# Patient Record
Sex: Male | Born: 1950
Health system: Southern US, Community
[De-identification: ages and names within clinical notes are randomized; demographics above are authoritative.]

## PROBLEM LIST (undated history)

## (undated) DIAGNOSIS — K219 Gastro-esophageal reflux disease without esophagitis: Secondary | ICD-10-CM

## (undated) DIAGNOSIS — M5136 Other intervertebral disc degeneration, lumbar region: Secondary | ICD-10-CM

## (undated) DIAGNOSIS — M1990 Unspecified osteoarthritis, unspecified site: Secondary | ICD-10-CM

## (undated) DIAGNOSIS — E785 Hyperlipidemia, unspecified: Secondary | ICD-10-CM

## (undated) DIAGNOSIS — H409 Unspecified glaucoma: Secondary | ICD-10-CM

## (undated) DIAGNOSIS — Z86010 Personal history of colonic polyps: Secondary | ICD-10-CM

## (undated) DIAGNOSIS — C801 Malignant (primary) neoplasm, unspecified: Secondary | ICD-10-CM

## (undated) DIAGNOSIS — G629 Polyneuropathy, unspecified: Secondary | ICD-10-CM

## (undated) DIAGNOSIS — T7840XA Allergy, unspecified, initial encounter: Secondary | ICD-10-CM

## (undated) DIAGNOSIS — H269 Unspecified cataract: Secondary | ICD-10-CM

## (undated) HISTORY — PX: SMALL INTESTINE SURGERY: SHX150

## (undated) HISTORY — DX: Personal history of colonic polyps: Z86.010

## (undated) HISTORY — PX: TONSILLECTOMY: SUR1361

## (undated) HISTORY — DX: Allergy, unspecified, initial encounter: T78.40XA

## (undated) HISTORY — DX: Unspecified glaucoma: H40.9

## (undated) HISTORY — DX: Other intervertebral disc degeneration, lumbar region: M51.36

## (undated) HISTORY — DX: Hyperlipidemia, unspecified: E78.5

## (undated) HISTORY — DX: Malignant (primary) neoplasm, unspecified: C80.1

## (undated) HISTORY — PX: PROSTATE SURGERY: SHX751

## (undated) HISTORY — PX: COLONOSCOPY: SHX174

## (undated) HISTORY — PX: EYE SURGERY: SHX253

## (undated) HISTORY — DX: Polyneuropathy, unspecified: G62.9

## (undated) HISTORY — DX: Unspecified cataract: H26.9

## (undated) HISTORY — DX: Unspecified osteoarthritis, unspecified site: M19.90

---

## 1980-05-17 HISTORY — PX: VASECTOMY: SHX75

## 2009-05-17 HISTORY — PX: JOINT REPLACEMENT: SHX530

## 2010-05-18 LAB — HM COLONOSCOPY

## 2011-02-19 DIAGNOSIS — Z860100 Personal history of colon polyps, unspecified: Secondary | ICD-10-CM

## 2011-02-19 HISTORY — DX: Personal history of colon polyps, unspecified: Z86.0100

## 2011-02-19 HISTORY — DX: Personal history of colonic polyps: Z86.010

## 2011-03-11 LAB — HM COLONOSCOPY

## 2012-05-17 DIAGNOSIS — T7840XA Allergy, unspecified, initial encounter: Secondary | ICD-10-CM

## 2012-05-17 HISTORY — DX: Allergy, unspecified, initial encounter: T78.40XA

## 2013-05-17 DIAGNOSIS — C801 Malignant (primary) neoplasm, unspecified: Secondary | ICD-10-CM

## 2013-05-17 HISTORY — DX: Malignant (primary) neoplasm, unspecified: C80.1

## 2014-05-17 DIAGNOSIS — H269 Unspecified cataract: Secondary | ICD-10-CM

## 2014-05-17 HISTORY — DX: Unspecified cataract: H26.9

## 2014-08-27 DIAGNOSIS — H4011X3 Primary open-angle glaucoma, severe stage: Secondary | ICD-10-CM | POA: Diagnosis not present

## 2014-10-08 DIAGNOSIS — C61 Malignant neoplasm of prostate: Secondary | ICD-10-CM | POA: Diagnosis not present

## 2014-10-29 ENCOUNTER — Encounter: Payer: Self-pay | Admitting: Physician Assistant

## 2014-10-31 DIAGNOSIS — C61 Malignant neoplasm of prostate: Secondary | ICD-10-CM | POA: Diagnosis not present

## 2014-10-31 DIAGNOSIS — Z8546 Personal history of malignant neoplasm of prostate: Secondary | ICD-10-CM | POA: Diagnosis not present

## 2014-10-31 DIAGNOSIS — N5203 Combined arterial insufficiency and corporo-venous occlusive erectile dysfunction: Secondary | ICD-10-CM | POA: Diagnosis not present

## 2014-11-07 ENCOUNTER — Encounter: Payer: Self-pay | Admitting: Physician Assistant

## 2014-11-07 ENCOUNTER — Ambulatory Visit (INDEPENDENT_AMBULATORY_CARE_PROVIDER_SITE_OTHER): Payer: Medicare Other | Admitting: Physician Assistant

## 2014-11-07 VITALS — BP 110/80 | HR 62 | Temp 98.1°F | Resp 18 | Ht 68.0 in | Wt 184.0 lb

## 2014-11-07 DIAGNOSIS — Z23 Encounter for immunization: Secondary | ICD-10-CM

## 2014-11-07 DIAGNOSIS — H409 Unspecified glaucoma: Secondary | ICD-10-CM | POA: Diagnosis not present

## 2014-11-07 DIAGNOSIS — E785 Hyperlipidemia, unspecified: Secondary | ICD-10-CM

## 2014-11-07 DIAGNOSIS — Z8546 Personal history of malignant neoplasm of prostate: Secondary | ICD-10-CM | POA: Diagnosis not present

## 2014-11-07 HISTORY — DX: Hyperlipidemia, unspecified: E78.5

## 2014-11-07 MED ORDER — SIMVASTATIN 20 MG PO TABS: 20.0000 mg | ORAL_TABLET | Freq: Every day | ORAL | Status: DC

## 2014-11-07 NOTE — Progress Notes (Addendum)
Patient ID: Kyle Wilcox MRN: [id number], DOB: August 27, 1950, 64 y.o. Date of Encounter: @DATE @  Chief Complaint:  Chief Complaint  Patient presents with  . new pt get established    no CPE    HPI: 64 y.o. year old white male  presents with his wife office visit today. They are both here to establish as new patients.  THIS IS ADDED 12/05/2014: I RECEIVED RECORDS FROM [facility]. REVIEWED ALL OF THEM. GAVE TO KIM TO MAKE SURE ALL DIAGNOSIS ARE ABSTRACTED TO PROBLEM LIST AND THAT ALL MEDICATIONS ARE ON MEDICATION LIST.  OTHERWISE, I SAW NO NOTABLE INFORMATION IN THE RECORDS.  RECEIVED LAST LABS DONE 10/03/2013----CMET--NORMAL,  FLP--LDL 121,  THESE ARE THE ONLY LABS FROM THAT DATE ONLY OTHER LABS WE RECEIVED ARE FROM 09/26/2012---CMET--NORMAL, FLP--LDL 100, PSA. NO OTHER LABS.   He just recently moved here from [place] in April 2016.  He was going to [facility]. Says he went there every 3 or 4 months for labs and office visit to monitor his simvastatin. He also had CPE there once a year ---  Last CPE was  September 2015.  He also has a history of prostate cancer and underwent a complete prostatectomy 08/27/2013. Says that he had no chemotherapy or radiation but just the surgical resection. He has continued follow-up with urology every 6 months. Says that he just saw them last week for his second 6 month follow-up visit. They had told him to follow-up 6 months. Today I discussed referring him to a Urologist to establish care here locally and he is agreeable with this.  He also has glaucoma and history of cataracts. He has already established with an eye doctor here. His prior eye doctor actually has an office here so he has been able to follow-up with that same group.  He has no complaints or concerns. Is just here to get established.   Past Medical History  Diagnosis Date  . Allergy 2014  . Cataract 2016  . Glaucoma   . Hyperlipidemia 11/07/2014  . Cancer  2015    prostate cancer     Home Meds: Outpatient Prescriptions Prior to Visit  Medication Sig Dispense Refill  . aspirin (ASPIR-81) 81 MG EC tablet Take 81 mg by mouth daily. Swallow whole.    . Bimatoprost (LUMIGAN OP) Apply 5 mLs to eye 2 (two) times daily.    . Brimonidine Tartrate (ALPHAGAN P OP) Apply 5 mLs to eye 2 (two) times daily.    . brinzolamide (AZOPT) 1 % ophthalmic suspension Place 1 drop into both eyes 2 (two) times daily.    Marland Kitchen gabapentin (NEURONTIN) 600 MG tablet Take 600 mg by mouth 2 (two) times daily.    Marland Kitchen loratadine (CLARITIN) 10 MG tablet Take 10 mg by mouth daily.    . Methylcellulose, Laxative, (FIBER THERAPY PO) Take 0.52 mg by mouth 2 (two) times daily.    . ONE DAILY MULTIPLE VITAMIN PO Take by mouth.    . simvastatin (ZOCOR) 20 MG tablet Take 20 mg by mouth daily.     No facility-administered medications prior to visit.    Allergies: No Known Allergies  History   Social History  . Marital Status: Married    Spouse Name: N/A  . Number of Children: N/A  . Years of Education: N/A   Occupational History  . Not on file.   Social History Main Topics  . Smoking status: Never Smoker   . Smokeless tobacco: Never Used  .  Alcohol Use: 1.2 oz/week    2 Cans of beer per week  . Drug Use: No  . Sexual Activity: Not Currently   Other Topics Concern  . Not on file   Social History Narrative    Family History  Problem Relation Age of Onset  . Arthritis Mother   . Heart disease Mother   . Hyperlipidemia Mother   . Hypertension Mother   . Stroke Mother   . Vision loss Mother   . Arthritis Father   . Depression Father   . Heart disease Father   . Hyperlipidemia Father   . Stroke Father   . Arthritis Sister   . Cancer Sister   . Diabetes Sister   . Hyperlipidemia Sister   . Arthritis Brother   . Diabetes Brother   . Heart disease Brother   . Hyperlipidemia Brother   . Hypertension Brother      Review of Systems:  See HPI for pertinent  ROS. All other ROS negative.    Physical Exam: Blood pressure 110/80, pulse 62, temperature 98.1 F (36.7 C), temperature source Oral, resp. rate 18, height 5\' 8"  (1.727 m), weight 184 lb (83.462 kg)., Body mass index is 27.98 kg/(m^2). General: WNWD WM. Appears in no acute distress. Neck: Supple. No thyromegaly. No lymphadenopathy. No carotid bruits. Lungs: Clear bilaterally to auscultation without wheezes, rales, or rhonchi. Breathing is unlabored. Heart: RRR with S1 S2. No murmurs, rubs, or gallops. Abdomen: Soft, non-tender, non-distended with normoactive bowel sounds. No hepatomegaly. No rebound/guarding. No obvious abdominal masses. Musculoskeletal:  Strength and tone normal for age. Extremities/Skin: Warm and dry. Neuro: Alert and oriented X 3. Moves all extremities spontaneously. Gait is normal. CNII-XII grossly in tact. Psych:  Responds to questions appropriately with a normal affect.     ASSESSMENT AND PLAN:  64 y.o. year old male with    THIS IS ADDED 12/05/2014: I RECEIVED RECORDS FROM [facility]. REVIEWED ALL OF THEM. GAVE TO KIM TO MAKE SURE ALL DIAGNOSIS ARE ABSTRACTED TO PROBLEM LIST AND THAT ALL MEDICATIONS ARE ON MEDICATION LIST.  OTHERWISE, I SAW NO NOTABLE INFORMATION IN THE RECORDS.  RECEIVED LAST LABS DONE 10/03/2013----CMET--NORMAL,  FLP--LDL 121,  THESE ARE THE ONLY LABS FROM THAT DATE ONLY OTHER LABS WE RECEIVED ARE FROM 09/26/2012---CMET--NORMAL, FLP--LDL 100, PSA. NO OTHER LABS.   1. Hyperlipidemia - simvastatin (ZOCOR) 20 MG tablet; Take 1 tablet (20 mg total) by mouth daily.  Dispense: 90 tablet; Refill: 1  2. History of prostate cancer - Ambulatory referral to Urology  3. Glaucoma Managed by Ophthalmology  4. Preventive Care Return around September for complete physical exam. He will come fasting to that appointment. PSA will be followed by Urology  Colorectal Cancer Screening States that last colonoscopy was 2012. Was told to repeat  5 years. Due 2017.  Immunizations: Influenza vaccine------------ States that he receives this each fall Pneumonia vaccine--------- At [place] 11/07/14 states that he has no had no pneumonia vaccine so far. He is now age 38 he is due to start pneumonia vaccines. Discussed this with him today and he is agreeable. Prevnar 13-----Give  Today (11/07/2014--- and give Pneumovax 23 6-12 months later. Tetanus:------------- Pt and wife state that he is overdue for this. However at visit 11/07/14 we are currently out of this. Will give this at next visit. Zostavax----------------------received 04/23/2013  4. Need for vaccination - Pneumococcal conjugate vaccine 13-valent   Signed, [address] [place], [place], [facility] 11/07/2014 11:36 AM

## 2014-11-11 ENCOUNTER — Encounter: Payer: Self-pay | Admitting: Family Medicine

## 2014-11-28 ENCOUNTER — Encounter: Payer: Self-pay | Admitting: Physician Assistant

## 2014-11-29 DIAGNOSIS — N529 Male erectile dysfunction, unspecified: Secondary | ICD-10-CM | POA: Diagnosis not present

## 2014-11-29 DIAGNOSIS — C61 Malignant neoplasm of prostate: Secondary | ICD-10-CM | POA: Diagnosis not present

## 2014-12-24 ENCOUNTER — Encounter: Payer: Self-pay | Admitting: Family Medicine

## 2014-12-24 DIAGNOSIS — M67919 Unspecified disorder of synovium and tendon, unspecified shoulder: Secondary | ICD-10-CM | POA: Insufficient documentation

## 2014-12-24 DIAGNOSIS — M51369 Other intervertebral disc degeneration, lumbar region without mention of lumbar back pain or lower extremity pain: Secondary | ICD-10-CM

## 2014-12-24 DIAGNOSIS — IMO0001 Reserved for inherently not codable concepts without codable children: Secondary | ICD-10-CM | POA: Insufficient documentation

## 2014-12-24 DIAGNOSIS — M5136 Other intervertebral disc degeneration, lumbar region: Secondary | ICD-10-CM

## 2014-12-24 DIAGNOSIS — N529 Male erectile dysfunction, unspecified: Secondary | ICD-10-CM

## 2014-12-24 DIAGNOSIS — G629 Polyneuropathy, unspecified: Secondary | ICD-10-CM

## 2014-12-24 HISTORY — DX: Polyneuropathy, unspecified: G62.9

## 2014-12-24 HISTORY — DX: Other intervertebral disc degeneration, lumbar region: M51.36

## 2014-12-24 HISTORY — DX: Other intervertebral disc degeneration, lumbar region without mention of lumbar back pain or lower extremity pain: M51.369

## 2015-01-15 ENCOUNTER — Encounter: Payer: Self-pay | Admitting: Physician Assistant

## 2015-01-15 ENCOUNTER — Ambulatory Visit (INDEPENDENT_AMBULATORY_CARE_PROVIDER_SITE_OTHER): Payer: Medicare Other | Admitting: Physician Assistant

## 2015-01-15 VITALS — BP 114/80 | HR 68 | Temp 98.1°F | Resp 18 | Wt 184.0 lb

## 2015-01-15 DIAGNOSIS — L255 Unspecified contact dermatitis due to plants, except food: Secondary | ICD-10-CM

## 2015-01-15 MED ORDER — METHYLPREDNISOLONE ACETATE 80 MG/ML IJ SUSP
80.0000 mg | Freq: Once | INTRAMUSCULAR | Status: AC
Start: 2015-01-15 — End: 2015-01-15
  Administered 2015-01-15: 80 mg via INTRAMUSCULAR

## 2015-01-15 NOTE — Addendum Note (Signed)
Addended by: Daylene Posey T on: 01/15/2015 09:33 AM   Modules accepted: Orders

## 2015-01-15 NOTE — Progress Notes (Signed)
    Patient ID: Kyle Wilcox MRN: [id number], DOB: Aug 21, 1950, 64 y.o. Date of Encounter: 01/15/2015, 9:10 AM    Chief Complaint:  Chief Complaint  Patient presents with  . poison ivey    x 2 days on arms, legs and forehead     HPI: 64 y.o. year old white male presents with above.  Says that he has been very allergic to poison ivy/poison oak in the past. Says that he can "just look at it' and break out in itchy rash. Says that he is not sure when or where he came into contact with it this time, but says that he is quite certain that must be what it is.  Has blotches of itchy rash on both of his calves both of his thighs and both of his arms and a spot on his forehead.     Home Meds:   Outpatient Prescriptions Prior to Visit  Medication Sig Dispense Refill  . ALPHAGAN P 0.15 % ophthalmic solution     . aspirin (ASPIR-81) 81 MG EC tablet Take 81 mg by mouth daily. Swallow whole.    . Bimatoprost (LUMIGAN OP) Apply 5 mLs to eye 2 (two) times daily.    . Brimonidine Tartrate (ALPHAGAN P OP) Apply 5 mLs to eye 2 (two) times daily.    . brinzolamide (AZOPT) 1 % ophthalmic suspension Place 1 drop into both eyes 2 (two) times daily.    Marland Kitchen gabapentin (NEURONTIN) 600 MG tablet Take 600 mg by mouth 2 (two) times daily.    Marland Kitchen loratadine (CLARITIN) 10 MG tablet Take 10 mg by mouth daily.    Marland Kitchen LUMIGAN 0.01 % SOLN     . Methylcellulose, Laxative, (FIBER THERAPY PO) Take 0.52 mg by mouth 2 (two) times daily.    . ONE DAILY MULTIPLE VITAMIN PO Take by mouth.    . sildenafil (REVATIO) 20 MG tablet Take 20 mg by mouth 3 (three) times daily.    . simvastatin (ZOCOR) 20 MG tablet Take 1 tablet (20 mg total) by mouth daily. 90 tablet 1   No facility-administered medications prior to visit.    Allergies: No Known Allergies    Review of Systems: See HPI for pertinent ROS. All other ROS negative.    Physical Exam: Blood pressure 114/80, pulse 68, temperature 98.1 F (36.7 C), temperature source  Oral, resp. rate 18, weight 184 lb (83.462 kg)., Body mass index is 27.98 kg/(m^2). General:  WNWD WM. Appears in no acute distress. Neck: Supple. No thyromegaly. No lymphadenopathy. Lungs: Clear bilaterally to auscultation without wheezes, rales, or rhonchi. Breathing is unlabored. Heart: Regular rhythm. No murmurs, rubs, or gallops. Msk:  Strength and tone normal for age. Extremities/Skin: Small approx 2cm diameter "splotches" of pink papules/vessicles---scattered on bilateral calves, bilateral thighs, bilateral arms, and small area on forehead also and on left anterior waist line.  Neuro: Alert and oriented X 3. Moves all extremities spontaneously. Gait is normal. CNII-XII grossly in tact. Psych:  Responds to questions appropriately with a normal affect.     ASSESSMENT AND PLAN:  64 y.o. year old male with  1. Allergic dermatitis due to poison vine He prefers an injection versus pill form of medication. Will give Depo-Medrol 80 mg IM now. Follow up if rash does not resolve.   Signed, [address] [place], [place], [facility] 01/15/2015 9:10 AM

## 2015-01-27 DIAGNOSIS — H4011X3 Primary open-angle glaucoma, severe stage: Secondary | ICD-10-CM | POA: Diagnosis not present

## 2015-03-10 ENCOUNTER — Ambulatory Visit (INDEPENDENT_AMBULATORY_CARE_PROVIDER_SITE_OTHER): Payer: Medicare Other | Admitting: Physician Assistant

## 2015-03-10 ENCOUNTER — Encounter: Payer: Self-pay | Admitting: Family Medicine

## 2015-03-10 ENCOUNTER — Encounter: Payer: Self-pay | Admitting: Physician Assistant

## 2015-03-10 VITALS — BP 102/60 | HR 60 | Temp 98.2°F | Resp 18 | Ht 66.5 in | Wt 183.0 lb

## 2015-03-10 DIAGNOSIS — G6289 Other specified polyneuropathies: Secondary | ICD-10-CM

## 2015-03-10 DIAGNOSIS — Z8546 Personal history of malignant neoplasm of prostate: Secondary | ICD-10-CM | POA: Diagnosis not present

## 2015-03-10 DIAGNOSIS — Z0000 Encounter for general adult medical examination without abnormal findings: Secondary | ICD-10-CM | POA: Diagnosis not present

## 2015-03-10 DIAGNOSIS — E785 Hyperlipidemia, unspecified: Secondary | ICD-10-CM | POA: Diagnosis not present

## 2015-03-10 DIAGNOSIS — N529 Male erectile dysfunction, unspecified: Secondary | ICD-10-CM | POA: Diagnosis not present

## 2015-03-10 DIAGNOSIS — H409 Unspecified glaucoma: Secondary | ICD-10-CM | POA: Diagnosis not present

## 2015-03-10 DIAGNOSIS — Z23 Encounter for immunization: Secondary | ICD-10-CM

## 2015-03-10 DIAGNOSIS — IMO0001 Reserved for inherently not codable concepts without codable children: Secondary | ICD-10-CM

## 2015-03-10 DIAGNOSIS — M67919 Unspecified disorder of synovium and tendon, unspecified shoulder: Secondary | ICD-10-CM

## 2015-03-10 DIAGNOSIS — M5136 Other intervertebral disc degeneration, lumbar region: Secondary | ICD-10-CM

## 2015-03-10 NOTE — Progress Notes (Signed)
Patient ID: Kyle Wilcox MRN: [id number], DOB: Apr 22, 1951 64 y.o. Date of Encounter: 03/10/2015, 10:24 AM    Chief Complaint: Physical (CPE)  HPI: 64 y.o. y/o  white male here for CPE.   THE FOLLOWING IS COPIED FROM HIS INITIAL OV NOTE WITH ME, WHICH WAS 11/07/2014: HPI: 64 y.o. year old white male presents with his wife office visit today. They are both here to establish as new patients.  THIS IS ADDED 12/05/2014: I RECEIVED RECORDS FROM [facility]. REVIEWED ALL OF THEM. GAVE TO KIM TO MAKE SURE ALL DIAGNOSIS ARE ABSTRACTED TO PROBLEM LIST AND THAT ALL MEDICATIONS ARE ON MEDICATION LIST.  OTHERWISE, I SAW NO NOTABLE INFORMATION IN THE RECORDS.  RECEIVED LAST LABS DONE 10/03/2013----CMET--NORMAL, FLP--LDL 121, THESE ARE THE ONLY LABS FROM THAT DATE ONLY OTHER LABS WE RECEIVED ARE FROM 09/26/2012---CMET--NORMAL, FLP--LDL 100, PSA. NO OTHER LABS.   He just recently moved here from [place] in April 2016.  He was going to [facility]. Says he went there every 3 or 4 months for labs and office visit to monitor his simvastatin. He also had CPE there once a year --- Last CPE was September 2015.  He also has a history of prostate cancer and underwent a complete prostatectomy 08/27/2013. Says that he had no chemotherapy or radiation but just the surgical resection. He has continued follow-up with urology every 6 months. Says that he just saw them last week for his second 6 month follow-up visit. They had told him to follow-up 6 months. Today I discussed referring him to a Urologist to establish care here locally and he is agreeable with this.  He also has glaucoma and history of cataracts. He has already established with an eye doctor here. His prior eye doctor actually has an office here so he has been able to follow-up with that same group.  He has no complaints or concerns. Is just here to get established.    03/10/2015: He states that he has gotten him  a part-time job at [employer]. Says-- that way-- he can play golf for free and  also it "gives him some time away from his wife's honeydo list"  Also says that his son went to college at [employer] and then stayed in the [place] area after college. This is why patient and his wife moved here. His son is now married with a 65-year-old child.   Review of Systems: Consitutional: No fever, chills, fatigue, night sweats, lymphadenopathy, or weight changes. Eyes: No visual changes, eye redness, or discharge. ENT/Mouth: Ears: No otalgia, tinnitus, hearing loss, discharge. Nose: No congestion, rhinorrhea, sinus pain, or epistaxis. Throat: No sore throat, post nasal drip, or teeth pain. Cardiovascular: No CP, palpitations, diaphoresis, DOE, edema, orthopnea, PND. Respiratory: No cough, hemoptysis, SOB, or wheezing. Gastrointestinal: No anorexia, dysphagia, reflux, pain, nausea, vomiting, hematemesis, diarrhea, constipation, BRBPR, or melena. Genitourinary: No dysuria, frequency, urgency, hematuria, incontinence, nocturia, decreased urinary stream, discharge, impotence, or testicular pain/masses. Musculoskeletal: No decreased ROM, myalgias, stiffness, joint swelling, or weakness. Skin: No rash, erythema, lesion changes, pain, warmth, jaundice, or pruritis. Neurological: No headache, dizziness, syncope, seizures, tremors, memory loss, coordination problems, or paresthesias. Psychological: No anxiety, depression, hallucinations, SI/HI. Endocrine: No fatigue, polydipsia, polyphagia, polyuria, or known diabetes. All other systems were reviewed and are otherwise negative.  Past Medical History  Diagnosis Date  . Allergy 2014  . Cataract 2016  . Glaucoma   . Hyperlipidemia 11/07/2014  . Cancer ([place]) 2015  prostate cancer  . DDD (degenerative disc disease), lumbar 12/24/2014  . Peripheral neuropathy ([place]) 12/24/2014     Past Surgical History  Procedure Laterality Date  . Eye surgery    .  Joint replacement  2011    total shoulder replacement (Right)  . Vasectomy  1982    Home Meds:  Outpatient Prescriptions Prior to Visit  Medication Sig Dispense Refill  . aspirin (ASPIR-81) 81 MG EC tablet Take 81 mg by mouth daily. Swallow whole.    . Bimatoprost (LUMIGAN OP) Apply 5 mLs to eye 2 (two) times daily.    . Brimonidine Tartrate (ALPHAGAN P OP) Apply 5 mLs to eye 2 (two) times daily.    . brinzolamide (AZOPT) 1 % ophthalmic suspension Place 1 drop into both eyes 2 (two) times daily.    Marland Kitchen gabapentin (NEURONTIN) 600 MG tablet Take 600 mg by mouth 2 (two) times daily.    Marland Kitchen loratadine (CLARITIN) 10 MG tablet Take 10 mg by mouth daily.    . Methylcellulose, Laxative, (FIBER THERAPY PO) Take 0.52 mg by mouth 2 (two) times daily.    . ONE DAILY MULTIPLE VITAMIN PO Take by mouth.    . sildenafil (REVATIO) 20 MG tablet Take 20 mg by mouth 3 (three) times daily.    . simvastatin (ZOCOR) 20 MG tablet Take 1 tablet (20 mg total) by mouth daily. 90 tablet 1  . ALPHAGAN P 0.15 % ophthalmic solution     . LUMIGAN 0.01 % SOLN      No facility-administered medications prior to visit.    Allergies: No Known Allergies  Social History   Social History  . Marital Status: Married    Spouse Name: N/A  . Number of Children: N/A  . Years of Education: N/A   Occupational History  . Not on file.   Social History Main Topics  . Smoking status: Former Smoker    Quit date: 03/09/1981  . Smokeless tobacco: Never Used  . Alcohol Use: 1.2 oz/week    2 Cans of beer per week  . Drug Use: No  . Sexual Activity: Not Currently   Other Topics Concern  . Not on file   Social History Narrative    Family History  Problem Relation Age of Onset  . Arthritis Mother   . Heart disease Mother   . Hyperlipidemia Mother   . Hypertension Mother   . Stroke Mother   . Vision loss Mother   . Arthritis Father   . Depression Father   . Heart disease Father   . Hyperlipidemia Father   . Stroke  Father   . Arthritis Sister   . Cancer Sister   . Diabetes Sister   . Hyperlipidemia Sister   . Arthritis Brother   . Diabetes Brother   . Heart disease Brother   . Hyperlipidemia Brother   . Hypertension Brother     Physical Exam: Blood pressure 102/60, pulse 60, temperature 98.2 F (36.8 C), temperature source Oral, resp. rate 18, height 5' 6.5" (1.689 m), weight 183 lb (83.008 kg).  General: Well developed, well nourished, in no acute distress. HEENT: Normocephalic, atraumatic. Conjunctiva pink, sclera non-icteric. Pupils 2 mm constricting to 1 mm, round, regular, and equally reactive to light and accomodation. EOMI. Internal auditory canal clear. TMs with good cone of light and without pathology. Nasal mucosa pink. Nares are without discharge. No sinus tenderness. Oral mucosa pink. Pharynx without exudate.   Neck: Supple. Trachea midline. No thyromegaly. Full ROM. No  lymphadenopathy. Lungs: Clear to auscultation bilaterally without wheezes, rales, or rhonchi. Breathing is of normal effort and unlabored. Cardiovascular: RRR with S1 S2. No murmurs, rubs, or gallops. Distal pulses 2+ symmetrically. No carotid or abdominal bruits. Abdomen: Soft, non-tender, non-distended with normoactive bowel sounds. No hepatosplenomegaly or masses. No rebound/guarding. No CVA tenderness. No hernias. Rectal: Per Urology Musculoskeletal: Full range of motion and 5/5 strength throughout. Without swelling, atrophy, tenderness, or warmth. Extremities without clubbing, cyanosis, or edema.  Skin: Warm and moist without erythema, ecchymosis, wounds, or rash. The top of his head is bald. He has areas of hyperkeratosis c/w Actinic Keratosis. Also areas of brown "freckles" there. Right chest: Suspicious Nevus--Pt says that this just appeared over the past year. The lesion is approximately 4 mm size and has variable shades of brown coloration. Approximately 4 mm diameter and approximately 3 mm raised. No other  suspicious lesions seen on the skin. Neuro: A+Ox3. CN II-XII grossly intact. Moves all extremities spontaneously. Full sensation throughout. Normal gait. DTR 2+ throughout upper and lower extremities.  Psych:  Responds to questions appropriately with a normal affect.   Assessment/Plan:  64 y.o. y/o white male here for CPE  -1. Visit for preventive health examination  A. Screening Labs: He is not fasting today but states that he can return tomorrow morning fasting for labs. - CBC with Differential/Platelet; Future - COMPLETE METABOLIC PANEL WITH GFR; Future - Lipid panel; Future - TSH; Future - Vit D  25 hydroxy (rtn osteoporosis monitoring); Future   B. Screening For Prostate Cancer: He has history of prostate cancer and is being managed by urology for this. Therefore I will order no PSA here. He reports he had prostate surgery 08/2013  C. Screening For Colorectal Cancer: Last colonoscopy was 03/11/2011. Polyps. Repeat 5 years. Due 2017.   D. Immunizations: Flu---------- he is agreeable to receive influenza vaccine--- given here 03/10/15 Tetanus----he is now 64 and on Medicare and they do not cover this so we'll defer this. Pneumococcal--- at [place] 11/07/14 he reported he had had no pneumonia vaccine at that point. ------------------------------- At that visit we gave Prevnar 13 on 11/07/14. --------------------------------Has not been a full 6 months since that vaccine.  --------------------------------6-12 months after that vaccine we will give the Pneumovax 23.-------------THIS WILL BE DUE AT NEXT OV-------------------- Zostavax------------------- received 04/23/2013   2. Glaucoma Since he has moved to this area, he has established with an ophthalmologist and is seeing a glaucoma specialist here locally----he says that it is a group called [facility] and it is the same group that he saw in the past and they just recently opened a branch here in [place].  3. Hyperlipidemia He  is on simvastatin 20 mg. He is not fasting today but states that he can return tomorrow morning fasting for lab work. - COMPLETE METABOLIC PANEL WITH GFR; Future - Lipid panel; Future  4. History of prostate cancer At his visit with me 11/07/14 I did referral to urology. He states that he has had follow-up with local urologist in [place].  5. Male impotence Per urology see #4 above  6. DDD (degenerative disc disease), lumbar  7. Disorder of rotator cuff, unspecified laterality  8. Other polyneuropathy ([place])  9. Need for prophylactic vaccination and inoculation against influenza - Flu Vaccine QUAD 36+ mos IM  10. Actinic Keratosis----on top of head-----he is to schedule follow-up office visit and will perform cryotherapy. 11. Suspicious Nevus---Chest--------- return for follow-up visit for biopsy.  Subjective:   Patient presents for Medicare  Annual/Subsequent preventive examination.   Review Past Medical/Family/Social: these are all reviewed and documented today.   Risk Factors  Current exercise habits:  He is working at the golf course and plays golf. Also plays with his 69-year-old grandson. Dietary issues discussed: He is aware of low sodium low cholesterol low sugar diet  Cardiac risk factors:  Male, Age, Hyperlipidemia  Depression Screen  (Note: if answer to either of the following is "Yes", a more complete depression screening is indicated)  Over the past two weeks, have you felt down, depressed or hopeless? No Over the past two weeks, have you felt little interest or pleasure in doing things? No Have you lost interest or pleasure in daily life? No Do you often feel hopeless? No Do you cry easily over simple problems? No   Activities of Daily Living  In your present state of health, do you have any difficulty performing the following activities?:  Driving? No  Managing money? No  Feeding yourself? No  Getting from bed to chair? No  Climbing a flight of stairs? No   Preparing food and eating?: No  Bathing or showering? No  Getting dressed: No  Getting to the toilet? No  Using the toilet:No  Moving around from place to place: No  In the past year have you fallen or had a near fall?:No  Are you sexually active? No  Do you have more than one partner? No   Hearing Difficulties: No  Do you often ask people to speak up or repeat themselves? No  Do you experience ringing or noises in your ears? No Do you have difficulty understanding soft or whispered voices? No  Do you feel that you have a problem with memory? No Do you often misplace items? No  Do you feel safe at home? Yes  Cognitive Testing  Alert? Yes Normal Appearance?Yes  Oriented to person? Yes Place? Yes  Time? Yes  Recall of three objects? Yes  Can perform simple calculations? Yes  Displays appropriate judgment?Yes  Can read the correct time from a watch face?Yes   List the Names of Other Physician/Practitioners you currently use:  See HPI  Indicate any recent Medical Services you may have received from other than [facility] providers in the past year (date may be approximate).  See HPI Screening Tests / Date-------------- All of this information is documented above Colonoscopy                     Zostavax  Mammogram---N/A  Influenza Vaccine  Tetanus/tdap    Assessment:    Annual wellness medicare exam   Plan:    During the course of the visit the patient was educated and counseled about appropriate screening and preventive services including:  Screening mammography  Colorectal cancer screening  Shingles vaccine. Prescription given to that she can get the vaccine at the pharmacy or Medicare part D.  Screen + for depression. PHQ- 9 score of 12 (moderate depression). We discussed the options of counseling versus possibly a medication. I encouraged her strongly think about the counseling. She is going through some medical problems currently and her husband is as well Mrs. been very  stressful for her. She says she will think about it. She does have Xanax to use as needed. Though she may benefit from an SSRI for her more depressive type symptoms but she wants to hold off at this time.  I aksed her to please have her cardioloist send records since we have none  on file.  Diet review for nutrition referral? Yes ____ Not Indicated __x__  Patient Instructions (the written plan) was given to the patient.  Medicare Attestation  I have personally reviewed:  The patient's medical and social history  Their use of alcohol, tobacco or illicit drugs  Their current medications and supplements  The patient's functional ability including ADLs,fall risks, home safety risks, cognitive, and hearing and visual impairment  Diet and physical activities  Evidence for depression or mood disorders  The patient's weight, height, BMI, and visual acuity have been recorded in the chart. I have made referrals, counseling, and provided education to the patient based on review of the above and I have provided the patient with a written personalized care plan for preventive services.     He is going to return fasting for lab work. Going to schedule follow-up office visit for cryotherapy to his head and biopsy of suspicious nevus on the chest  Signed:   [address] [place], [place]  03/10/2015 10:24 AM

## 2015-03-11 ENCOUNTER — Other Ambulatory Visit: Payer: Medicare Other

## 2015-03-11 DIAGNOSIS — Z0000 Encounter for general adult medical examination without abnormal findings: Secondary | ICD-10-CM

## 2015-03-11 DIAGNOSIS — E785 Hyperlipidemia, unspecified: Secondary | ICD-10-CM

## 2015-03-11 LAB — CBC WITH DIFFERENTIAL/PLATELET
BASOS ABS: 0 10*3/uL (ref 0.0–0.1)
Basophils Relative: 1 % (ref 0–1)
EOS ABS: 0.1 10*3/uL (ref 0.0–0.7)
EOS PCT: 3 % (ref 0–5)
HEMATOCRIT: 45.2 % (ref 39.0–52.0)
Hemoglobin: 15.5 g/dL (ref 13.0–17.0)
Lymphocytes Relative: 26 % (ref 12–46)
Lymphs Abs: 1.2 10*3/uL (ref 0.7–4.0)
MCH: 30.3 pg (ref 26.0–34.0)
MCHC: 34.3 g/dL (ref 30.0–36.0)
MCV: 88.3 fL (ref 78.0–100.0)
MONO ABS: 0.8 10*3/uL (ref 0.1–1.0)
MPV: 10.3 fL (ref 8.6–12.4)
Monocytes Relative: 18 % — ABNORMAL HIGH (ref 3–12)
NEUTROS PCT: 52 % (ref 43–77)
Neutro Abs: 2.4 10*3/uL (ref 1.7–7.7)
PLATELETS: 194 10*3/uL (ref 150–400)
RBC: 5.12 MIL/uL (ref 4.22–5.81)
RDW: 14.4 % (ref 11.5–15.5)
WBC: 4.7 10*3/uL (ref 4.0–10.5)

## 2015-03-11 LAB — COMPLETE METABOLIC PANEL WITH GFR
ALT: 27 U/L (ref 9–46)
AST: 22 U/L (ref 10–35)
Albumin: 4 g/dL (ref 3.6–5.1)
Alkaline Phosphatase: 76 U/L (ref 40–115)
BUN: 15 mg/dL (ref 7–25)
CALCIUM: 9.6 mg/dL (ref 8.6–10.3)
CHLORIDE: 100 mmol/L (ref 98–110)
CO2: 30 mmol/L (ref 20–31)
CREATININE: 0.97 mg/dL (ref 0.70–1.25)
GFR, Est Non African American: 82 mL/min (ref >=60)
Glucose, Bld: 85 mg/dL (ref 70–99)
POTASSIUM: 4.4 mmol/L (ref 3.5–5.3)
Sodium: 139 mmol/L (ref 135–146)
Total Bilirubin: 0.6 mg/dL (ref 0.2–1.2)
Total Protein: 6.7 g/dL (ref 6.1–8.1)

## 2015-03-11 LAB — LIPID PANEL
CHOL/HDL RATIO: 5 ratio (ref <=5.0)
CHOLESTEROL: 159 mg/dL (ref 125–200)
HDL: 32 mg/dL — ABNORMAL LOW (ref >=40)
LDL CALC: 93 mg/dL (ref <130)
TRIGLYCERIDES: 169 mg/dL — AB (ref <150)
VLDL: 34 mg/dL — AB (ref <30)

## 2015-03-11 LAB — TSH: TSH: 1.829 u[IU]/mL (ref 0.350–4.500)

## 2015-03-12 LAB — VITAMIN D 25 HYDROXY (VIT D DEFICIENCY, FRACTURES): Vit D, 25-Hydroxy: 44 ng/mL (ref 30–100)

## 2015-03-19 ENCOUNTER — Encounter: Payer: Self-pay | Admitting: Physician Assistant

## 2015-03-27 ENCOUNTER — Encounter: Payer: Self-pay | Admitting: Physician Assistant

## 2015-03-27 ENCOUNTER — Ambulatory Visit (INDEPENDENT_AMBULATORY_CARE_PROVIDER_SITE_OTHER): Payer: Medicare Other | Admitting: Physician Assistant

## 2015-03-27 VITALS — BP 126/60 | HR 68 | Temp 98.4°F | Resp 14 | Ht 66.5 in | Wt 183.0 lb

## 2015-03-27 DIAGNOSIS — L821 Other seborrheic keratosis: Secondary | ICD-10-CM | POA: Diagnosis not present

## 2015-03-27 DIAGNOSIS — D229 Melanocytic nevi, unspecified: Secondary | ICD-10-CM

## 2015-03-27 DIAGNOSIS — L570 Actinic keratosis: Secondary | ICD-10-CM | POA: Diagnosis not present

## 2015-03-27 NOTE — Progress Notes (Signed)
    Patient ID: Kyle Wilcox MRN: [id number], DOB: 1951/01/14, 64 y.o. Date of Encounter: 03/27/2015, 9:03 AM    Chief Complaint:  Chief Complaint  Patient presents with  . Actinic Keratosis    on top of head- requesting cryotherapy  . Suspicious Nevus-Chest    biopsy     HPI: 64 y.o. year old white male presents for above.      Home Meds:   Outpatient Prescriptions Prior to Visit  Medication Sig Dispense Refill  . aspirin (ASPIR-81) 81 MG EC tablet Take 81 mg by mouth daily. Swallow whole.    . Bimatoprost (LUMIGAN OP) Apply 5 mLs to eye 2 (two) times daily.    . Brimonidine Tartrate (ALPHAGAN P OP) Apply 5 mLs to eye 2 (two) times daily.    . brinzolamide (AZOPT) 1 % ophthalmic suspension Place 1 drop into both eyes 2 (two) times daily.    Marland Kitchen gabapentin (NEURONTIN) 600 MG tablet Take 600 mg by mouth 2 (two) times daily.    Marland Kitchen loratadine (CLARITIN) 10 MG tablet Take 10 mg by mouth daily.    . Methylcellulose, Laxative, (FIBER THERAPY PO) Take 0.52 mg by mouth 2 (two) times daily.    . ONE DAILY MULTIPLE VITAMIN PO Take by mouth.    . simvastatin (ZOCOR) 20 MG tablet Take 1 tablet (20 mg total) by mouth daily. 90 tablet 1  . sildenafil (REVATIO) 20 MG tablet Take 20 mg by mouth 3 (three) times daily.     No facility-administered medications prior to visit.    Allergies: No Known Allergies    Review of Systems: See HPI for pertinent ROS. All other ROS negative.    Physical Exam: Blood pressure 126/60, pulse 68, temperature 98.4 F (36.9 C), temperature source Oral, resp. rate 14, height 5' 6.5" (1.689 m), weight 183 lb (83.008 kg)., Body mass index is 29.1 kg/(m^2). General: WNWD WM.  Appears in no acute distress. Neck: Supple. No thyromegaly. No lymphadenopathy. Lungs: Clear bilaterally to auscultation without wheezes, rales, or rhonchi. Breathing is unlabored. Heart: Regular rhythm. No murmurs, rubs, or gallops. Msk:  Strength and tone normal for age. Skin:  Top of  head is bald. There are multiple areas of brown pigmentation scattered. There are also multiple areas of hyperkerasosis/scale scattered on top of head.  Anterior Chest, just right of midline: There is a nevus that measures 0.8cm -- borders are assymetric. Color: There are some areas of lighter brown and other areas that are dark brown.  Neuro: Alert and oriented X 3. Moves all extremities spontaneously. Gait is normal. CNII-XII grossly in tact. Psych:  Responds to questions appropriately with a normal affect.     ASSESSMENT AND PLAN:  64 y.o. year old male with  1. Actinic keratosis of scalp Cryotherapy applied to 6 areas of Actinic Keratosis  2. Suspicious nevus Site cleansed with Betadine and then alcohol swab. Site anaesthesized with epi/lidocaine. Shave biopsy performed. Lesion sent to pathology. Only small amount of bleeding which was easily controlled with use of nitric oxide stick. Discussed keeping site clean and dry today and then may shower and gently care for that site. Follow up with patient once we get pathology results. Patient to follow-up if has any problems with this healing. - Pathology   Signed, [facility] [place], [place], [facility] 03/27/2015 9:03 AM

## 2015-03-28 LAB — PATHOLOGY

## 2015-04-13 DIAGNOSIS — J019 Acute sinusitis, unspecified: Secondary | ICD-10-CM | POA: Diagnosis not present

## 2015-05-08 ENCOUNTER — Encounter: Payer: Self-pay | Admitting: Physician Assistant

## 2015-05-09 MED ORDER — GABAPENTIN 600 MG PO TABS: 600.0000 mg | ORAL_TABLET | Freq: Two times a day (BID) | ORAL | Status: DC

## 2015-05-09 NOTE — Telephone Encounter (Signed)
Medication refilled per protocol. 

## 2015-05-27 DIAGNOSIS — H401113 Primary open-angle glaucoma, right eye, severe stage: Secondary | ICD-10-CM | POA: Diagnosis not present

## 2015-05-27 DIAGNOSIS — H401122 Primary open-angle glaucoma, left eye, moderate stage: Secondary | ICD-10-CM | POA: Diagnosis not present

## 2015-05-27 DIAGNOSIS — H25813 Combined forms of age-related cataract, bilateral: Secondary | ICD-10-CM | POA: Diagnosis not present

## 2015-06-04 ENCOUNTER — Other Ambulatory Visit: Payer: Self-pay | Admitting: Family Medicine

## 2015-06-04 DIAGNOSIS — E785 Hyperlipidemia, unspecified: Secondary | ICD-10-CM

## 2015-06-04 MED ORDER — SIMVASTATIN 20 MG PO TABS: 20.0000 mg | ORAL_TABLET | Freq: Every day | ORAL | Status: DC

## 2015-07-31 ENCOUNTER — Ambulatory Visit (INDEPENDENT_AMBULATORY_CARE_PROVIDER_SITE_OTHER): Payer: Medicare Other | Admitting: Physician Assistant

## 2015-07-31 ENCOUNTER — Encounter: Payer: Self-pay | Admitting: Physician Assistant

## 2015-07-31 VITALS — BP 124/66 | HR 80 | Temp 99.0°F | Resp 18 | Wt 189.0 lb

## 2015-07-31 DIAGNOSIS — B349 Viral infection, unspecified: Secondary | ICD-10-CM

## 2015-07-31 DIAGNOSIS — B9789 Other viral agents as the cause of diseases classified elsewhere: Secondary | ICD-10-CM

## 2015-07-31 DIAGNOSIS — J988 Other specified respiratory disorders: Principal | ICD-10-CM

## 2015-07-31 NOTE — Progress Notes (Signed)
Patient ID: Kyle Wilcox MRN: [id number], DOB: 19-Oct-1950, 65 y.o. Date of Encounter: 07/31/2015, 11:39 AM    Chief Complaint:  Chief Complaint  Patient presents with  . sick x 2 days    laryngitis, sinus pressure     HPI: 65 y.o. year old white male presents with above. Says that he is not getting any drainage out of his nose. As he has a little cough secondary to drainage in his throat but no chest congestion. Temperature here is 99 but he has had no other known fevers or chills. Says he has had some hoarseness but no sore throat. Some sinus pressure. Says that they kept his grandson for a day and his grandson had been sick with runny nose, cough and he is quite certain he has gotten sick from him. States that his grandson's symptoms have resolved without antibiotics.     Home Meds:   Outpatient Prescriptions Prior to Visit  Medication Sig Dispense Refill  . aspirin (ASPIR-81) 81 MG EC tablet Take 81 mg by mouth daily. Swallow whole.    . Bimatoprost (LUMIGAN OP) Apply 5 mLs to eye 2 (two) times daily.    . Brimonidine Tartrate (ALPHAGAN P OP) Apply 5 mLs to eye 2 (two) times daily.    . brinzolamide (AZOPT) 1 % ophthalmic suspension Place 1 drop into both eyes 2 (two) times daily.    Marland Kitchen gabapentin (NEURONTIN) 600 MG tablet Take 1 tablet (600 mg total) by mouth 2 (two) times daily. 180 tablet 3  . Methylcellulose, Laxative, (FIBER THERAPY PO) Take 0.52 mg by mouth 2 (two) times daily.    . ONE DAILY MULTIPLE VITAMIN PO Take by mouth.    . sildenafil (REVATIO) 20 MG tablet Take 20 mg by mouth 3 (three) times daily.    . simvastatin (ZOCOR) 20 MG tablet Take 1 tablet (20 mg total) by mouth daily. 90 tablet 1  . loratadine (CLARITIN) 10 MG tablet Take 10 mg by mouth daily. Reported on 07/31/2015     No facility-administered medications prior to visit.    Allergies: No Known Allergies    Review of Systems: See HPI for pertinent ROS. All other ROS negative.    Physical  Exam: Blood pressure 124/66, pulse 80, temperature 99 F (37.2 C), temperature source Oral, resp. rate 18, weight 189 lb (85.73 kg)., Body mass index is 30.05 kg/(m^2). General:  WNWD WM. Appears in no acute distress. HEENT: Normocephalic, atraumatic, eyes without discharge, sclera non-icteric, nares are without discharge. Bilateral auditory canals clear, TM's are without perforation, pearly grey and translucent with reflective cone of light bilaterally. Oral cavity moist, posterior pharynx without exudate, erythema, peritonsillar abscess. No tenderness with percussion to frontal or maxillary sinuses bilaterally.  Neck: Supple. No thyromegaly. No lymphadenopathy. Lungs: Clear bilaterally to auscultation without wheezes, rales, or rhonchi. Breathing is unlabored. Heart: Regular rhythm. No murmurs, rubs, or gallops. Msk:  Strength and tone normal for age. Extremities/Skin: Warm and dry. Neuro: Alert and oriented X 3. Moves all extremities spontaneously. Gait is normal. CNII-XII grossly in tact. Psych:  Responds to questions appropriately with a normal affect.     ASSESSMENT AND PLAN:  65 y.o. year old male with  1. Viral respiratory infection Gave some samples of Norel AD-- To use 1 Q 6 hours prn. For symptom relief.  He is not hoarse at present, but for hoarseness, recommend voice rest. Told him to follow up if fever increases or symptoms worsen significantly or persist greater than  66-10 days.    [address] [place], [place], [facility] 07/31/2015 11:39 AM

## 2015-08-04 ENCOUNTER — Ambulatory Visit (INDEPENDENT_AMBULATORY_CARE_PROVIDER_SITE_OTHER): Payer: Medicare Other | Admitting: Physician Assistant

## 2015-08-04 ENCOUNTER — Encounter: Payer: Self-pay | Admitting: Physician Assistant

## 2015-08-04 VITALS — BP 106/70 | HR 76 | Temp 98.4°F | Resp 18 | Wt 184.0 lb

## 2015-08-04 DIAGNOSIS — B9689 Other specified bacterial agents as the cause of diseases classified elsewhere: Principal | ICD-10-CM

## 2015-08-04 DIAGNOSIS — J988 Other specified respiratory disorders: Secondary | ICD-10-CM | POA: Diagnosis not present

## 2015-08-04 MED ORDER — AZITHROMYCIN 250 MG PO TABS: ORAL_TABLET | ORAL | Status: DC

## 2015-08-04 NOTE — Progress Notes (Signed)
    Patient ID: Kyle Wilcox MRN: [id number], DOB: 11-14-1950, 65 y.o. Date of Encounter: 08/04/2015, 9:37 AM    Chief Complaint:  Chief Complaint  Patient presents with  . still sick from last week    no better     HPI: 65 y.o. year old male presents with above. I reviewed my office note dated 07/31/15. He states that now since then he has been getting a lot of thick dark mucus out of his nose and also is coughing a lot. The cough seems to be secondary to congestion in his chest not just drainage down the throat. Has had no fevers or chills. No sore throat.     Home Meds:   Outpatient Prescriptions Prior to Visit  Medication Sig Dispense Refill  . aspirin (ASPIR-81) 81 MG EC tablet Take 81 mg by mouth daily. Swallow whole.    . Bimatoprost (LUMIGAN OP) Apply 5 mLs to eye 2 (two) times daily.    . Brimonidine Tartrate (ALPHAGAN P OP) Apply 5 mLs to eye 2 (two) times daily.    . brinzolamide (AZOPT) 1 % ophthalmic suspension Place 1 drop into both eyes 2 (two) times daily.    . cetirizine (ZYRTEC) 10 MG tablet Take 10 mg by mouth daily.    Marland Kitchen gabapentin (NEURONTIN) 600 MG tablet Take 1 tablet (600 mg total) by mouth 2 (two) times daily. 180 tablet 3  . Methylcellulose, Laxative, (FIBER THERAPY PO) Take 0.52 mg by mouth 2 (two) times daily.    . ONE DAILY MULTIPLE VITAMIN PO Take by mouth.    . sildenafil (REVATIO) 20 MG tablet Take 20 mg by mouth 3 (three) times daily.    . simvastatin (ZOCOR) 20 MG tablet Take 1 tablet (20 mg total) by mouth daily. 90 tablet 1   No facility-administered medications prior to visit.    Allergies: No Known Allergies    Review of Systems: See HPI for pertinent ROS. All other ROS negative.    Physical Exam: Blood pressure 106/70, pulse 76, temperature 98.4 F (36.9 C), temperature source Oral, resp. rate 18, weight 184 lb (83.462 kg)., Body mass index is 29.26 kg/(m^2). General:  WNWD WM. Appears in no acute distress. HEENT: Normocephalic,  atraumatic, eyes without discharge, sclera non-icteric, nares are without discharge. Bilateral auditory canals clear, TM's are without perforation, pearly grey and translucent with reflective cone of light bilaterally. Oral cavity moist, posterior pharynx without exudate, erythema, peritonsillar abscess.  Neck: Supple. No thyromegaly. No lymphadenopathy. Lungs: Clear bilaterally to auscultation without wheezes, rales, or rhonchi. Breathing is unlabored. Heart: Regular rhythm. No murmurs, rubs, or gallops. Msk:  Strength and tone normal for age. Extremities/Skin: Warm and dry. Neuro: Alert and oriented X 3. Moves all extremities spontaneously. Gait is normal. CNII-XII grossly in tact. Psych:  Responds to questions appropriately with a normal affect.     ASSESSMENT AND PLAN:  65 y.o. year old male with  1. Bacterial respiratory infection He is to take the antibiotic as directed. Follow-up if symptoms do not resolve within 1 week after completion of antibiotic. - azithromycin (ZITHROMAX) 250 MG tablet; Day 1: Take 2 daily. Days 2-5: Take 1 daily.  Dispense: 6 tablet; Refill: 0   Signed, [address] [place], [place], [facility] 08/04/2015 9:37 AM

## 2015-11-26 DIAGNOSIS — H401122 Primary open-angle glaucoma, left eye, moderate stage: Secondary | ICD-10-CM | POA: Diagnosis not present

## 2015-11-26 DIAGNOSIS — H401113 Primary open-angle glaucoma, right eye, severe stage: Secondary | ICD-10-CM | POA: Diagnosis not present

## 2015-11-27 ENCOUNTER — Ambulatory Visit (INDEPENDENT_AMBULATORY_CARE_PROVIDER_SITE_OTHER): Payer: Medicare Other | Admitting: Physician Assistant

## 2015-11-27 ENCOUNTER — Encounter: Payer: Self-pay | Admitting: Physician Assistant

## 2015-11-27 VITALS — BP 122/76 | HR 60 | Temp 98.2°F | Resp 18 | Wt 182.0 lb

## 2015-11-27 DIAGNOSIS — S93401A Sprain of unspecified ligament of right ankle, initial encounter: Secondary | ICD-10-CM

## 2015-11-27 NOTE — Progress Notes (Signed)
Patient ID: Wanye Zheng MRN: [id number], DOB: 1951-02-06, 65 y.o. Date of Encounter: 11/27/2015, 2:39 PM    Chief Complaint:  Chief Complaint  Patient presents with  . left ankle injury 2 weeks ago     HPI: 65 y.o. year old white male presents with above.   Says that one and a half weeks ago he was out playing golf and stepped off the golf cart path--- it was a couple of inches drop. After that, developed pain in his right ankle. Pain from his heel to mid foot arch area. Was limping when he walked. Was still having pain up until yesterday afternoon. However at this point is thinking that he no longer needed this office visit!! Says last night he wrapped it in an Ace wrap and today is noticing no pain--- even when walking and bearing weight!!     Home Meds:   Outpatient Prescriptions Prior to Visit  Medication Sig Dispense Refill  . aspirin (ASPIR-81) 81 MG EC tablet Take 81 mg by mouth daily. Swallow whole.    . Bimatoprost (LUMIGAN OP) Apply 5 mLs to eye 2 (two) times daily.    . brinzolamide (AZOPT) 1 % ophthalmic suspension Place 1 drop into both eyes 2 (two) times daily.    . cetirizine (ZYRTEC) 10 MG tablet Take 10 mg by mouth daily.    Marland Kitchen gabapentin (NEURONTIN) 600 MG tablet Take 1 tablet (600 mg total) by mouth 2 (two) times daily. 180 tablet 3  . Methylcellulose, Laxative, (FIBER THERAPY PO) Take 0.52 mg by mouth 2 (two) times daily.    . ONE DAILY MULTIPLE VITAMIN PO Take by mouth.    . sildenafil (REVATIO) 20 MG tablet Take 20 mg by mouth 3 (three) times daily.    . simvastatin (ZOCOR) 20 MG tablet Take 1 tablet (20 mg total) by mouth daily. 90 tablet 1  . Brimonidine Tartrate (ALPHAGAN P OP) Apply 5 mLs to eye 2 (two) times daily.    Marland Kitchen azithromycin (ZITHROMAX) 250 MG tablet Day 1: Take 2 daily. Days 2-5: Take 1 daily. 6 tablet 0   No facility-administered medications prior to visit.    Allergies: No Known Allergies    Review of Systems: See HPI for pertinent ROS.  All other ROS negative.    Physical Exam: Blood pressure 122/76, pulse 60, temperature 98.2 F (36.8 C), temperature source Oral, resp. rate 18, weight 182 lb (82.555 kg)., Body mass index is 28.94 kg/(m^2). General:  WNWD WM. Appears in no acute distress. Neck: Supple. No thyromegaly. No lymphadenopathy. Lungs: Clear bilaterally to auscultation without wheezes, rales, or rhonchi. Breathing is unlabored. Heart: Regular rhythm. No murmurs, rubs, or gallops. Msk:  Strength and tone normal for age. Right Ankle / Right Foot:  Inspection: There is minimal swelling at the medial malleolus. Otherwise inspection is normal. There is no ecchymosis. Palpation: Says that the area near the medial malleolus is always sore because of past remote injuries. However says that there is some tenderness along the periphery of the posterior arch region towards the heel where those ligaments and tendons insert into those bones.  Range of motion is intact. Does cause some mild discomfort in those same areas--the periphery of the posterior arch region towards the heel where those ligaments and tendons insert. Extremities/Skin: Warm and dry. Neuro: Alert and oriented X 3. Moves all extremities spontaneously. Gait is normal. CNII-XII grossly in tact. Psych:  Responds to questions appropriately with a normal affect.  ASSESSMENT AND PLAN:  65 y.o. year old male with  1. Sprain of ankle, right, initial encounter To continue wrapping with Ace bandages. Elevate and ice. If has pain with weightbearing and needs to avoid weightbearing. If pain reoccurs/increases call and will consider x-ray if needed. As well today I did give him a handout with exercises for ankle sprain. He is to do these exercises to strengthen the ankle after sprain once the pain has resolved. F/U PRN.   [address] [place], [place], [facility] 11/27/2015 2:39 PM

## 2015-12-03 ENCOUNTER — Other Ambulatory Visit: Payer: Self-pay | Admitting: Physician Assistant

## 2015-12-03 ENCOUNTER — Encounter: Payer: Self-pay | Admitting: Physician Assistant

## 2015-12-03 ENCOUNTER — Ambulatory Visit (INDEPENDENT_AMBULATORY_CARE_PROVIDER_SITE_OTHER): Payer: Medicare Other | Admitting: Physician Assistant

## 2015-12-03 ENCOUNTER — Ambulatory Visit
Admission: RE | Admit: 2015-12-03 | Discharge: 2015-12-03 | Disposition: A | Discharge status: Home / Self Care | Payer: Medicare Other | Source: Ambulatory Visit | Attending: Physician Assistant | Admitting: Physician Assistant

## 2015-12-03 VITALS — BP 116/78 | Temp 98.3°F | Wt 184.0 lb

## 2015-12-03 DIAGNOSIS — S99912A Unspecified injury of left ankle, initial encounter: Secondary | ICD-10-CM | POA: Diagnosis not present

## 2015-12-03 DIAGNOSIS — M79672 Pain in left foot: Secondary | ICD-10-CM

## 2015-12-03 DIAGNOSIS — S93402D Sprain of unspecified ligament of left ankle, subsequent encounter: Secondary | ICD-10-CM

## 2015-12-03 DIAGNOSIS — S99922A Unspecified injury of left foot, initial encounter: Secondary | ICD-10-CM | POA: Diagnosis not present

## 2015-12-03 NOTE — Progress Notes (Signed)
Patient ID: Aris Derousse MRN: [id number], DOB: [birth year], 65 y.o. Date of Encounter: 12/03/2015, 3:01 PM    Chief Complaint:  Chief Complaint  Patient presents with  . Office Visit    Left foot     HPI: 65 y.o. year old white male presents with above.   11/27/2015: Says that one and a half weeks ago he was out playing golf and stepped off the golf cart path--- it was a couple of inches drop. After that, developed pain in his right ankle. Pain from his heel to mid foot arch area. Was limping when he walked. Was still having pain up until yesterday afternoon. However at this point is thinking that he no longer needed this office visit!! Says last night he wrapped it in an Ace wrap and today is noticing no pain--- even when walking and bearing weight!! 1. Sprain of ankle, right, initial encounter To continue wrapping with Ace bandages. Elevate and ice. If has pain with weightbearing and needs to avoid weightbearing. If pain reoccurs/increases call and will consider x-ray if needed. As well today I did give him a handout with exercises for ankle sprain. He is to do these exercises to strengthen the ankle after sprain once the pain has resolved. F/U PRN.  12/03/2015: He reports that his foot/ankle remained pretty much pain-free for several days after last office visit. However then says that one morning he woke up and felt pain there again. Says yesterday he did go play golf but he rode a cart and really didn't have a lot of walking. Says that while he was playing golf he was okay but at the end of the day it was slightly worse. Did wrap it and elevated it and applied ice. Asked what his usual activity level is. Says that he usually pretty much stays in the house. Says he lives in a place that does not require any yard work. Asked if he has significant pain with the first steps on his foot first thing in the morning----he says yes----pain is the worst at that time.    Home Meds:     Outpatient Prescriptions Prior to Visit  Medication Sig Dispense Refill  . aspirin (ASPIR-81) 81 MG EC tablet Take 81 mg by mouth daily. Swallow whole.    . Bimatoprost (LUMIGAN OP) Apply 5 mLs to eye 2 (two) times daily.    . brimonidine (ALPHAGAN) 0.2 % ophthalmic solution     . brinzolamide (AZOPT) 1 % ophthalmic suspension Place 1 drop into both eyes 2 (two) times daily.    . cetirizine (ZYRTEC) 10 MG tablet Take 10 mg by mouth daily.    Marland Kitchen gabapentin (NEURONTIN) 600 MG tablet Take 1 tablet (600 mg total) by mouth 2 (two) times daily. 180 tablet 3  . latanoprost (XALATAN) 0.005 % ophthalmic solution     . Methylcellulose, Laxative, (FIBER THERAPY PO) Take 0.52 mg by mouth 2 (two) times daily.    . ONE DAILY MULTIPLE VITAMIN PO Take by mouth.    . sildenafil (REVATIO) 20 MG tablet Take 20 mg by mouth 3 (three) times daily.    . simvastatin (ZOCOR) 20 MG tablet Take 1 tablet (20 mg total) by mouth daily. 90 tablet 1   No facility-administered medications prior to visit.    Allergies: No Known Allergies    Review of Systems: See HPI for pertinent ROS. All other ROS negative.    Physical Exam: Blood pressure 116/78, temperature 98.3 F (36.8 C), weight 184  lb (83.462 kg)., Body mass index is 29.26 kg/(m^2). General:  WNWD WM. Appears in no acute distress. Neck: Supple. No thyromegaly. No lymphadenopathy. Lungs: Clear bilaterally to auscultation without wheezes, rales, or rhonchi. Breathing is unlabored. Heart: Regular rhythm. No murmurs, rubs, or gallops. Msk:  Strength and tone normal for age. Left Ankle / Left Foot:  Inspection: There is no swelling. Inspection is normal. There is no ecchymosis. Palpation: Says that the area near the medial malleolus is always sore because of past remote injuries. There is tenderness along the medial edge of the arch of the foot and medial edge of the arch toward the heel.   Range of motion is intact. Does cause some mild discomfort in those same  areas--the periphery of the posterior arch region towards the heel where those ligaments and tendons insert. Extremities/Skin: Warm and dry. Neuro: Alert and oriented X 3. Moves all extremities spontaneously. Gait is normal. CNII-XII grossly in tact. Psych:  Responds to questions appropriately with a normal affect.     ASSESSMENT AND PLAN:  65 y.o. year old male with   1. Left ankle sprain, subsequent encounter - DG Ankle Complete Left; Future  2. Left foot pain - DG Foot Complete Left; Future  Will obtain x-ray of foot and ankle. I think he definitely has a component of plantar fasciitis. In addition to sprain.  At this point  will obtain x-rays now. At last office visit already gave him handout with exercises regarding ankle sprain. Today I also gave him handout regarding plantar fasciitis. Handout explains the cause of this. Also reviews treatment including heel cups and wearing proper arch support in shoes. Told him not to go barefooted or wear flip-flops. He is to do the stretches on this hand out. Follow-up with him when I get the x-ray reports.    [address] [place], [place], [facility] 12/03/2015 3:01 PM

## 2015-12-22 DIAGNOSIS — M9903 Segmental and somatic dysfunction of lumbar region: Secondary | ICD-10-CM | POA: Diagnosis not present

## 2015-12-22 DIAGNOSIS — M5432 Sciatica, left side: Secondary | ICD-10-CM | POA: Diagnosis not present

## 2015-12-23 DIAGNOSIS — M9903 Segmental and somatic dysfunction of lumbar region: Secondary | ICD-10-CM | POA: Diagnosis not present

## 2015-12-23 DIAGNOSIS — M5432 Sciatica, left side: Secondary | ICD-10-CM | POA: Diagnosis not present

## 2015-12-25 DIAGNOSIS — M9903 Segmental and somatic dysfunction of lumbar region: Secondary | ICD-10-CM | POA: Diagnosis not present

## 2015-12-25 DIAGNOSIS — M5432 Sciatica, left side: Secondary | ICD-10-CM | POA: Diagnosis not present

## 2015-12-29 DIAGNOSIS — M5432 Sciatica, left side: Secondary | ICD-10-CM | POA: Diagnosis not present

## 2015-12-29 DIAGNOSIS — M9903 Segmental and somatic dysfunction of lumbar region: Secondary | ICD-10-CM | POA: Diagnosis not present

## 2015-12-30 DIAGNOSIS — M9903 Segmental and somatic dysfunction of lumbar region: Secondary | ICD-10-CM | POA: Diagnosis not present

## 2015-12-30 DIAGNOSIS — M5432 Sciatica, left side: Secondary | ICD-10-CM | POA: Diagnosis not present

## 2016-01-01 DIAGNOSIS — M9903 Segmental and somatic dysfunction of lumbar region: Secondary | ICD-10-CM | POA: Diagnosis not present

## 2016-01-01 DIAGNOSIS — M5432 Sciatica, left side: Secondary | ICD-10-CM | POA: Diagnosis not present

## 2016-01-05 DIAGNOSIS — M5432 Sciatica, left side: Secondary | ICD-10-CM | POA: Diagnosis not present

## 2016-01-05 DIAGNOSIS — M9903 Segmental and somatic dysfunction of lumbar region: Secondary | ICD-10-CM | POA: Diagnosis not present

## 2016-01-07 DIAGNOSIS — M9903 Segmental and somatic dysfunction of lumbar region: Secondary | ICD-10-CM | POA: Diagnosis not present

## 2016-01-07 DIAGNOSIS — M5432 Sciatica, left side: Secondary | ICD-10-CM | POA: Diagnosis not present

## 2016-01-11 ENCOUNTER — Encounter: Payer: Self-pay | Admitting: Physician Assistant

## 2016-01-12 DIAGNOSIS — M5432 Sciatica, left side: Secondary | ICD-10-CM | POA: Diagnosis not present

## 2016-01-12 DIAGNOSIS — M9903 Segmental and somatic dysfunction of lumbar region: Secondary | ICD-10-CM | POA: Diagnosis not present

## 2016-01-14 DIAGNOSIS — M5432 Sciatica, left side: Secondary | ICD-10-CM | POA: Diagnosis not present

## 2016-01-14 DIAGNOSIS — M9903 Segmental and somatic dysfunction of lumbar region: Secondary | ICD-10-CM | POA: Diagnosis not present

## 2016-02-27 DIAGNOSIS — H401113 Primary open-angle glaucoma, right eye, severe stage: Secondary | ICD-10-CM | POA: Diagnosis not present

## 2016-02-27 DIAGNOSIS — H25813 Combined forms of age-related cataract, bilateral: Secondary | ICD-10-CM | POA: Diagnosis not present

## 2016-03-15 ENCOUNTER — Other Ambulatory Visit: Payer: Medicare Other

## 2016-03-15 DIAGNOSIS — Z0000 Encounter for general adult medical examination without abnormal findings: Secondary | ICD-10-CM | POA: Diagnosis not present

## 2016-03-15 DIAGNOSIS — E785 Hyperlipidemia, unspecified: Secondary | ICD-10-CM | POA: Diagnosis not present

## 2016-03-15 DIAGNOSIS — Z79899 Other long term (current) drug therapy: Secondary | ICD-10-CM

## 2016-03-15 DIAGNOSIS — G619 Inflammatory polyneuropathy, unspecified: Secondary | ICD-10-CM

## 2016-03-15 LAB — TSH: TSH: 2.92 m[IU]/L (ref 0.40–4.50)

## 2016-03-15 LAB — COMPLETE METABOLIC PANEL WITH GFR
ALBUMIN: 4.6 g/dL (ref 3.6–5.1)
ALT: 32 U/L (ref 9–46)
AST: 25 U/L (ref 10–35)
Alkaline Phosphatase: 79 U/L (ref 40–115)
BILIRUBIN TOTAL: 0.6 mg/dL (ref 0.2–1.2)
BUN: 19 mg/dL (ref 7–25)
CALCIUM: 10.4 mg/dL — AB (ref 8.6–10.3)
CO2: 30 mmol/L (ref 20–31)
CREATININE: 1.04 mg/dL (ref 0.70–1.25)
Chloride: 105 mmol/L (ref 98–110)
GFR, EST AFRICAN AMERICAN: 86 mL/min (ref >=60)
GFR, Est Non African American: 74 mL/min (ref >=60)
Glucose, Bld: 88 mg/dL (ref 70–99)
Potassium: 5.3 mmol/L (ref 3.5–5.3)
Sodium: 146 mmol/L (ref 135–146)
TOTAL PROTEIN: 7.3 g/dL (ref 6.1–8.1)

## 2016-03-15 LAB — CBC WITH DIFFERENTIAL/PLATELET
BASOS PCT: 1 %
Basophils Absolute: 56 cells/uL (ref 0–200)
EOS ABS: 168 {cells}/uL (ref 15–500)
Eosinophils Relative: 3 %
HEMATOCRIT: 48.5 % (ref 38.5–50.0)
Hemoglobin: 16.5 g/dL (ref 13.0–17.0)
LYMPHS PCT: 34 %
Lymphs Abs: 1904 cells/uL (ref 850–3900)
MCH: 30.5 pg (ref 27.0–33.0)
MCHC: 34 g/dL (ref 32.0–36.0)
MCV: 89.6 fL (ref 80.0–100.0)
MONO ABS: 616 {cells}/uL (ref 200–950)
MONOS PCT: 11 %
MPV: 10.4 fL (ref 7.5–12.5)
NEUTROS ABS: 2856 {cells}/uL (ref 1500–7800)
Neutrophils Relative %: 51 %
PLATELETS: 228 10*3/uL (ref 140–400)
RBC: 5.41 MIL/uL (ref 4.20–5.80)
RDW: 14.4 % (ref 11.0–15.0)
WBC: 5.6 10*3/uL (ref 3.8–10.8)

## 2016-03-15 LAB — LIPID PANEL
CHOL/HDL RATIO: 5.5 ratio — AB (ref <=5.0)
CHOLESTEROL: 205 mg/dL — AB (ref 125–200)
HDL: 37 mg/dL — ABNORMAL LOW (ref >=40)
LDL Cholesterol: 136 mg/dL — ABNORMAL HIGH (ref <130)
Triglycerides: 159 mg/dL — ABNORMAL HIGH (ref <150)
VLDL: 32 mg/dL — ABNORMAL HIGH (ref <30)

## 2016-03-17 ENCOUNTER — Encounter: Payer: Self-pay | Admitting: Physician Assistant

## 2016-03-17 ENCOUNTER — Ambulatory Visit (INDEPENDENT_AMBULATORY_CARE_PROVIDER_SITE_OTHER): Payer: Medicare Other | Admitting: Physician Assistant

## 2016-03-17 ENCOUNTER — Telehealth: Payer: Self-pay | Admitting: Internal Medicine

## 2016-03-17 VITALS — BP 118/70 | HR 65 | Temp 97.9°F | Resp 16 | Ht 67.0 in | Wt 183.0 lb

## 2016-03-17 DIAGNOSIS — Z8546 Personal history of malignant neoplasm of prostate: Secondary | ICD-10-CM | POA: Diagnosis not present

## 2016-03-17 DIAGNOSIS — Z0000 Encounter for general adult medical examination without abnormal findings: Secondary | ICD-10-CM | POA: Diagnosis not present

## 2016-03-17 DIAGNOSIS — E785 Hyperlipidemia, unspecified: Secondary | ICD-10-CM

## 2016-03-17 DIAGNOSIS — Z23 Encounter for immunization: Secondary | ICD-10-CM | POA: Diagnosis not present

## 2016-03-17 DIAGNOSIS — Z8249 Family history of ischemic heart disease and other diseases of the circulatory system: Secondary | ICD-10-CM | POA: Diagnosis not present

## 2016-03-17 DIAGNOSIS — M722 Plantar fascial fibromatosis: Secondary | ICD-10-CM | POA: Diagnosis not present

## 2016-03-17 NOTE — Addendum Note (Signed)
Addended by: Vonna Kotyk A on: 03/17/2016 12:27 PM   Modules accepted: Orders

## 2016-03-17 NOTE — Progress Notes (Signed)
Patient ID: Woodward Teply MRN: [id number], DOB: 10/23/1950 65 y.o. Date of Encounter: 03/17/2016, 8:51 AM    Chief Complaint: Physical (CPE)  HPI: 65 y.o. y/o  white male here for CPE.    THE FOLLOWING IS COPIED FROM HIS INITIAL OV NOTE WITH ME, WHICH WAS 11/07/2014: HPI: 65 y.o. year old white male presents with his wife office visit today. They are both here to establish as new patients.  THIS IS ADDED 12/05/2014: I RECEIVED RECORDS FROM [facility]. REVIEWED ALL OF THEM. GAVE TO KIM TO MAKE SURE ALL DIAGNOSIS ARE ABSTRACTED TO PROBLEM LIST AND THAT ALL MEDICATIONS ARE ON MEDICATION LIST.  OTHERWISE, I SAW NO NOTABLE INFORMATION IN THE RECORDS.  RECEIVED LAST LABS DONE 10/03/2013----CMET--NORMAL, FLP--LDL 121, THESE ARE THE ONLY LABS FROM THAT DATE ONLY OTHER LABS WE RECEIVED ARE FROM 09/26/2012---CMET--NORMAL, FLP--LDL 100, PSA. NO OTHER LABS.   He just recently moved here from [place] in April 2016.  He was going to [facility]. Says he went there every 3 or 4 months for labs and office visit to monitor his simvastatin. He also had CPE there once a year --- Last CPE was September 2015.  He also has a history of prostate cancer and underwent a complete prostatectomy 08/27/2013. Says that he had no chemotherapy or radiation but just the surgical resection. He has continued follow-up with urology every 6 months. Says that he just saw them last week for his second 6 month follow-up visit. They had told him to follow-up 6 months. Today I discussed referring him to a Urologist to establish care here locally and he is agreeable with this.  He also has glaucoma and history of cataracts. He has already established with an eye doctor here. His prior eye doctor actually has an office here so he has been able to follow-up with that same group.  He has no complaints or concerns. Is just here to get established.    03/10/2015: He states that he has gotten him  a part-time job at [employer]. Says-- that way-- he can play golf for free and  also it "gives him some time away from his wife's honeydo list"  Also says that his son went to college at [employer] and then stayed in the [place] area after college. This is why patient and his wife moved here. His son is now married with a 110-year-old child.    03/17/2016: He states that he has quit working part-time at [employer] course. Says it was too much bending and too much cold weather. Says he has been having problems with plantar fasciitis of his left foot. Says that in the past I have given him a handout with some stretches to do and had also discussed using shoes with proper arch support and heel cups etc. Says that he has been doing all of this but is still having problems with the left plantar fasciitis. He is still seeing Urology for routine follow-up. Also notes that his father had AAA aneurysm and he would like to get screening test for this. No other specific complaints or concerns today. He is still taking his simvastatin as directed. No myalgias or other adverse effects.  Review of Systems: Consitutional: No fever, chills, fatigue, night sweats, lymphadenopathy, or weight changes. Eyes: No visual changes, eye redness, or discharge. ENT/Mouth: Ears: No otalgia, tinnitus, hearing loss, discharge. Nose: No congestion, rhinorrhea, sinus pain, or epistaxis. Throat: No sore throat, post nasal drip, or  teeth pain. Cardiovascular: No CP, palpitations, diaphoresis, DOE, edema, orthopnea, PND. Respiratory: No cough, hemoptysis, SOB, or wheezing. Gastrointestinal: No anorexia, dysphagia, reflux, pain, nausea, vomiting, hematemesis, diarrhea, constipation, BRBPR, or melena. Genitourinary: No dysuria, frequency, urgency, hematuria, incontinence, nocturia, decreased urinary stream, discharge, impotence, or testicular pain/masses. Musculoskeletal: Positive for Plantar Afasciitis. Full ROm in all  joints.  Skin: No rash, erythema, lesion changes, pain, warmth, jaundice, or pruritis. Neurological: No headache, dizziness, syncope, seizures, tremors, memory loss, coordination problems, or paresthesias. Psychological: No anxiety, depression, hallucinations, SI/HI. Endocrine: No fatigue, polydipsia, polyphagia, polyuria, or known diabetes. All other systems were reviewed and are otherwise negative.  Past Medical History:  Diagnosis Date  . Allergy 2014  . Cancer [facility]) 2015   prostate cancer  . Cataract 2016  . DDD (degenerative disc disease), lumbar 12/24/2014  . Glaucoma   . Hyperlipidemia 11/07/2014  . Peripheral neuropathy ([place]) 12/24/2014     Past Surgical History:  Procedure Laterality Date  . EYE SURGERY    . JOINT REPLACEMENT  2011   total shoulder replacement (Right)  . VASECTOMY  1982    Home Meds:  Outpatient Medications Prior to Visit  Medication Sig Dispense Refill  . aspirin (ASPIR-81) 81 MG EC tablet Take 81 mg by mouth daily. Swallow whole.    . Bimatoprost (LUMIGAN OP) Apply 5 mLs to eye 2 (two) times daily.    . brimonidine (ALPHAGAN) 0.2 % ophthalmic solution     . brinzolamide (AZOPT) 1 % ophthalmic suspension Place 1 drop into both eyes 2 (two) times daily.    . cetirizine (ZYRTEC) 10 MG tablet Take 10 mg by mouth daily.    Marland Kitchen gabapentin (NEURONTIN) 600 MG tablet Take 1 tablet (600 mg total) by mouth 2 (two) times daily. 180 tablet 3  . latanoprost (XALATAN) 0.005 % ophthalmic solution     . Methylcellulose, Laxative, (FIBER THERAPY PO) Take 0.52 mg by mouth 2 (two) times daily.    . ONE DAILY MULTIPLE VITAMIN PO Take by mouth.    . sildenafil (REVATIO) 20 MG tablet Take 20 mg by mouth 3 (three) times daily.    . simvastatin (ZOCOR) 20 MG tablet TAKE 1 TABLET DAILY 90 tablet 1   No facility-administered medications prior to visit.     Allergies: No Known Allergies  Social History   Social History  . Marital status: Married    Spouse name: N/A  .  Number of children: N/A  . Years of education: N/A   Occupational History  . Not on file.   Social History Main Topics  . Smoking status: Former Smoker    Quit date: 03/09/1981  . Smokeless tobacco: Never Used  . Alcohol use 1.2 oz/week    2 Cans of beer per week  . Drug use: No  . Sexual activity: Not Currently   Other Topics Concern  . Not on file   Social History Narrative  . No narrative on file    Family History  Problem Relation Age of Onset  . Arthritis Mother   . Heart disease Mother   . Hyperlipidemia Mother   . Hypertension Mother   . Stroke Mother   . Vision loss Mother   . Arthritis Father   . Depression Father   . Heart disease Father   . Hyperlipidemia Father   . Stroke Father   . Arthritis Sister   . Cancer Sister   . Diabetes Sister   . Hyperlipidemia Sister   . Arthritis Brother   .  Diabetes Brother   . Heart disease Brother   . Hyperlipidemia Brother   . Hypertension Brother     Physical Exam: Blood pressure 118/70, pulse 65, temperature 97.9 F (36.6 C), temperature source Oral, resp. rate 16, height 5\' 7"  (1.702 m), weight 183 lb (83 kg), SpO2 98 %.  General: Well developed, well nourished, in no acute distress. HEENT: Normocephalic, atraumatic. Conjunctiva pink, sclera non-icteric. Pupils 2 mm constricting to 1 mm, round, regular, and equally reactive to light and accomodation. EOMI. Internal auditory canal clear. TMs with good cone of light and without pathology. Nasal mucosa pink. Nares are without discharge. No sinus tenderness. Oral mucosa pink. Pharynx without exudate.   Neck: Supple. Trachea midline. No thyromegaly. Full ROM. No lymphadenopathy. Lungs: Clear to auscultation bilaterally without wheezes, rales, or rhonchi. Breathing is of normal effort and unlabored. Cardiovascular: RRR with S1 S2. No murmurs, rubs, or gallops. Distal pulses 2+ symmetrically. No carotid or abdominal bruits. Abdomen: Soft, non-tender, non-distended with  normoactive bowel sounds. No hepatosplenomegaly or masses. No rebound/guarding. No CVA tenderness. No hernias. Rectal: Per Urology Musculoskeletal: Full range of motion and 5/5 strength throughout. Without swelling, atrophy, tenderness, or warmth. Extremities without clubbing, cyanosis, or edema.  Skin: Warm and moist without erythema, ecchymosis, wounds, or rash. Neuro: A+Ox3. CN II-XII grossly intact. Moves all extremities spontaneously. Full sensation throughout. Normal gait. DTR 2+ throughout upper and lower extremities.  Psych:  Responds to questions appropriately with a normal affect.   Assessment/Plan:  65 y.o. y/o white male here for CPE  -1. Visit for preventive health examination  A. Screening Labs: He recently came and had fasting labs.  Reviewed all of these results with him today. All of these labs are good.  B. Screening For Prostate Cancer: He has history of prostate cancer and is being managed by urology for this. Therefore I will order no PSA here. He reports he had prostate surgery 08/2013  C. Screening For Colorectal Cancer: Last colonoscopy was 03/11/2011. Polyps. Repeat 5 years. Due 2017.  At CPE 03/17/2016 I placed referral to GI (He says prior colonoscopy was performed prior to moving here--will establish with a new GI here )  D. Immunizations: Flu---------- he is agreeable to receive influenza vaccine--- given here 03/10/15. 03/17/2016--he reports that he already received this at the local pharmacy Tetanus----he is now 43 and on Medicare and they do not cover this so we'll defer this. Pneumococcal--- at [place] 11/07/14 he reported he had had no pneumonia vaccine at that point. ------------------------------- At that visit we gave Prevnar 13 on 11/07/14. --------------------------------Has not been a full 6 months since that vaccine.  --------------------------------Pneumovax 23----Given here 03/17/2016 Zostavax------------------- received 04/23/2013  Medicare annual  wellness visit, subsequent At visit 03/17/2016 I have placed the following referral/orders: - Ambulatory referral to Gastroenterology - US Aorta Initial Medicare Screen; Future   Family history of abdominal aortic aneurysm (AAA) 03/17/2016---he reports that his father had abdominal aortic aneurysm. Will obtain screening for this. - US Aorta Initial Medicare Screen; Future  Plantar fasciitis of left foot 03/17/2016---He has been compliant with all of the treatments that I have provided that is still having symptoms. At this time we'll refer him to podiatry. - Ambulatory referral to Podiatry   Glaucoma Since he has moved to this area, he has established with an ophthalmologist and is seeing a glaucoma specialist here locally----he says that it is a group called [facility] and it is the same group that he saw in the past and they  just recently opened a branch here in [place]. 03/17/2016--- continues to follow-up with local ophthalmologist  Hyperlipidemia 03/17/2016---He is on simvastatin 20 mg. 03/17/2016-----He had recent FLP and LFTs which were good. Continue current dose.  History of prostate cancer At his visit with me 11/07/14 I did referral to urology. He states that he has had follow-up with local urologist in [place]. 03/17/2016----he continues to follow-up with urology routinely. Male impotence Per urology see #4 above  DDD (degenerative disc disease), lumbar  Disorder of rotator cuff, unspecified laterality  Other polyneuropathy ([place])   Subjective:   Patient presents for Medicare Annual/Subsequent preventive examination.   Review Past Medical/Family/Social: these are all reviewed and documented today.   Risk Factors  Current exercise habits:  He is working at the golf course and plays golf. Also plays with his 68-year-old grandson. Dietary issues discussed: He is aware of low sodium low cholesterol low sugar diet  Cardiac risk factors:  Male, Age,  Hyperlipidemia  Depression Screen  (Note: if answer to either of the following is "Yes", a more complete depression screening is indicated)  Over the past two weeks, have you felt down, depressed or hopeless? No Over the past two weeks, have you felt little interest or pleasure in doing things? No Have you lost interest or pleasure in daily life? No Do you often feel hopeless? No Do you cry easily over simple problems? No   Activities of Daily Living  In your present state of health, do you have any difficulty performing the following activities?:  Driving? No  Managing money? No  Feeding yourself? No  Getting from bed to chair? No  Climbing a flight of stairs? No  Preparing food and eating?: No  Bathing or showering? No  Getting dressed: No  Getting to the toilet? No  Using the toilet:No  Moving around from place to place: No  In the past year have you fallen or had a near fall?:No  Are you sexually active? No  Do you have more than one partner? No   Hearing Difficulties: No  Do you often ask people to speak up or repeat themselves? No  Do you experience ringing or noises in your ears? No Do you have difficulty understanding soft or whispered voices? No  Do you feel that you have a problem with memory? No Do you often misplace items? No  Do you feel safe at home? Yes  Cognitive Testing  Alert? Yes Normal Appearance?Yes  Oriented to person? Yes Place? Yes  Time? Yes  Recall of three objects? Yes  Can perform simple calculations? Yes  Displays appropriate judgment?Yes  Can read the correct time from a watch face?Yes   List the Names of Other Physician/Practitioners you currently use:  See HPI  Indicate any recent Medical Services you may have received from other than [facility] providers in the past year (date may be approximate).  See HPI Screening Tests / Date-------------- All of this information is documented above Colonoscopy                     Zostavax   Mammogram---N/A  Influenza Vaccine  Tetanus/tdap    Assessment:    Annual wellness medicare exam   Plan:    During the course of the visit the patient was educated and counseled about appropriate screening and preventive services including:  Screening mammography  Colorectal cancer screening  Shingles vaccine. Prescription given to that she can get the vaccine at the pharmacy or Medicare part  D.  Screen + for depression. PHQ- 9 score of 12 (moderate depression). We discussed the options of counseling versus possibly a medication. I encouraged her strongly think about the counseling. She is going through some medical problems currently and her husband is as well Mrs. been very stressful for her. She says she will think about it. She does have Xanax to use as needed. Though she may benefit from an SSRI for her more depressive type symptoms but she wants to hold off at this time.  I aksed her to please have her cardioloist send records since we have none on file.  Diet review for nutrition referral? Yes ____ Not Indicated __x__  Patient Instructions (the written plan) was given to the patient.  Medicare Attestation  I have personally reviewed:  The patient's medical and social history  Their use of alcohol, tobacco or illicit drugs  Their current medications and supplements  The patient's functional ability including ADLs,fall risks, home safety risks, cognitive, and hearing and visual impairment  Diet and physical activities  Evidence for depression or mood disorders  The patient's weight, height, BMI, and visual acuity have been recorded in the chart. I have made referrals, counseling, and provided education to the patient based on review of the above and I have provided the patient with a written personalized care plan for preventive services.     He is going to return fasting for lab work. Going to schedule follow-up office visit for cryotherapy to his head and biopsy of  suspicious nevus on the chest  Signed:   [address] [place], [place]  03/17/2016 8:51 AM

## 2016-03-19 ENCOUNTER — Encounter: Payer: Self-pay | Admitting: Internal Medicine

## 2016-03-24 ENCOUNTER — Ambulatory Visit (INDEPENDENT_AMBULATORY_CARE_PROVIDER_SITE_OTHER): Payer: Medicare Other | Admitting: Physician Assistant

## 2016-03-24 ENCOUNTER — Encounter: Payer: Self-pay | Admitting: Physician Assistant

## 2016-03-24 VITALS — BP 118/82 | HR 51 | Temp 97.8°F | Resp 16 | Ht 67.0 in | Wt 185.0 lb

## 2016-03-24 DIAGNOSIS — L918 Other hypertrophic disorders of the skin: Secondary | ICD-10-CM | POA: Diagnosis not present

## 2016-03-24 DIAGNOSIS — L919 Hypertrophic disorder of the skin, unspecified: Secondary | ICD-10-CM | POA: Diagnosis not present

## 2016-03-24 MED ORDER — GABAPENTIN 600 MG PO TABS: 600.0000 mg | ORAL_TABLET | Freq: Two times a day (BID) | ORAL | 3 refills | Status: DC

## 2016-03-24 MED ORDER — SIMVASTATIN 20 MG PO TABS: 20.0000 mg | ORAL_TABLET | Freq: Every day | ORAL | 1 refills | Status: DC

## 2016-03-24 NOTE — Addendum Note (Signed)
Addended by: Dena Billet on: 03/24/2016 10:00 AM   Modules accepted: Orders

## 2016-03-24 NOTE — Progress Notes (Signed)
    Patient ID: Cheskel Quispe MRN: [id number], DOB: 1950/09/21, 65 y.o. Date of Encounter: 03/24/2016, 8:20 AM    Chief Complaint:  Chief Complaint  Patient presents with  . skin tag on right ear     HPI: 65 y.o. year old male presents for above.   He was recently here for his physical. At that visit he reported he had a skin tag on the back of his right ear that was very irritating. Returns today to have this removed. No other complaints or concerns today.     Home Meds:   Outpatient Medications Prior to Visit  Medication Sig Dispense Refill  . aspirin (ASPIR-81) 81 MG EC tablet Take 81 mg by mouth daily. Swallow whole.    . Bimatoprost (LUMIGAN OP) Apply 5 mLs to eye 2 (two) times daily.    . brimonidine (ALPHAGAN) 0.2 % ophthalmic solution     . brinzolamide (AZOPT) 1 % ophthalmic suspension Place 1 drop into both eyes 2 (two) times daily.    . cetirizine (ZYRTEC) 10 MG tablet Take 10 mg by mouth daily.    Marland Kitchen gabapentin (NEURONTIN) 600 MG tablet Take 1 tablet (600 mg total) by mouth 2 (two) times daily. 180 tablet 3  . latanoprost (XALATAN) 0.005 % ophthalmic solution     . ONE DAILY MULTIPLE VITAMIN PO Take by mouth.    . simvastatin (ZOCOR) 20 MG tablet TAKE 1 TABLET DAILY 90 tablet 1  . Methylcellulose, Laxative, (FIBER THERAPY PO) Take 0.52 mg by mouth 2 (two) times daily.    . sildenafil (REVATIO) 20 MG tablet Take 20 mg by mouth 3 (three) times daily.     No facility-administered medications prior to visit.     Allergies: No Known Allergies    Review of Systems: See HPI for pertinent ROS. All other ROS negative.    Physical Exam: Blood pressure 118/82, pulse (!) 51, temperature 97.8 F (36.6 C), temperature source Oral, resp. rate 16, height 5\' 7"  (1.702 m), weight 185 lb (83.9 kg), SpO2 98 %., Body mass index is 28.98 kg/m. General:  WNWD WM. Appears in no acute distress. Neck: Supple. No thyromegaly. No lymphadenopathy. Lungs: Clear bilaterally to auscultation  without wheezes, rales, or rhonchi. Breathing is unlabored. Heart: Regular rhythm. No murmurs, rubs, or gallops. Msk:  Strength and tone normal for age. Skin: On the posterior aspect of the right ear, there is a skin tag that measures approx 0.4cm. Neuro: Alert and oriented X 3. Moves all extremities spontaneously. Gait is normal. CNII-XII grossly in tact. Psych:  Responds to questions appropriately with a normal affect.     ASSESSMENT AND PLAN:  65 y.o. year old male with  1. Skin tag of ear Sight was cleansed with Betadine and alcohol. Skin tag was excised using scissors. Skin tag is sent to pathology. Very minimal bleeding which was controlled with application of pressure gauze.   Signed, [address] [place], [place], [facility] 03/24/2016 8:20 AM

## 2016-03-25 LAB — PATHOLOGY

## 2016-03-29 ENCOUNTER — Ambulatory Visit
Admission: RE | Admit: 2016-03-29 | Discharge: 2016-03-29 | Disposition: A | Discharge status: Home / Self Care | Payer: Medicare Other | Source: Ambulatory Visit | Attending: Physician Assistant | Admitting: Physician Assistant

## 2016-03-29 DIAGNOSIS — Z8249 Family history of ischemic heart disease and other diseases of the circulatory system: Secondary | ICD-10-CM

## 2016-03-29 DIAGNOSIS — Z8489 Family history of other specified conditions: Secondary | ICD-10-CM | POA: Diagnosis not present

## 2016-03-29 DIAGNOSIS — Z136 Encounter for screening for cardiovascular disorders: Secondary | ICD-10-CM | POA: Diagnosis not present

## 2016-03-29 DIAGNOSIS — Z0000 Encounter for general adult medical examination without abnormal findings: Secondary | ICD-10-CM

## 2016-03-30 ENCOUNTER — Encounter: Payer: Self-pay | Admitting: Podiatry

## 2016-03-30 ENCOUNTER — Other Ambulatory Visit: Payer: Self-pay | Admitting: *Deleted

## 2016-03-30 ENCOUNTER — Ambulatory Visit (INDEPENDENT_AMBULATORY_CARE_PROVIDER_SITE_OTHER): Payer: Medicare Other | Admitting: Podiatry

## 2016-03-30 ENCOUNTER — Ambulatory Visit (INDEPENDENT_AMBULATORY_CARE_PROVIDER_SITE_OTHER): Payer: Medicare Other

## 2016-03-30 VITALS — BP 139/65 | HR 56 | Resp 16

## 2016-03-30 DIAGNOSIS — M722 Plantar fascial fibromatosis: Secondary | ICD-10-CM

## 2016-03-30 MED ORDER — METHYLPREDNISOLONE 4 MG PO TBPK: ORAL_TABLET | ORAL | 0 refills | Status: DC

## 2016-03-30 MED ORDER — MELOXICAM 15 MG PO TABS: 15.0000 mg | ORAL_TABLET | Freq: Every day | ORAL | 3 refills | Status: DC

## 2016-03-30 NOTE — Patient Instructions (Addendum)
[facility] Ask your health care provider which exercises are safe for you. Do exercises exactly as told by your health care provider and adjust them as directed. It is normal to feel mild stretching, pulling, tightness, or discomfort as you do these exercises, but you should stop right away if you feel sudden pain or your pain gets worse. Do not begin these exercises until told by your health care provider. Stretching and range of motion exercises These exercises warm up your muscles and joints and improve the movement and flexibility of your ankle. These exercises also help to relieve pain, numbness, and tingling. Exercise A: Standing wall calf stretch, knee straight 1. Stand with your hands against a wall. 2. Extend your __________ leg behind you and bend your front knee slightly. Keep both of your heels on the floor. 3. Point the toes of your back foot slightly inward. 4. Keeping your heels on the floor and your back knee straight, shift your weight toward the wall. Do not allow your back to arch. You should feel a gentle stretch in your calf. 5. Hold this position for seconds. Repeat __________ times. Complete this stretch __________ times per day. Exercise B: Standing wall calf stretch, knee bent 1. Stand with your hands against a wall. 2. Extend your __________ leg behind you, and bend your front knee slightly. Keep both of your heels on the floor. 3. Point the toes of your back foot slightly inward. 4. Keeping your heels on the floor, unlock your back knee so that it is bent. You should feel a gentle stretch deep in your calf. 5. Hold this position for __________ seconds. Repeat __________ times. Complete this stretch __________ times per day. Strengthening exercises These exercises build strength and control of your ankle. Endurance is the ability to use your muscles for a long time, even after they get tired. Exercise C: Plantar flexion with band 1. Sit on the floor with  your __________ leg extended. You may put a pillow under your calf to give your foot more room to move. 2. Loop a rubber exercise band or tube around the ball of your __________ foot. The ball of your foot is on the walking surface, right under your toes. The band or tube should be slightly tense when your foot is relaxed. If the band or tube slips, you can put on your shoe or put a washcloth between the band and your foot to help it stay in place. 3. Slowly point your toes downward, pushing them away from you. 4. Hold this position for __________ seconds. 5. Slowly release the tension in the band or tube, controlling smoothly until your foot is back to the starting position. Repeat __________ times. Complete this exercise __________ times per day. Exercise D: Heel raise with eccentric lower 1. Stand on a step with the balls of your feet. The ball of your foot is on the walking surface, right under your toes.  Do not put your heels on the step.  For balance, rest your hands on the wall or on a railing. 2. Rise up onto the balls of your feet. 3. Keeping your heels up, shift all of your weight to your __________ leg and pick up your other leg. 4. Slowly lower your __________ leg so your heel drops below the level of the step. 5. Put down your foot. If told by your health care provider, build up to:  3 sets of 15 repetitions while keeping your knees straight.  3   while keeping your knees straight.  3 sets of 15 repetitions while keeping your knees bent as far as told by your health care provider. Complete this exercise __________ times per day. If this exercise is too easy, try doing it while wearing a backpack with weights in it. Balance exercises These exercises improve or maintain your balance. Balance is important in preventing falls. Exercise E: Single leg stand  1. Without shoes, stand near a railing or in a door frame. Hold on to the railing or door frame as needed. 2. Stand on your __________ foot. Keep your  big toe down on the floor and try to keep your arch lifted. 3. Hold this position for __________ seconds. Repeat __________ times. Complete this exercise __________ times per day. If this exercise is too easy, you can try it with your eyes closed or while standing on a pillow. This information is not intended to replace advice given to you by your health care provider. Make sure you discuss any questions you have with your health care provider. Document Released: 12/02/2004 Document Revised: 01/08/2016 Document Reviewed: 01/07/2015 Elsevier Interactive Patient Education  2017 [employer] Plantar Fasciitis Rehab Ask your health care provider which exercises are safe for you. Do exercises exactly as told by your health care provider and adjust them as directed. It is normal to feel mild stretching, pulling, tightness, or discomfort as you do these exercises, but you should stop right away if you feel sudden pain or your pain gets worse. Do not begin these exercises until told by your health care provider. Stretching and range of motion exercises These exercises warm up your muscles and joints and improve the movement and flexibility of your foot. These exercises also help to relieve pain. Exercise A: Plantar fascia stretch   6. Sit with your left / right leg crossed over your opposite knee. 7. Hold your heel with one hand with that thumb near your arch. With your other hand, hold your toes and gently pull them back toward the top of your foot. You should feel a stretch on the bottom of your toes or your foot or both. 8. Hold this stretch for__________ seconds. 9. Slowly release your toes and return to the starting position. Repeat __________ times. Complete this exercise __________ times a day. Exercise B: Gastroc, standing   6. Stand with your hands against a wall. 7. Extend your left / right leg behind you, and bend your front knee slightly. 8. Keeping your heels on the floor and keeping  your back knee straight, shift your weight toward the wall without arching your back. You should feel a gentle stretch in your left / right calf. 9. Hold this position for __________ seconds. Repeat __________ times. Complete this exercise __________ times a day. Exercise C: Soleus, standing  6. Stand with your hands against a wall. 7. Extend your left / right leg behind you, and bend your front knee slightly. 8. Keeping your heels on the floor, bend your back knee and slightly shift your weight over the back leg. You should feel a gentle stretch deep in your calf. 9. Hold this position for __________ seconds. Repeat __________ times. Complete this exercise __________ times a day. Exercise D: Gastrocsoleus, standing  1. Stand with the ball of your left / right foot on a step. The ball of your foot is on the walking surface, right under your toes. 2. Keep your other foot firmly on the same step. 3. Hold onto the wall or   a railing for balance. 4. Slowly lift your other foot, allowing your body weight to press your heel down over the edge of the step. You should feel a stretch in your left / right calf. 5. Hold this position for __________ seconds. 6. Return both feet to the step. 7. Repeat this exercise with a slight bend in your left / right knee. Repeat __________ times with your left / right knee straight and __________ times with your left / right knee bent. Complete this exercise __________ times a day. Balance exercise This exercise builds your balance and strength control of your arch to help take pressure off your plantar fascia. Exercise E: Single leg stand  1. Without shoes, stand near a railing or in a doorway. You may hold onto the railing or door frame as needed. 2. Stand on your left / right foot. Keep your big toe down on the floor and try to keep your arch lifted. Do not let your foot roll inward. 3. Hold this position for __________ seconds. 4. If this exercise is too easy, you  can try it with your eyes closed or while standing on a pillow. Repeat __________ times. Complete this exercise __________ times a day. This information is not intended to replace advice given to you by your health care provider. Make sure you discuss any questions you have with your health care provider. Document Released: 05/03/2005 Document Revised: 01/06/2016 Document Reviewed: 03/17/2015 Elsevier Interactive Patient Education  2017 [employer] Inc[employer]  

## 2016-03-30 NOTE — Progress Notes (Signed)
   Subjective:    Patient ID: Kyle Wilcox, male    DOB: August 14, 1950, 65 y.o.   MRN: [id number]  HPI: He presents today chief complaint of plantar heel pain left. He states that he stepped off of heart Playing golf in July 2017 which then syndrome developed into pain in the morning he states that walking seems to make it worse. His primary care provider said that he had plantar fasciitis gave him exercises to try he also tried multiple cushions and straps in his shoes and he tried massaging with a ball. He stopped golfing and he's been wearing Dr. Felicie Morn insoles. He states that nothing seems to help it really only seems to be getting worse.    Review of Systems  HENT: Positive for hearing loss.   Eyes: Positive for redness.  Musculoskeletal: Positive for gait problem.  All other systems reviewed and are negative.      Objective:   Physical Exam: Vital signs are stable alert and oriented 3. Pulses are palpable. Neurologic sensorium is intact. Deep tendon reflexes are intact. Muscle strength was 5 over 5 dorsiflexion plantar flexors and inverters everters all intrinsic musculature is intact. Orthopedic evaluation demonstrates pain on palpation medial calcaneal tubercle of the left heel. Radiographs reviewed which were previously taken by PCP didn't demonstrate plantar distally or any calcaneal heel spur with soft tissue increased density of the plantar fascia calcaneal insertion site. I see no fractures of the calcaneus. Cutaneous evaluation demonstrates supple well-hydrated cutis no erythema edema cellulitis drainage or odor. No open lesions or wounds.        Assessment & Plan:  Chronic proximal plantar fasciitis left foot.  Plan: Started him on a Medrol Dosepak to be followed by meloxicam. He injected his left heel today with Kenalog and local anesthetic placed in the plantar fascia brace and a night splint. Discussed appropriate shoe gear stretching exercises ice therapy shoe gear  modifications. We discussed etiology and pathology conservative versus surgical therapies and I will follow-up with him in 1 month.

## 2016-03-31 DIAGNOSIS — C61 Malignant neoplasm of prostate: Secondary | ICD-10-CM | POA: Diagnosis not present

## 2016-03-31 DIAGNOSIS — N5231 Erectile dysfunction following radical prostatectomy: Secondary | ICD-10-CM | POA: Diagnosis not present

## 2016-04-22 ENCOUNTER — Other Ambulatory Visit: Payer: Self-pay

## 2016-04-25 ENCOUNTER — Encounter: Payer: Self-pay | Admitting: Physician Assistant

## 2016-04-25 ENCOUNTER — Other Ambulatory Visit: Payer: Self-pay | Admitting: Physician Assistant

## 2016-04-26 DIAGNOSIS — M9901 Segmental and somatic dysfunction of cervical region: Secondary | ICD-10-CM | POA: Diagnosis not present

## 2016-04-26 DIAGNOSIS — M5411 Radiculopathy, occipito-atlanto-axial region: Secondary | ICD-10-CM | POA: Diagnosis not present

## 2016-04-27 ENCOUNTER — Encounter: Payer: Self-pay | Admitting: Podiatry

## 2016-04-27 ENCOUNTER — Ambulatory Visit (INDEPENDENT_AMBULATORY_CARE_PROVIDER_SITE_OTHER): Payer: Medicare Other | Admitting: Podiatry

## 2016-04-27 DIAGNOSIS — M722 Plantar fascial fibromatosis: Secondary | ICD-10-CM

## 2016-04-27 DIAGNOSIS — M5411 Radiculopathy, occipito-atlanto-axial region: Secondary | ICD-10-CM | POA: Diagnosis not present

## 2016-04-27 DIAGNOSIS — M9901 Segmental and somatic dysfunction of cervical region: Secondary | ICD-10-CM | POA: Diagnosis not present

## 2016-04-28 NOTE — Progress Notes (Signed)
He presents today for follow-up of plantar fasciitis to his left foot. He states that is doing great and is not very good wearing the night splint.  Objective: Vital signs are stable he is alert and oriented 3. Pulses are palpable. No reproducible pain on palpation to the left heel today.  Assessment: 100% resolution of plantar fasciitis left foot.  Plan: Encouraged him to continue to perform all conservative therapies including anti-inflammatories and a fracture brace and night splint and appropriate shoe gear. I will follow-up with him in 1 month necessary.

## 2016-05-04 DIAGNOSIS — M9901 Segmental and somatic dysfunction of cervical region: Secondary | ICD-10-CM | POA: Diagnosis not present

## 2016-05-04 DIAGNOSIS — M5411 Radiculopathy, occipito-atlanto-axial region: Secondary | ICD-10-CM | POA: Diagnosis not present

## 2016-05-06 DIAGNOSIS — M9901 Segmental and somatic dysfunction of cervical region: Secondary | ICD-10-CM | POA: Diagnosis not present

## 2016-05-06 DIAGNOSIS — M5411 Radiculopathy, occipito-atlanto-axial region: Secondary | ICD-10-CM | POA: Diagnosis not present

## 2016-05-18 ENCOUNTER — Ambulatory Visit (AMBULATORY_SURGERY_CENTER): Payer: Medicare Other | Admitting: *Deleted

## 2016-05-18 VITALS — Ht 69.0 in | Wt 185.0 lb

## 2016-05-18 DIAGNOSIS — Z86010 Personal history of colonic polyps: Secondary | ICD-10-CM

## 2016-05-18 NOTE — Progress Notes (Signed)
No egg or soy allergy  No anesthesia or intubation problems per pt  No diet medications taken  No home oxygen used or hx of sleep apnea 

## 2016-05-19 DIAGNOSIS — M5411 Radiculopathy, occipito-atlanto-axial region: Secondary | ICD-10-CM | POA: Diagnosis not present

## 2016-05-19 DIAGNOSIS — M9901 Segmental and somatic dysfunction of cervical region: Secondary | ICD-10-CM | POA: Diagnosis not present

## 2016-05-19 DIAGNOSIS — M5432 Sciatica, left side: Secondary | ICD-10-CM | POA: Diagnosis not present

## 2016-05-19 DIAGNOSIS — M9903 Segmental and somatic dysfunction of lumbar region: Secondary | ICD-10-CM | POA: Diagnosis not present

## 2016-05-26 DIAGNOSIS — M9901 Segmental and somatic dysfunction of cervical region: Secondary | ICD-10-CM | POA: Diagnosis not present

## 2016-05-26 DIAGNOSIS — M5411 Radiculopathy, occipito-atlanto-axial region: Secondary | ICD-10-CM | POA: Diagnosis not present

## 2016-05-26 DIAGNOSIS — M5432 Sciatica, left side: Secondary | ICD-10-CM | POA: Diagnosis not present

## 2016-05-26 DIAGNOSIS — M9903 Segmental and somatic dysfunction of lumbar region: Secondary | ICD-10-CM | POA: Diagnosis not present

## 2016-05-27 ENCOUNTER — Encounter: Payer: Self-pay | Admitting: Physician Assistant

## 2016-06-01 ENCOUNTER — Encounter: Payer: Self-pay | Admitting: Internal Medicine

## 2016-06-01 ENCOUNTER — Ambulatory Visit (AMBULATORY_SURGERY_CENTER): Payer: Medicare Other | Admitting: Internal Medicine

## 2016-06-01 VITALS — BP 106/51 | HR 47 | Temp 96.8°F | Resp 16 | Ht 69.0 in | Wt 185.0 lb

## 2016-06-01 DIAGNOSIS — Z86010 Personal history of colonic polyps: Secondary | ICD-10-CM

## 2016-06-01 MED ORDER — SODIUM CHLORIDE 0.9 % IV SOLN: 500.0000 mL | INTRAVENOUS | Status: DC

## 2016-06-01 NOTE — Progress Notes (Signed)
To PACU Pt awake and alert. Report to RN 

## 2016-06-01 NOTE — Op Note (Signed)
[facility] Patient Name: Kyle Wilcox Procedure Date: 06/01/2016 9:00 AM MRN: [id number] Endoscopist: Gatha Mayer , MD Age: 66 Referring MD:  Date of Birth: 06-09-1950 Gender: Male Account #: [id number] Procedure:                Colonoscopy Indications:              Surveillance: Personal history of adenomatous                            polyps on last colonoscopy 5 years ago, Last                            colonoscopy: 2012 Medicines:                Propofol per Anesthesia, Monitored Anesthesia Care Procedure:                Pre-Anesthesia Assessment:                           - Prior to the procedure, a History and Physical                            was performed, and patient medications and                            allergies were reviewed. The patient's tolerance of                            previous anesthesia was also reviewed. The risks                            and benefits of the procedure and the sedation                            options and risks were discussed with the patient.                            All questions were answered, and informed consent                            was obtained. Prior Anticoagulants: The patient                            last took aspirin 1 day prior to the procedure. ASA                            Grade Assessment: II - A patient with mild systemic                            disease. After reviewing the risks and benefits,                            the patient was deemed in satisfactory condition to  undergo the procedure.                           After obtaining informed consent, the colonoscope                            was passed under direct vision. Throughout the                            procedure, the patient's blood pressure, pulse, and                            oxygen saturations were monitored continuously. The                            Colonoscope was introduced through the  anus and                            advanced to the the cecum, identified by                            appendiceal orifice and ileocecal valve. The                            colonoscopy was performed without difficulty. The                            patient tolerated the procedure well. The quality                            of the bowel preparation was excellent. The bowel                            preparation used was Miralax. The ileocecal valve,                            appendiceal orifice, and rectum were photographed. Scope In: 9:08:20 AM Scope Out: 9:22:38 AM Scope Withdrawal Time: 0 hours 10 minutes 52 seconds  Total Procedure Duration: 0 hours 14 minutes 18 seconds  Findings:                 The perianal examination was normal.                           The digital rectal exam findings include surgically                            absent prostate.                           The entire examined colon appeared normal on direct                            and retroflexion views. Complications:            No immediate complications. Estimated Blood  Loss:     Estimated blood loss: none. Impression:               - A surgically absent prostate found on digital                            rectal exam.                           - The entire examined colon is normal on direct and                            retroflexion views.                           - No specimens collected.                           - Personal history of colonic polyps. diminutive                            adenoma 2012 Recommendation:           - Patient has a contact number available for                            emergencies. The signs and symptoms of potential                            delayed complications were discussed with the                            patient. Return to normal activities tomorrow.                            Written discharge instructions were provided to the                             patient.                           - Resume previous diet.                           - Continue present medications.                           - Repeat colonoscopy in 10 years. Gatha Mayer, MD 06/01/2016 9:38:58 AM This report has been signed electronically.

## 2016-06-01 NOTE — Patient Instructions (Addendum)
   No polyps today  Next routine colonoscopy in 10 years - 2028  I appreciate the opportunity to care for you. Gatha Mayer, MD, FACG     YOU HAD AN ENDOSCOPIC PROCEDURE TODAY AT [facility]:   Refer to the procedure report that was given to you for any specific questions about what was found during the examination.  If the procedure report does not answer your questions, please call your gastroenterologist to clarify.  If you requested that your care partner not be given the details of your procedure findings, then the procedure report has been included in a sealed envelope for you to review at your convenience later.  YOU SHOULD EXPECT: Some feelings of bloating in the abdomen. Passage of more gas than usual.  Walking can help get rid of the air that was put into your GI tract during the procedure and reduce the bloating. If you had a lower endoscopy (such as a colonoscopy or flexible sigmoidoscopy) you may notice spotting of blood in your stool or on the toilet paper. If you underwent a bowel prep for your procedure, you may not have a normal bowel movement for a few days.  Please Note:  You might notice some irritation and congestion in your nose or some drainage.  This is from the oxygen used during your procedure.  There is no need for concern and it should clear up in a day or so.  SYMPTOMS TO REPORT IMMEDIATELY:   Following lower endoscopy (colonoscopy or flexible sigmoidoscopy):  Excessive amounts of blood in the stool  Significant tenderness or worsening of abdominal pains  Swelling of the abdomen that is new, acute  Fever of 100F or higher    For urgent or emergent issues, a gastroenterologist can be reached at any hour by calling [phone].   DIET:  We do recommend a small meal at first, but then you may proceed to your regular diet.  Drink plenty of fluids but you should avoid alcoholic beverages for 24 hours.  ACTIVITY:  You should plan to  take it easy for the rest of today and you should NOT DRIVE or use heavy machinery until tomorrow (because of the sedation medicines used during the test).    FOLLOW UP: Our staff will call the number listed on your records the next business day following your procedure to check on you and address any questions or concerns that you may have regarding the information given to you following your procedure. If we do not reach you, we will leave a message.  However, if you are feeling well and you are not experiencing any problems, there is no need to return our call.  We will assume that you have returned to your regular daily activities without incident.  If any biopsies were taken you will be contacted by phone or by letter within the next 1-3 weeks.  Please call us at [phone] if you have not heard about the biopsies in 3 weeks.    SIGNATURES/CONFIDENTIALITY: You and/or your care partner have signed paperwork which will be entered into your electronic medical record.  These signatures attest to the fact that that the information above on your After Visit Summary has been reviewed and is understood.  Full responsibility of the confidentiality of this discharge information lies with you and/or your care-partner.   Resume medications.

## 2016-06-02 ENCOUNTER — Telehealth: Payer: Self-pay | Admitting: *Deleted

## 2016-06-02 NOTE — Telephone Encounter (Signed)
Message left

## 2016-06-17 DIAGNOSIS — M9901 Segmental and somatic dysfunction of cervical region: Secondary | ICD-10-CM | POA: Diagnosis not present

## 2016-06-17 DIAGNOSIS — M9903 Segmental and somatic dysfunction of lumbar region: Secondary | ICD-10-CM | POA: Diagnosis not present

## 2016-06-17 DIAGNOSIS — M5411 Radiculopathy, occipito-atlanto-axial region: Secondary | ICD-10-CM | POA: Diagnosis not present

## 2016-06-17 DIAGNOSIS — M5432 Sciatica, left side: Secondary | ICD-10-CM | POA: Diagnosis not present

## 2016-07-08 DIAGNOSIS — M5411 Radiculopathy, occipito-atlanto-axial region: Secondary | ICD-10-CM | POA: Diagnosis not present

## 2016-07-08 DIAGNOSIS — M5432 Sciatica, left side: Secondary | ICD-10-CM | POA: Diagnosis not present

## 2016-07-08 DIAGNOSIS — M9901 Segmental and somatic dysfunction of cervical region: Secondary | ICD-10-CM | POA: Diagnosis not present

## 2016-07-08 DIAGNOSIS — M9903 Segmental and somatic dysfunction of lumbar region: Secondary | ICD-10-CM | POA: Diagnosis not present

## 2016-07-27 DIAGNOSIS — M9901 Segmental and somatic dysfunction of cervical region: Secondary | ICD-10-CM | POA: Diagnosis not present

## 2016-07-27 DIAGNOSIS — M9903 Segmental and somatic dysfunction of lumbar region: Secondary | ICD-10-CM | POA: Diagnosis not present

## 2016-07-27 DIAGNOSIS — M5432 Sciatica, left side: Secondary | ICD-10-CM | POA: Diagnosis not present

## 2016-07-27 DIAGNOSIS — M5411 Radiculopathy, occipito-atlanto-axial region: Secondary | ICD-10-CM | POA: Diagnosis not present

## 2016-08-03 ENCOUNTER — Other Ambulatory Visit: Payer: Self-pay | Admitting: Physician Assistant

## 2016-08-04 NOTE — Telephone Encounter (Signed)
Refill appropriate 

## 2016-08-24 DIAGNOSIS — M9901 Segmental and somatic dysfunction of cervical region: Secondary | ICD-10-CM | POA: Diagnosis not present

## 2016-08-24 DIAGNOSIS — M5411 Radiculopathy, occipito-atlanto-axial region: Secondary | ICD-10-CM | POA: Diagnosis not present

## 2016-08-24 DIAGNOSIS — M9903 Segmental and somatic dysfunction of lumbar region: Secondary | ICD-10-CM | POA: Diagnosis not present

## 2016-08-24 DIAGNOSIS — M5432 Sciatica, left side: Secondary | ICD-10-CM | POA: Diagnosis not present

## 2016-08-31 ENCOUNTER — Encounter: Payer: Self-pay | Admitting: Family Medicine

## 2016-08-31 ENCOUNTER — Ambulatory Visit (INDEPENDENT_AMBULATORY_CARE_PROVIDER_SITE_OTHER): Payer: Medicare Other | Admitting: Family Medicine

## 2016-08-31 VITALS — BP 136/80 | HR 60 | Temp 97.8°F | Resp 16 | Ht 69.0 in | Wt 187.0 lb

## 2016-08-31 DIAGNOSIS — M25511 Pain in right shoulder: Secondary | ICD-10-CM | POA: Diagnosis not present

## 2016-08-31 MED ORDER — PREDNISONE 20 MG PO TABS: ORAL_TABLET | ORAL | 0 refills | Status: DC

## 2016-08-31 NOTE — Progress Notes (Signed)
Subjective:    Patient ID: Kyle Wilcox, male    DOB: [birth year], 66 y.o.   MRN: [id number]  HPI  Patient has a history of a total right shoulder replacement in 2011. Last week, the patient was playing golf and developed pain in his right shoulder. Pain is worse with abduction greater than 90. Radiates from the right side of his neck into the posterior right shoulder and down into the deltoid and lateral tricep. He has mild pain with empty can sign. He has a negative Hawkins maneuver. He has no pain with internal or external rotation although he has significant weakness with internal and external rotation he states is chronic ever since his surgery. There is no crepitus or palpable for me in the shoulder. He has negative Spurling sign. He has normal reflexes in the right upper extremity Past Medical History:  Diagnosis Date  . Allergy 2014  . Cancer [facility]) 2015   prostate cancer  . Cataract 2016  . DDD (degenerative disc disease), lumbar 12/24/2014  . Glaucoma   . Hx of colonic polyps 02/19/2011  . Hyperlipidemia 11/07/2014  . Peripheral neuropathy 12/24/2014   Past Surgical History:  Procedure Laterality Date  . COLONOSCOPY    . EYE SURGERY     laser surgery to relieve pressure on eyes- both eyes  . JOINT REPLACEMENT  2011   total shoulder replacement (Right)  . PROSTATE SURGERY     2015  . VASECTOMY  1982   Current Outpatient Prescriptions on File Prior to Visit  Medication Sig Dispense Refill  . aspirin (ASPIR-81) 81 MG EC tablet Take 81 mg by mouth daily. Swallow whole.    . Bimatoprost (LUMIGAN OP) Apply 5 mLs to eye 2 (two) times daily.    . brimonidine (ALPHAGAN) 0.2 % ophthalmic solution     . brinzolamide (AZOPT) 1 % ophthalmic suspension Place 1 drop into both eyes 2 (two) times daily.    Marland Kitchen gabapentin (NEURONTIN) 600 MG tablet TAKE 1 TABLET TWICE A DAY 180 tablet 3  . Methylcellulose, Laxative, (FIBER THERAPY PO) Take 0.52 mg by mouth 2 (two) times daily.    . ONE DAILY  MULTIPLE VITAMIN PO Take by mouth.    . simvastatin (ZOCOR) 20 MG tablet TAKE 1 TABLET DAILY 90 tablet 1   No current facility-administered medications on file prior to visit.    No Known Allergies Social History   Social History  . Marital status: Married    Spouse name: N/A  . Number of children: N/A  . Years of education: N/A   Occupational History  . Not on file.   Social History Main Topics  . Smoking status: Former Smoker    Quit date: 03/09/1981  . Smokeless tobacco: Never Used  . Alcohol use 1.2 oz/week    2 Cans of beer per week     Comment: occasionally  . Drug use: No  . Sexual activity: Not Currently   Other Topics Concern  . Not on file   Social History Narrative  . No narrative on file     Review of Systems  All other systems reviewed and are negative.      Objective:   Physical Exam  Cardiovascular: Normal rate, regular rhythm and normal heart sounds.   Pulmonary/Chest: Effort normal and breath sounds normal.  Musculoskeletal:       Right shoulder: He exhibits decreased range of motion, pain and decreased strength. He exhibits no tenderness, no bony tenderness, no swelling, no  effusion, no crepitus, no deformity and no spasm.  Vitals reviewed.         Assessment & Plan:  Acute pain of right shoulder - Plan: predniSONE (DELTASONE) 20 MG tablet, DG Shoulder Right  I suspect supraspinatus tendinitis versus bursitis triggered by his recent golf. Given his history of an shoulder replacement, I'm hesitant to perform an intra-articular cortisone injection to the risk of infection. Therefore I'll treat the patient with a prednisone taper pack and I will have the patient get an x-ray of his right shoulder to evaluate further

## 2016-09-01 ENCOUNTER — Ambulatory Visit
Admission: RE | Admit: 2016-09-01 | Discharge: 2016-09-01 | Disposition: A | Discharge status: Home / Self Care | Payer: Medicare Other | Source: Ambulatory Visit | Attending: Family Medicine | Admitting: Family Medicine

## 2016-09-01 DIAGNOSIS — M25511 Pain in right shoulder: Secondary | ICD-10-CM

## 2016-09-01 DIAGNOSIS — M5432 Sciatica, left side: Secondary | ICD-10-CM | POA: Diagnosis not present

## 2016-09-01 DIAGNOSIS — M9903 Segmental and somatic dysfunction of lumbar region: Secondary | ICD-10-CM | POA: Diagnosis not present

## 2016-09-02 ENCOUNTER — Encounter: Payer: Self-pay | Admitting: Family Medicine

## 2016-09-19 ENCOUNTER — Other Ambulatory Visit: Payer: Self-pay | Admitting: Physician Assistant

## 2016-09-20 NOTE — Telephone Encounter (Signed)
Refill appropriate 

## 2016-10-15 ENCOUNTER — Telehealth: Payer: Self-pay | Admitting: Family Medicine

## 2016-10-15 NOTE — Telephone Encounter (Signed)
Repeat oral prednisone in the face of glaucoma is dangerous.  Would not repeat.

## 2016-10-15 NOTE — Telephone Encounter (Signed)
Patient aware of providers recommendations and will keep appt for monday

## 2016-10-15 NOTE — Telephone Encounter (Signed)
Pt wants to know if we can call in prednisone to [facility] [address] for his shoulder pain he has an appointment with [facility] on Monday.

## 2016-10-18 ENCOUNTER — Encounter: Payer: Self-pay | Admitting: Family Medicine

## 2016-10-18 ENCOUNTER — Ambulatory Visit (INDEPENDENT_AMBULATORY_CARE_PROVIDER_SITE_OTHER): Payer: Medicare Other | Admitting: Family Medicine

## 2016-10-18 VITALS — BP 120/60 | HR 62 | Temp 97.8°F | Resp 16 | Ht 69.0 in | Wt 187.0 lb

## 2016-10-18 DIAGNOSIS — F5104 Psychophysiologic insomnia: Secondary | ICD-10-CM

## 2016-10-18 DIAGNOSIS — Z96611 Presence of right artificial shoulder joint: Secondary | ICD-10-CM | POA: Diagnosis not present

## 2016-10-18 DIAGNOSIS — M67911 Unspecified disorder of synovium and tendon, right shoulder: Secondary | ICD-10-CM

## 2016-10-18 MED ORDER — TRAZODONE HCL 50 MG PO TABS: 25.0000 mg | ORAL_TABLET | Freq: Every evening | ORAL | 3 refills | Status: DC | PRN

## 2016-10-18 MED ORDER — MELOXICAM 7.5 MG PO TABS: 7.5000 mg | ORAL_TABLET | Freq: Every day | ORAL | 1 refills | Status: DC

## 2016-10-18 NOTE — Patient Instructions (Signed)
Release of records- [facility] - Need Operative notes/MRI/Office visits notes ASAP for Shoulder  Referral to [facility] Dr. Thea Alken  Take the meloxicam Trazodone for sleep- start with 1/2 tablet  F/U as needed

## 2016-10-18 NOTE — Progress Notes (Signed)
   Subjective:    Patient ID: Kyle Wilcox, male    DOB: 08/07/1950, 66 y.o.   MRN: [id number]  Patient presents for Right shoulder pain Is here with worsening right shoulder pain. He had overuse injury in the past which resulted in arthroscopic replacement of his right shoulder. This was performed by Dr. Higinio Plan in [facility]. Since then he's had intermittent episodes of severe pain he cannot raise the arm above shoulder level seems to be acting up more and more. He was treated with Medrol Dosepak back in April. I will give to use plain golf and since then he has had increased pain in his arm. He was on meloxicam in the past and that did help with some of the pain. He would like to establish with a specialist here. Note he did have x-ray in April which showed that his hardware was intact.  Physical he also states he's been having difficulty sleeping although this has been going on for years. They've tried melatonin and other over-the-counter sleep aids no improvement. He finds himself waking up multiple times at night and that he has some fatigue during the day. His wife states he will fall sleep. She is sitting there a few moments during the daytime. He would like to try something prescription strength to help with his sleep. No apnea episodes witnessed  Review Of Systems:  GEN- denies fatigue, fever, weight loss,weakness, recent illness HEENT- denies eye drainage, change in vision, nasal discharge, CVS- denies chest pain, palpitations RESP- denies SOB, cough, wheeze ABD- denies N/V, change in stools, abd pain GU- denies dysuria, hematuria, dribbling, incontinence MSK- + joint pain, muscle aches, injury Neuro- denies headache, dizziness, syncope, seizure activity       Objective:    BP 120/60   Pulse 62   Temp 97.8 F (36.6 C) (Oral)   Resp 16   Ht 5\' 9"  (1.753 m)   Wt 187 lb (84.8 kg)   BMI 27.62 kg/m  GEN- NAD, alert and oriented x3 HEENT- PERRL, EOMI, non  injected sclera, pink conjunctiva, MMM, oropharynx clear Neck- Supple,  Fair ROM MSK- normal apperance bilat UE, decraesed ROM right UE/TTP post shoulder and near [facility], biceps in tact,unable to obtain neers due to ROM right side  No edema Pulse- radial 2+  Psych- normal affect and mood        Assessment & Plan:      Problem List Items Addressed This Visit    Disorder of rotator cuff - Primary    Previous rotator cuff injury, history of surgical replacement, XRay from April shows everything in tact But persistant pain, and decreased ROM Send to orthopedics, restarted on mobic as this works best  [employer] records from previous Psychologist, sport and exercise      Relevant Orders   Ambulatory referral to Orthopedic Surgery   Chronic insomnia    Trial of trazodone, avoid daytime naps       Other Visit Diagnoses    History of right shoulder replacement       Relevant Orders   Ambulatory referral to Orthopedic Surgery      Note: This dictation was prepared with Dragon dictation along with smaller phrase technology. Any transcriptional errors that result from this process are unintentional.

## 2016-10-18 NOTE — Assessment & Plan Note (Signed)
Trial of trazodone, avoid daytime naps

## 2016-10-18 NOTE — Assessment & Plan Note (Addendum)
Previous rotator cuff injury, history of surgical replacement, XRay from April shows everything in tact But persistant pain, and decreased ROM Send to orthopedics, restarted on mobic as this works best  [employer] records from previous Psychologist, sport and exercise

## 2016-10-19 ENCOUNTER — Encounter: Payer: Self-pay | Admitting: Family Medicine

## 2016-10-20 DIAGNOSIS — M25511 Pain in right shoulder: Secondary | ICD-10-CM | POA: Diagnosis not present

## 2016-10-20 DIAGNOSIS — M542 Cervicalgia: Secondary | ICD-10-CM | POA: Diagnosis not present

## 2016-11-17 DIAGNOSIS — M722 Plantar fascial fibromatosis: Secondary | ICD-10-CM | POA: Insufficient documentation

## 2016-11-25 DIAGNOSIS — H401213 Low-tension glaucoma, right eye, severe stage: Secondary | ICD-10-CM | POA: Diagnosis not present

## 2016-11-25 DIAGNOSIS — H25813 Combined forms of age-related cataract, bilateral: Secondary | ICD-10-CM | POA: Diagnosis not present

## 2016-11-25 DIAGNOSIS — H401222 Low-tension glaucoma, left eye, moderate stage: Secondary | ICD-10-CM | POA: Diagnosis not present

## 2016-12-22 ENCOUNTER — Ambulatory Visit (INDEPENDENT_AMBULATORY_CARE_PROVIDER_SITE_OTHER): Payer: Medicare Other | Admitting: Physician Assistant

## 2016-12-22 ENCOUNTER — Encounter: Payer: Self-pay | Admitting: Physician Assistant

## 2016-12-22 VITALS — BP 132/80 | HR 58 | Temp 97.5°F | Resp 15 | Ht 69.0 in | Wt 184.0 lb

## 2016-12-22 DIAGNOSIS — R1031 Right lower quadrant pain: Secondary | ICD-10-CM | POA: Diagnosis not present

## 2016-12-22 DIAGNOSIS — L918 Other hypertrophic disorders of the skin: Secondary | ICD-10-CM | POA: Diagnosis not present

## 2016-12-22 NOTE — Progress Notes (Signed)
Patient ID: Kyle Wilcox MRN: [id number], DOB: 10/30/1950, 66 y.o. Date of Encounter: 12/22/2016, 1:02 PM    Chief Complaint:  Chief Complaint  Patient presents with  . Abdominal Pain    lower area x2weeks , hurts when coughing and leaning forward  . skin tag in groin area     HPI: 66 y.o. year old male presents with above.   He reports that recently--- just over the last 2 weeks--- he has noticed some mild discomfort in his right low abdomen at times --- only feels this if he coughs or sneezes. Also sometimes when he leans forward/bends forward. Has felt no discomfort down into the right scrotum. He goes to the gym and rides a bicycle there but does no weights at all. Feels no discomfort while riding the bicycle. He feels no discomfort in his abdomen at other times --- only fills any discomfort when cough sneeze or bend forward. "Wasn't sure if something was irritated or pulled there" He has had no change in bowel habits. No nausea no vomiting no diarrhea no constipation.  Also has skin tag in the groin area that wants removed.  No other concerns to address.     Home Meds:   Outpatient Medications Prior to Visit  Medication Sig Dispense Refill  . aspirin (ASPIR-81) 81 MG EC tablet Take 81 mg by mouth daily. Swallow whole.    . Bimatoprost (LUMIGAN OP) Apply 5 mLs to eye 2 (two) times daily.    . brimonidine (ALPHAGAN) 0.2 % ophthalmic solution     . brinzolamide (AZOPT) 1 % ophthalmic suspension Place 1 drop into both eyes 2 (two) times daily.    Marland Kitchen gabapentin (NEURONTIN) 600 MG tablet TAKE 1 TABLET TWICE A DAY 180 tablet 3  . meloxicam (MOBIC) 7.5 MG tablet Take 1 tablet (7.5 mg total) by mouth daily. 30 tablet 1  . Methylcellulose, Laxative, (FIBER THERAPY PO) Take 0.52 mg by mouth 2 (two) times daily.    . ONE DAILY MULTIPLE VITAMIN PO Take by mouth.    . simvastatin (ZOCOR) 20 MG tablet TAKE 1 TABLET DAILY 90 tablet 1  . traZODone (DESYREL) 50 MG tablet Take 0.5-1 tablets  (25-50 mg total) by mouth at bedtime as needed for sleep. 30 tablet 3   No facility-administered medications prior to visit.     Allergies: No Known Allergies    Review of Systems: See HPI for pertinent ROS. All other ROS negative.    Physical Exam: Blood pressure 132/80, pulse (!) 58, temperature (!) 97.5 F (36.4 C), temperature source Oral, resp. rate 15, height 5\' 9"  (1.753 m), weight 184 lb (83.5 kg), SpO2 98 %., Body mass index is 27.17 kg/m. General:  WNWD WM. Appears in no acute distress. Neck: Supple. No thyromegaly. No lymphadenopathy. Lungs: Clear bilaterally to auscultation without wheezes, rales, or rhonchi. Breathing is unlabored. Heart: Regular rhythm. No murmurs, rubs, or gallops. Abdomen: Soft, non-tender, non-distended with normoactive bowel sounds. No hepatomegaly. No rebound/guarding. No obvious abdominal masses. With palpation of the abdomen--- even in the right lower quadrant -- there is no tenderness reproduced with palpation. Also with palpation, I am not feeling any mass or hernia. Vonna Kotyk in room as chaperone during the following part of exam: Checked right scrotum with patient coughing--I feel no hernia and he reports that this is not reproducing any symptoms. Msk:  Strength and tone normal for age. Extremities/Skin: Right upper medial thigh: 3 small skin tags Neuro: Alert and oriented X 3.  Moves all extremities spontaneously. Gait is normal. CNII-XII grossly in tact. Psych:  Responds to questions appropriately with a normal affect.     ASSESSMENT AND PLAN:  66 y.o. year old male with   1. Right lower quadrant abdominal pain Discussed hernia with patient. Discussed that his symptoms sound consistent with possible small hernia. Other possible differential includes muscle strain. He is to monitor his symptoms over the next couple of weeks. If symptoms worsen then follow-up immediately. If symptoms resolve over the next couple weeks then no further  treatment needed. If symptoms persist after 2 more weeks then he is to call me and I will arrange for further evaluation.  2. Cutaneous skin tags Skin tags excised using scissors. All bleeding had ceased at time that he left office.     Signed, [address], [place], [facility] 12/22/2016 1:02 PM

## 2017-01-03 DIAGNOSIS — H401222 Low-tension glaucoma, left eye, moderate stage: Secondary | ICD-10-CM | POA: Diagnosis not present

## 2017-01-03 DIAGNOSIS — H401213 Low-tension glaucoma, right eye, severe stage: Secondary | ICD-10-CM | POA: Diagnosis not present

## 2017-01-24 DIAGNOSIS — M5432 Sciatica, left side: Secondary | ICD-10-CM | POA: Diagnosis not present

## 2017-01-24 DIAGNOSIS — M9903 Segmental and somatic dysfunction of lumbar region: Secondary | ICD-10-CM | POA: Diagnosis not present

## 2017-03-18 ENCOUNTER — Other Ambulatory Visit: Payer: Self-pay | Admitting: Family Medicine

## 2017-03-18 DIAGNOSIS — E785 Hyperlipidemia, unspecified: Secondary | ICD-10-CM

## 2017-03-18 DIAGNOSIS — G609 Hereditary and idiopathic neuropathy, unspecified: Secondary | ICD-10-CM

## 2017-03-18 DIAGNOSIS — Z79899 Other long term (current) drug therapy: Secondary | ICD-10-CM

## 2017-03-18 DIAGNOSIS — Z0000 Encounter for general adult medical examination without abnormal findings: Secondary | ICD-10-CM

## 2017-03-21 ENCOUNTER — Other Ambulatory Visit: Payer: Medicare Other

## 2017-03-21 DIAGNOSIS — G609 Hereditary and idiopathic neuropathy, unspecified: Secondary | ICD-10-CM | POA: Diagnosis not present

## 2017-03-21 DIAGNOSIS — E785 Hyperlipidemia, unspecified: Secondary | ICD-10-CM

## 2017-03-21 DIAGNOSIS — Z79899 Other long term (current) drug therapy: Secondary | ICD-10-CM

## 2017-03-21 DIAGNOSIS — Z0000 Encounter for general adult medical examination without abnormal findings: Secondary | ICD-10-CM

## 2017-03-21 LAB — CBC WITH DIFFERENTIAL/PLATELET
Basophils Absolute: 39 cells/uL (ref 0–200)
Basophils Relative: 0.7 %
Eosinophils Absolute: 132 cells/uL (ref 15–500)
Eosinophils Relative: 2.4 %
HCT: 46.4 % (ref 38.5–50.0)
HEMOGLOBIN: 15.9 g/dL (ref 13.2–17.1)
LYMPHS ABS: 1821 {cells}/uL (ref 850–3900)
MCH: 30.1 pg (ref 27.0–33.0)
MCHC: 34.3 g/dL (ref 32.0–36.0)
MCV: 87.7 fL (ref 80.0–100.0)
MPV: 10.8 fL (ref 7.5–12.5)
Monocytes Relative: 13 %
NEUTROS ABS: 2794 {cells}/uL (ref 1500–7800)
Neutrophils Relative %: 50.8 %
Platelets: 240 10*3/uL (ref 140–400)
RBC: 5.29 10*6/uL (ref 4.20–5.80)
RDW: 13 % (ref 11.0–15.0)
Total Lymphocyte: 33.1 %
WBC mixed population: 715 cells/uL (ref 200–950)
WBC: 5.5 10*3/uL (ref 3.8–10.8)

## 2017-03-21 LAB — COMPLETE METABOLIC PANEL WITH GFR
AG RATIO: 1.6 (calc) (ref 1.0–2.5)
ALBUMIN MSPROF: 4.2 g/dL (ref 3.6–5.1)
ALT: 21 U/L (ref 9–46)
AST: 18 U/L (ref 10–35)
Alkaline phosphatase (APISO): 83 U/L (ref 40–115)
BUN: 19 mg/dL (ref 7–25)
CO2: 32 mmol/L (ref 20–32)
CREATININE: 0.94 mg/dL (ref 0.70–1.25)
Calcium: 9.8 mg/dL (ref 8.6–10.3)
Chloride: 102 mmol/L (ref 98–110)
GFR, Est African American: 97 mL/min/{1.73_m2} (ref >=60)
GFR, Est Non African American: 84 mL/min/{1.73_m2} (ref >=60)
GLOBULIN: 2.6 g/dL (ref 1.9–3.7)
Glucose, Bld: 84 mg/dL (ref 65–99)
POTASSIUM: 4.6 mmol/L (ref 3.5–5.3)
SODIUM: 140 mmol/L (ref 135–146)
Total Bilirubin: 0.4 mg/dL (ref 0.2–1.2)
Total Protein: 6.8 g/dL (ref 6.1–8.1)

## 2017-03-21 LAB — LIPID PANEL
Cholesterol: 186 mg/dL (ref <200)
HDL: 36 mg/dL — AB (ref >40)
LDL Cholesterol (Calc): 121 mg/dL (calc) — ABNORMAL HIGH
Non-HDL Cholesterol (Calc): 150 mg/dL (calc) — ABNORMAL HIGH (ref <130)
Total CHOL/HDL Ratio: 5.2 (calc) — ABNORMAL HIGH (ref <5.0)
Triglycerides: 175 mg/dL — ABNORMAL HIGH (ref <150)

## 2017-03-23 ENCOUNTER — Ambulatory Visit (INDEPENDENT_AMBULATORY_CARE_PROVIDER_SITE_OTHER): Payer: Medicare Other | Admitting: Physician Assistant

## 2017-03-23 ENCOUNTER — Encounter: Payer: Self-pay | Admitting: Physician Assistant

## 2017-03-23 VITALS — BP 128/82 | HR 61 | Temp 97.4°F | Resp 14 | Ht 65.5 in | Wt 184.0 lb

## 2017-03-23 DIAGNOSIS — Z8546 Personal history of malignant neoplasm of prostate: Secondary | ICD-10-CM

## 2017-03-23 DIAGNOSIS — M5136 Other intervertebral disc degeneration, lumbar region: Secondary | ICD-10-CM | POA: Diagnosis not present

## 2017-03-23 DIAGNOSIS — E785 Hyperlipidemia, unspecified: Secondary | ICD-10-CM | POA: Diagnosis not present

## 2017-03-23 DIAGNOSIS — F5104 Psychophysiologic insomnia: Secondary | ICD-10-CM

## 2017-03-23 DIAGNOSIS — Z86010 Personal history of colonic polyps: Secondary | ICD-10-CM

## 2017-03-23 DIAGNOSIS — Z0000 Encounter for general adult medical examination without abnormal findings: Secondary | ICD-10-CM | POA: Diagnosis not present

## 2017-03-23 MED ORDER — GABAPENTIN 600 MG PO TABS: 600.0000 mg | ORAL_TABLET | Freq: Two times a day (BID) | ORAL | 3 refills | Status: DC

## 2017-03-23 MED ORDER — TRAZODONE HCL 50 MG PO TABS: 25.0000 mg | ORAL_TABLET | Freq: Every evening | ORAL | 1 refills | Status: DC | PRN

## 2017-03-23 MED ORDER — SIMVASTATIN 20 MG PO TABS: 20.0000 mg | ORAL_TABLET | Freq: Every day | ORAL | 1 refills | Status: DC

## 2017-03-23 MED ORDER — MELOXICAM 7.5 MG PO TABS: 7.5000 mg | ORAL_TABLET | Freq: Every day | ORAL | 3 refills | Status: DC

## 2017-03-23 NOTE — Progress Notes (Signed)
Patient ID: Kyle Wilcox MRN: [id number], DOB: [birth year] 66 y.o. Date of Encounter: 03/23/2017, 8:46 AM    Chief Complaint: Physical (CPE)  HPI: 66 y.o. y/o  white male here for CPE.    THE FOLLOWING IS COPIED FROM HIS INITIAL OV NOTE WITH ME, WHICH WAS 11/07/2014: HPI: 66 y.o. year old white male presents with his wife office visit today. They are both here to establish as new patients.  THIS IS ADDED 12/05/2014: I RECEIVED RECORDS FROM [facility]. REVIEWED ALL OF THEM. GAVE TO KIM TO MAKE SURE ALL DIAGNOSIS ARE ABSTRACTED TO PROBLEM LIST AND THAT ALL MEDICATIONS ARE ON MEDICATION LIST.  OTHERWISE, I SAW NO NOTABLE INFORMATION IN THE RECORDS.  RECEIVED LAST LABS DONE 10/03/2013----CMET--NORMAL, FLP--LDL 121, THESE ARE THE ONLY LABS FROM THAT DATE ONLY OTHER LABS WE RECEIVED ARE FROM 09/26/2012---CMET--NORMAL, FLP--LDL 100, PSA. NO OTHER LABS.   He just recently moved here from [place] in April 2016.  He was going to [facility]. Says he went there every 3 or 4 months for labs and office visit to monitor his simvastatin. He also had CPE there once a year --- Last CPE was September 2015.  He also has a history of prostate cancer and underwent a complete prostatectomy 08/27/2013. Says that he had no chemotherapy or radiation but just the surgical resection. He has continued follow-up with urology every 6 months. Says that he just saw them last week for his second 6 month follow-up visit. They had told him to follow-up 6 months. Today I discussed referring him to a Urologist to establish care here locally and he is agreeable with this.  He also has glaucoma and history of cataracts. He has already established with an eye doctor here. His prior eye doctor actually has an office here so he has been able to follow-up with that same group.  He has no complaints or concerns. Is just here to get established.    03/10/2015: He states that he has gotten him  a part-time job at [employer]. Says-- that way-- he can play golf for free and  also it "gives him some time away from his wife's honeydo list"  Also says that his son went to college at [employer] and then stayed in the [place] area after college. This is why patient and his wife moved here. His son is now married with a 67-year-old child.    03/17/2016: He states that he has quit working part-time at [employer] course. Says it was too much bending and too much cold weather. Says he has been having problems with plantar fasciitis of his left foot. Says that in the past I have given him a handout with some stretches to do and had also discussed using shoes with proper arch support and heel cups etc. Says that he has been doing all of this but is still having problems with the left plantar fasciitis. He is still seeing Urology for routine follow-up. Also notes that his father had AAA aneurysm and he would like to get screening test for this. No other specific complaints or concerns today. He is still taking his simvastatin as directed. No myalgias or other adverse effects.   03/23/2017: Today he reports that recently he has been noticing some achy discomfort in his left shoulder.  Says that it is his right shoulder that he has had surgery on in the past past, but now it is the left shoulder that is causing  him some discomfort. Also reports that he sometimes gets a cramp in his right calf at night.  Says that he rides the bicycle at the gym and walks, hoping this will help to "straighten it out " Asked how much he is playing golf these days.  Says that he tries to play 2 times per week but sometimes it is less than that if it is bad weather does not play. Asked if he has any problems with low back pain.  He responds by saying "I do have some problems with sciatica "--- ask what the symptoms involve and he points to right buttock is area where he usually gets these symptoms when this occurs  and says that it occasionally also goes down the lateral aspect of right thigh but this occurs rarely/minimally. Also adds that he has noticed that if he does more than a week between playing golf that then when he does play all of that twisting and movement does seem to get things really sore. He is taking his Zocor as directed.  This is causing no myalgias or other adverse effects. Does continue to see the urologist routinely and has an appointment at the end of November with them. He has no other specific concerns to address today.  Otherwise has been feeling well.   Review of Systems: Consitutional: No fever, chills, fatigue, night sweats, lymphadenopathy, or weight changes. Eyes: No visual changes, eye redness, or discharge. ENT/Mouth: Ears: No otalgia, tinnitus, hearing loss, discharge. Nose: No congestion, rhinorrhea, sinus pain, or epistaxis. Throat: No sore throat, post nasal drip, or teeth pain. Cardiovascular: No CP, palpitations, diaphoresis, DOE, edema, orthopnea, PND. Respiratory: No cough, hemoptysis, SOB, or wheezing. Gastrointestinal: No anorexia, dysphagia, reflux, pain, nausea, vomiting, hematemesis, diarrhea, constipation, BRBPR, or melena. Genitourinary: ---------------Per Urology----------------------- Musculoskeletal: ----------------See HPI------------------------- Skin: No rash, erythema, lesion changes, pain, warmth, jaundice, or pruritis. Neurological: No headache, dizziness, syncope, seizures, tremors, memory loss, coordination problems, or paresthesias. Psychological: No anxiety, depression, hallucinations, SI/HI. Endocrine: No fatigue, polydipsia, polyphagia, polyuria, or known diabetes. All other systems were reviewed and are otherwise negative.  Past Medical History:  Diagnosis Date  . Allergy 2014  . Cancer [facility]) 2015   prostate cancer  . Cataract 2016  . DDD (degenerative disc disease), lumbar 12/24/2014  . Glaucoma   . Hx of colonic polyps 02/19/2011  .  Hyperlipidemia 11/07/2014  . Peripheral neuropathy 12/24/2014     Past Surgical History:  Procedure Laterality Date  . COLONOSCOPY    . EYE SURGERY     laser surgery to relieve pressure on eyes- both eyes  . JOINT REPLACEMENT  2011   total shoulder replacement (Right)  . PROSTATE SURGERY     2015  . VASECTOMY  1982    Home Meds:  Outpatient Medications Prior to Visit  Medication Sig Dispense Refill  . aspirin (ASPIR-81) 81 MG EC tablet Take 81 mg by mouth daily. Swallow whole.    . Bimatoprost (LUMIGAN OP) Apply 5 mLs to eye 2 (two) times daily.    . brimonidine (ALPHAGAN) 0.2 % ophthalmic solution     . brinzolamide (AZOPT) 1 % ophthalmic suspension Place 1 drop into both eyes 2 (two) times daily.    Marland Kitchen gabapentin (NEURONTIN) 600 MG tablet TAKE 1 TABLET TWICE A DAY 180 tablet 3  . meloxicam (MOBIC) 7.5 MG tablet Take 1 tablet (7.5 mg total) by mouth daily. 30 tablet 1  . Methylcellulose, Laxative, (FIBER THERAPY PO) Take 0.52 mg by mouth 2 (two) times  daily.    . ONE DAILY MULTIPLE VITAMIN PO Take by mouth.    . simvastatin (ZOCOR) 20 MG tablet TAKE 1 TABLET DAILY 90 tablet 1  . traZODone (DESYREL) 50 MG tablet Take 0.5-1 tablets (25-50 mg total) by mouth at bedtime as needed for sleep. 30 tablet 3   No facility-administered medications prior to visit.     Allergies: No Known Allergies  Social History   Socioeconomic History  . Marital status: Married    Spouse name: Not on file  . Number of children: Not on file  . Years of education: Not on file  . Highest education level: Not on file  Social Needs  . Financial resource strain: Not on file  . Food insecurity - worry: Not on file  . Food insecurity - inability: Not on file  . Transportation needs - medical: Not on file  . Transportation needs - non-medical: Not on file  Occupational History  . Not on file  Tobacco Use  . Smoking status: Former Smoker    Last attempt to quit: 03/09/1981    Years since quitting: 36.0   . Smokeless tobacco: Never Used  Substance and Sexual Activity  . Alcohol use: Yes    Alcohol/week: 1.2 oz    Types: 2 Cans of beer per week    Comment: occasionally  . Drug use: No  . Sexual activity: Not Currently  Other Topics Concern  . Not on file  Social History Narrative  . Not on file    Family History  Problem Relation Age of Onset  . Arthritis Mother   . Heart disease Mother   . Hyperlipidemia Mother   . Hypertension Mother   . Stroke Mother   . Vision loss Mother   . Arthritis Father   . Depression Father   . Heart disease Father   . Hyperlipidemia Father   . Stroke Father   . Arthritis Sister   . Cancer Sister   . Diabetes Sister   . Hyperlipidemia Sister   . Arthritis Brother   . Diabetes Brother   . Heart disease Brother   . Hyperlipidemia Brother   . Hypertension Brother   . Stomach cancer Maternal Grandfather   . Colon cancer Neg Hx   . Esophageal cancer Neg Hx   . Rectal cancer Neg Hx     Physical Exam: Blood pressure 128/82, pulse 61, temperature (!) 97.4 F (36.3 C), temperature source Oral, resp. rate 14, height 5' 5.5" (1.664 m), weight 83.5 kg (184 lb), SpO2 98 %.  General: Well developed, well nourished, in no acute distress. HEENT: Normocephalic, atraumatic. Conjunctiva pink, sclera non-icteric. Pupils 2 mm constricting to 1 mm, round, regular, and equally reactive to light and accomodation. EOMI. Internal auditory canal clear. TMs with good cone of light and without pathology. Nasal mucosa pink. Nares are without discharge. No sinus tenderness. Oral mucosa pink. Pharynx without exudate.   Neck: Supple. Trachea midline. No thyromegaly. Full ROM. No lymphadenopathy. Lungs: Clear to auscultation bilaterally without wheezes, rales, or rhonchi. Breathing is of normal effort and unlabored. Cardiovascular: RRR with S1 S2. No murmurs, rubs, or gallops. Distal pulses 2+ symmetrically. No carotid or abdominal bruits. Abdomen: Soft, non-tender,  non-distended with normoactive bowel sounds. No hepatosplenomegaly or masses. No rebound/guarding. No CVA tenderness. No hernias. Rectal: Per Urology Musculoskeletal:  -------Left Shoulder: He can extend forward with minimal pain. -------- abduction is slightly diminished and causes pain towards the anterior aspect of the shoulder and down the  anterior aspect of the bicep region. Forearm strength is 5/5 with abduction and abduction against resistance. Skin: Warm and moist without erythema, ecchymosis, wounds, or rash. Neuro: A+Ox3. CN II-XII grossly intact. Moves all extremities spontaneously. Full sensation throughout. Normal gait.  Psych:  Responds to questions appropriately with a normal affect.   Assessment/Plan:  66 y.o. y/o white male here for CPE  -1. Visit for preventive health examination  A. Screening Labs: 03/23/2017: He recently came and had fasting labs.   -----Reviewed all of these results with him today. All of these labs are good.  B. Screening For Prostate Cancer: 03/23/2017: He has history of prostate cancer and is being managed by urology for this. Therefore I will order no PSA here.  -----He reports he had prostate surgery 08/2013  C. Screening For Colorectal Cancer: Last colonoscopy was 03/11/2011. Polyps. Repeat 5 years. Due 2017.  At CPE 03/17/2016 I placed referral to GI (He says prior colonoscopy was performed prior to moving here--will establish with a new GI here ) 03/23/2017: He had colonoscopy by Dr. Carlean Purl 06/01/16.  Normal.  Repeat 10 years.  D. Immunizations: Flu---------- he is agreeable to receive influenza vaccine--- given here 03/10/15. 03/17/2016--he reports that he already received this at the local pharmacy Tetanus----he is now 68 and on Medicare and they do not cover this so we'll defer this. Pneumococcal--- at [place] 11/07/14 he reported he had had no pneumonia vaccine at that point. ------------------------------- At that visit we gave Prevnar 13 on  11/07/14. --------------------------------Has not been a full 6 months since that vaccine.  --------------------------------Pneumovax 23----Given here 03/17/2016 Zostavax------------------- received 04/23/2013  Medicare annual wellness visit, subsequent   Family history of abdominal aortic aneurysm (AAA) 03/17/2016---he reports that his father had abdominal aortic aneurysm. Will obtain screening for this. - US Aorta Initial Medicare Screen; Future 03/23/2017: Had ultrasound performed 03/2016 and this was negative for any aneurysm.  Plantar fasciitis of left foot 03/17/2016---He has been compliant with all of the treatments that I have provided that is still having symptoms. At this time we'll refer him to podiatry. - Ambulatory referral to Podiatry 03/23/2017: He does not have any complaint of plantar fasciitis symptoms at today's visit  Glaucoma Since he has moved to this area, he has established with an ophthalmologist and is seeing a glaucoma specialist here locally----he says that it is a group called [facility] and it is the same group that he saw in the past and they just recently opened a branch here in [place]. 03/23/2017: --- continues to follow-up with local ophthalmologist  Hyperlipidemia 03/17/2016---He is on simvastatin 20 mg. 03/23/2017: -----He had recent FLP and LFTs which were good. Continue current dose.  History of prostate cancer At his visit with me 11/07/14 I did referral to urology. He states that he has had follow-up with local urologist in [place]. 03/23/2017: ----he continues to follow-up with urology routinely.  Male impotence Per urology see #4 above  DDD (degenerative disc disease), lumbar  Left Shoulder Tendonitis 03/23/2017: I gave and reviewed handout with stretches for him to do at least once every single day.  Discussed that the more frequent he can do the stretches the better.  Left Calf Cramps: 03/23/2017: Demonstrated stretches for him to do for  calf stretches.  Discussed that probably more likely these cramps are secondary to tightness in his low back and hip.  Discussed specific stretches for him to do to stretch low back and hamstrings and hips.  Is to do these  at  least once every single day.   Subjective:   Patient presents for Medicare Annual/Subsequent preventive examination.   Review Past Medical/Family/Social: these are all reviewed and documented today.   Risk Factors  Current exercise habits:  He plays golf. He goes to gym--includes riding bike and walking. Dietary issues discussed: He is aware of low sodium low cholesterol low sugar diet  Cardiac risk factors:  Male, Age, Hyperlipidemia  Depression Screen  (Note: if answer to either of the following is "Yes", a more complete depression screening is indicated)  Over the past two weeks, have you felt down, depressed or hopeless? No Over the past two weeks, have you felt little interest or pleasure in doing things? No Have you lost interest or pleasure in daily life? No Do you often feel hopeless? No Do you cry easily over simple problems? No   Activities of Daily Living  In your present state of health, do you have any difficulty performing the following activities?:  Driving? No  Managing money? No  Feeding yourself? No  Getting from bed to chair? No  Climbing a flight of stairs? No  Preparing food and eating?: No  Bathing or showering? No  Getting dressed: No  Getting to the toilet? No  Using the toilet:No  Moving around from place to place: No  In the past year have you fallen or had a near fall?:No  Are you sexually active? No  Do you have more than one partner? No   Hearing Difficulties: No  Do you often ask people to speak up or repeat themselves? No  Do you experience ringing or noises in your ears? No Do you have difficulty understanding soft or whispered voices? No  Do you feel that you have a problem with memory? No Do you often misplace  items? No  Do you feel safe at home? Yes  Cognitive Testing  Alert? Yes Normal Appearance?Yes  Oriented to person? Yes Place? Yes  Time? Yes  Recall of three objects? Yes  Can perform simple calculations? Yes  Displays appropriate judgment?Yes  Can read the correct time from a watch face?Yes   List the Names of Other Physician/Practitioners you currently use:  See HPI  Indicate any recent Medical Services you may have received from other than [facility] providers in the past year (date may be approximate).  See HPI Screening Tests / Date-------------- All of this information is documented above Colonoscopy                     Zostavax  Mammogram---N/A  Influenza Vaccine  Tetanus/tdap    Assessment:    Annual wellness medicare exam   Plan:    During the course of the visit the patient was educated and counseled about appropriate screening and preventive services including:  Screening mammography  Colorectal cancer screening  Shingles vaccine. Prescription given to that she can get the vaccine at the pharmacy or Medicare part D.  Screen + for depression. PHQ- 9 score of 12 (moderate depression). We discussed the options of counseling versus possibly a medication. I encouraged her strongly think about the counseling. She is going through some medical problems currently and her husband is as well Mrs. been very stressful for her. She says she will think about it. She does have Xanax to use as needed. Though she may benefit from an SSRI for her more depressive type symptoms but she wants to hold off at this time.  I aksed her to please have  her cardioloist send records since we have none on file.  Diet review for nutrition referral? Yes ____ Not Indicated __x__  Patient Instructions (the written plan) was given to the patient.  Medicare Attestation  I have personally reviewed:  The patient's medical and social history  Their use of alcohol, tobacco or illicit drugs  Their current  medications and supplements  The patient's functional ability including ADLs,fall risks, home safety risks, cognitive, and hearing and visual impairment  Diet and physical activities  Evidence for depression or mood disorders  The patient's weight, height, BMI, and visual acuity have been recorded in the chart. I have made referrals, counseling, and provided education to the patient based on review of the above and I have provided the patient with a written personalized care plan for preventive services.     He is going to return fasting for lab work. Going to schedule follow-up office visit for cryotherapy to his head and biopsy of suspicious nevus on the chest  Signed:   [address] [place], [place]  03/23/2017 8:46 AM

## 2017-03-24 ENCOUNTER — Other Ambulatory Visit: Payer: Self-pay

## 2017-03-24 ENCOUNTER — Encounter: Payer: Self-pay | Admitting: Physician Assistant

## 2017-03-24 MED ORDER — GABAPENTIN 600 MG PO TABS: 600.0000 mg | ORAL_TABLET | Freq: Two times a day (BID) | ORAL | 3 refills | Status: DC

## 2017-03-24 MED ORDER — SIMVASTATIN 20 MG PO TABS: 20.0000 mg | ORAL_TABLET | Freq: Every day | ORAL | 1 refills | Status: DC

## 2017-03-24 NOTE — Telephone Encounter (Signed)
Rx was sent to the wrong pharmacy.

## 2017-04-12 DIAGNOSIS — C61 Malignant neoplasm of prostate: Secondary | ICD-10-CM | POA: Diagnosis not present

## 2017-04-15 DIAGNOSIS — N5231 Erectile dysfunction following radical prostatectomy: Secondary | ICD-10-CM | POA: Diagnosis not present

## 2017-04-15 DIAGNOSIS — C61 Malignant neoplasm of prostate: Secondary | ICD-10-CM | POA: Diagnosis not present

## 2017-04-18 DIAGNOSIS — M19012 Primary osteoarthritis, left shoulder: Secondary | ICD-10-CM | POA: Diagnosis not present

## 2017-04-21 ENCOUNTER — Other Ambulatory Visit: Payer: Self-pay

## 2017-04-22 DIAGNOSIS — H401222 Low-tension glaucoma, left eye, moderate stage: Secondary | ICD-10-CM | POA: Diagnosis not present

## 2017-06-14 DIAGNOSIS — M9903 Segmental and somatic dysfunction of lumbar region: Secondary | ICD-10-CM | POA: Diagnosis not present

## 2017-06-14 DIAGNOSIS — M5432 Sciatica, left side: Secondary | ICD-10-CM | POA: Diagnosis not present

## 2017-06-30 DIAGNOSIS — M9903 Segmental and somatic dysfunction of lumbar region: Secondary | ICD-10-CM | POA: Diagnosis not present

## 2017-06-30 DIAGNOSIS — M5432 Sciatica, left side: Secondary | ICD-10-CM | POA: Diagnosis not present

## 2017-07-18 DIAGNOSIS — M19012 Primary osteoarthritis, left shoulder: Secondary | ICD-10-CM | POA: Diagnosis not present

## 2017-07-21 DIAGNOSIS — M9903 Segmental and somatic dysfunction of lumbar region: Secondary | ICD-10-CM | POA: Diagnosis not present

## 2017-07-21 DIAGNOSIS — M5432 Sciatica, left side: Secondary | ICD-10-CM | POA: Diagnosis not present

## 2017-07-27 DIAGNOSIS — M9903 Segmental and somatic dysfunction of lumbar region: Secondary | ICD-10-CM | POA: Diagnosis not present

## 2017-07-27 DIAGNOSIS — M5432 Sciatica, left side: Secondary | ICD-10-CM | POA: Diagnosis not present

## 2017-08-06 IMAGING — US US AORTA SCREENING (MEDICARE)
1 series · 13 of 13 positions shown · non-contrast
Comparison: None.

CLINICAL DATA: 65-year-old male. Patient with family history of AAA
(father). History smoking, quit [id number]. Smoked for 15 years. Initial
encounter.

EXAM:
US ABDOMINAL AORTA MEDICARE SCREENING
TECHNIQUE: Ultrasound examination of the abdominal aorta was performed as a
screening evaluation for abdominal aortic aneurysm.

[Series 1: us aorta screening (medicare) · 0.37mm/px · 13 of 13 slices shown]
[im 1/13]
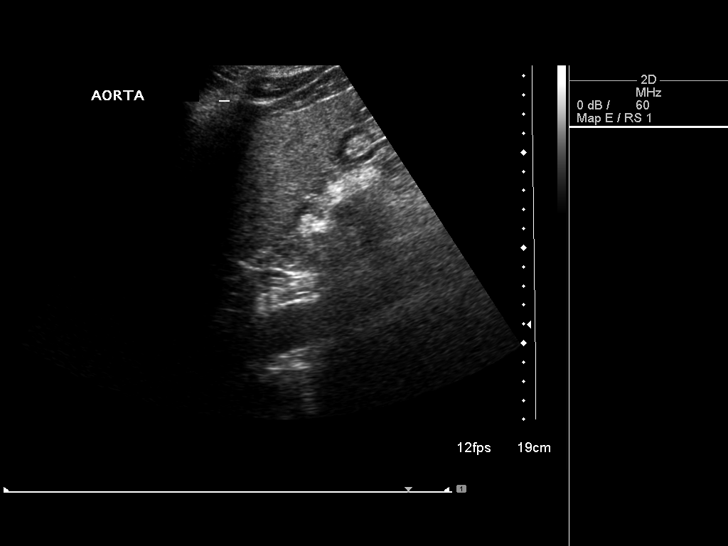
[im 2/13]
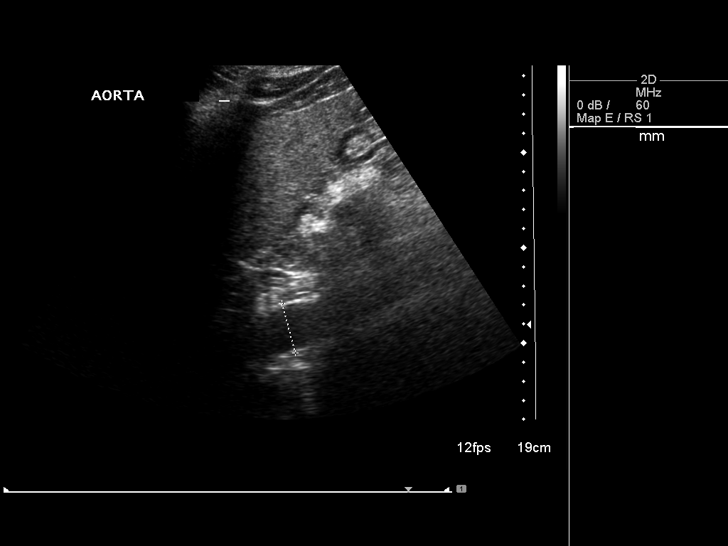
[im 3/13]
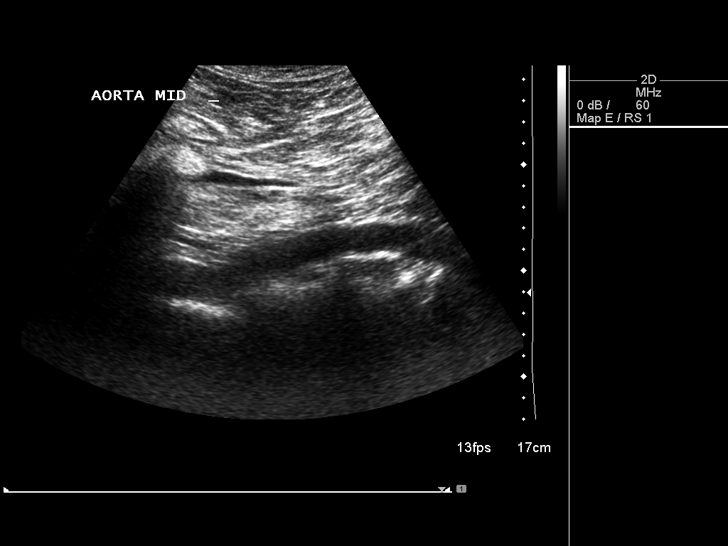
[im 4/13]
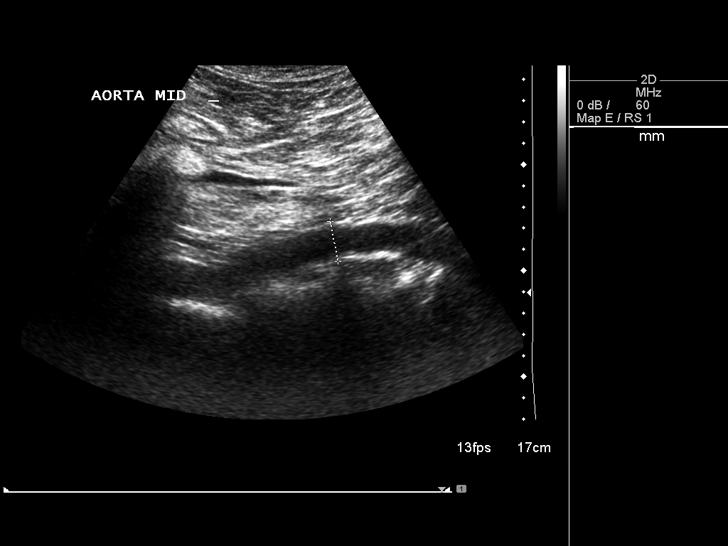
[im 5/13]
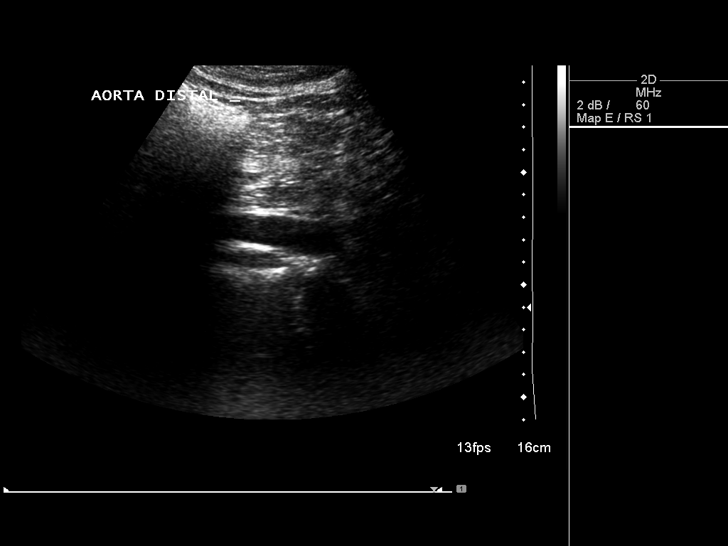
[im 6/13]
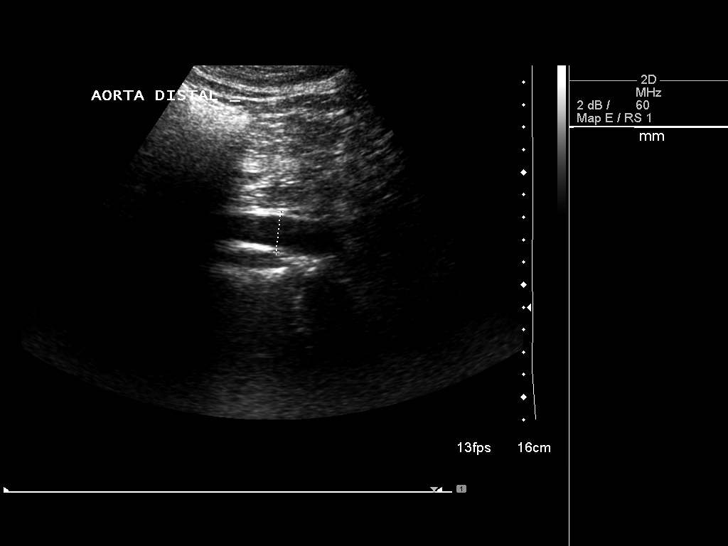
[im 7/13]
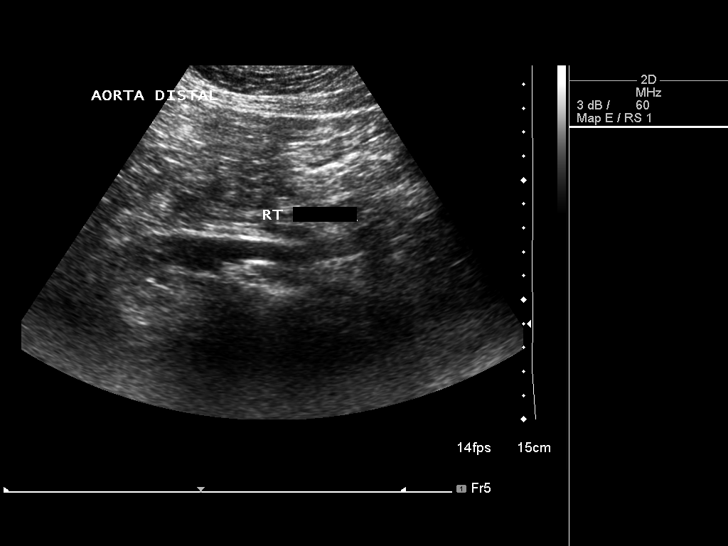
[im 8/13]
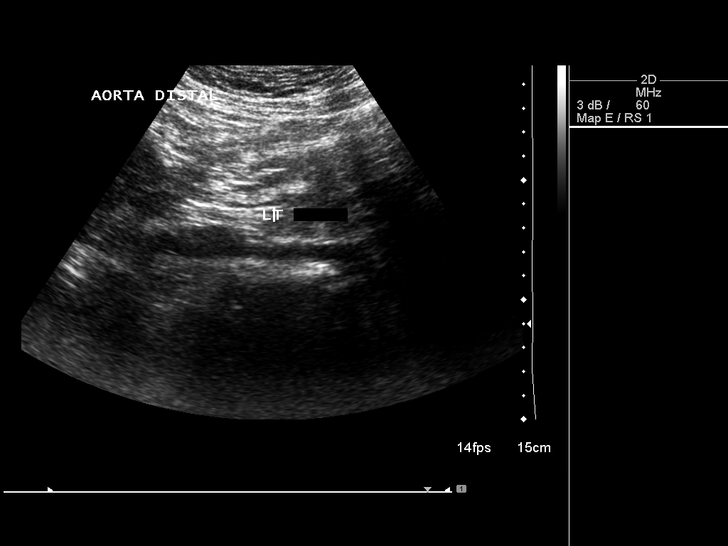
[im 9/13]
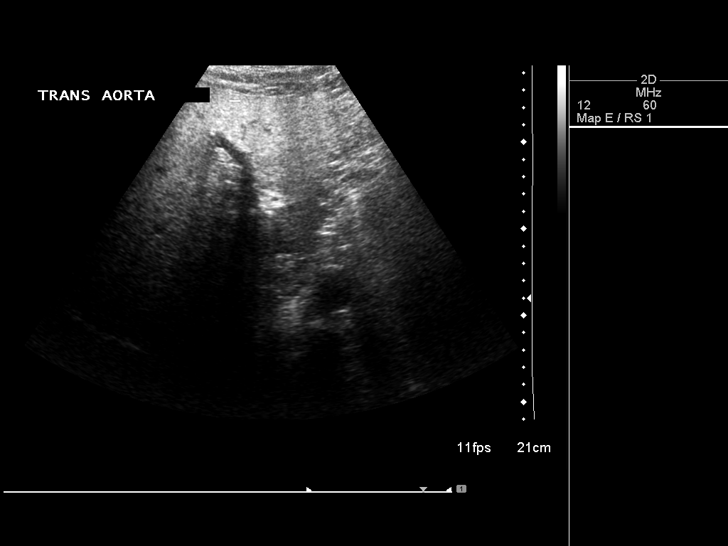
[im 10/13]
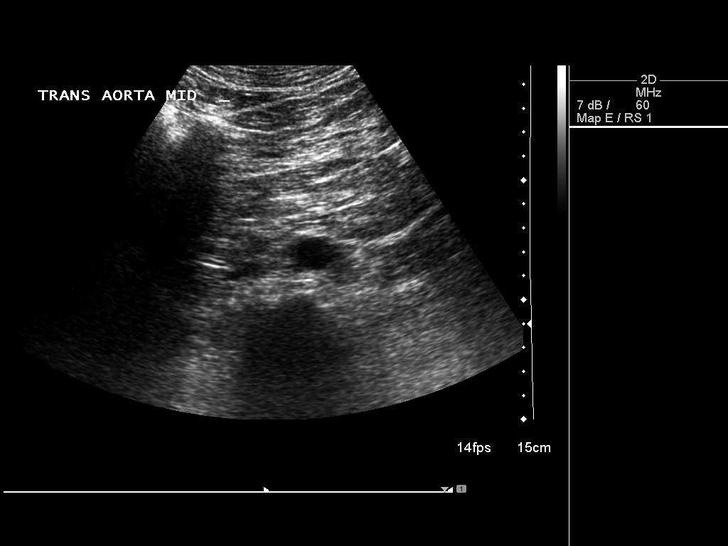
[im 11/13]
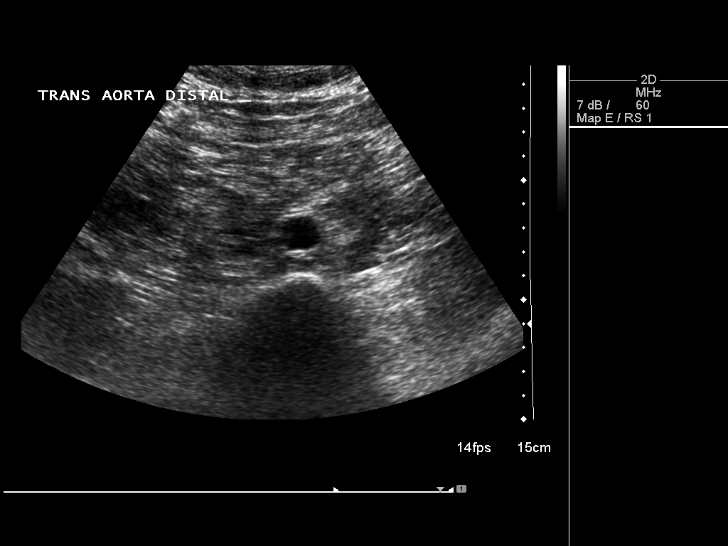
[im 12/13]
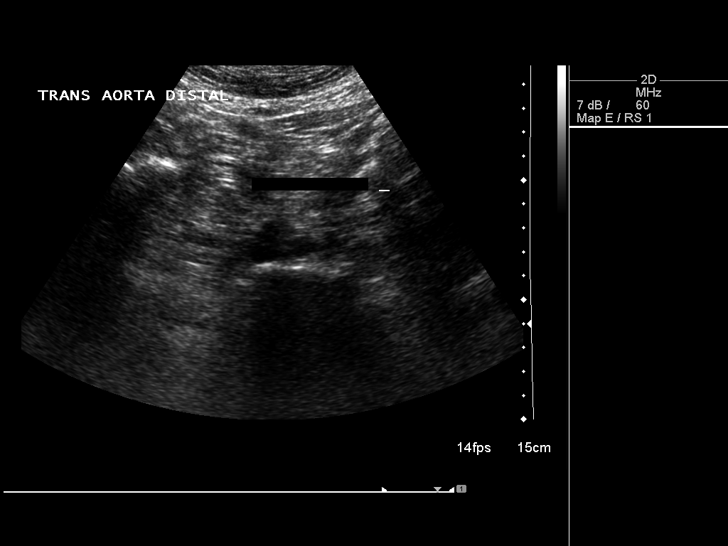
[im 13/13]
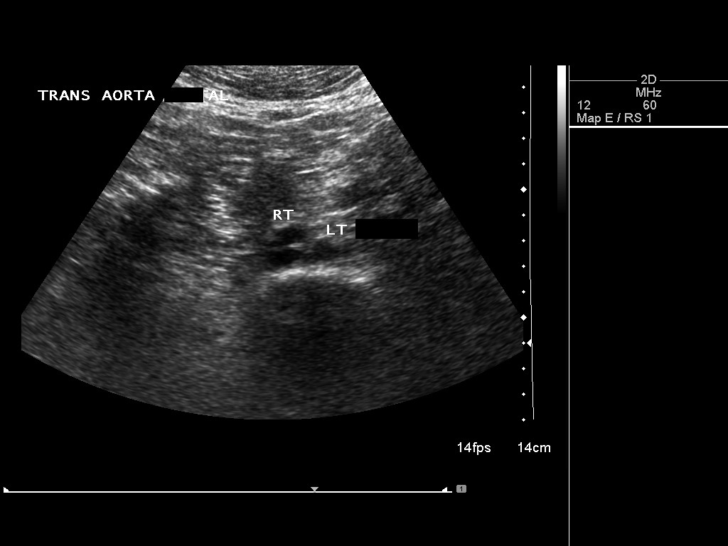

[13 of 13 positions shown; findings below may reference images not displayed]

FINDINGS: Maximum Diameter: 2.7 cm

ABDOMINAL AORTA

Proximal:  2.7 cm

Mid:  1.9 cm

Distal:  1.8 cm
IMPRESSION: No evidence of abdominal aortic aneurysm.

## 2017-08-10 DIAGNOSIS — M9903 Segmental and somatic dysfunction of lumbar region: Secondary | ICD-10-CM | POA: Diagnosis not present

## 2017-08-10 DIAGNOSIS — M5432 Sciatica, left side: Secondary | ICD-10-CM | POA: Diagnosis not present

## 2017-09-14 ENCOUNTER — Ambulatory Visit: Payer: Self-pay | Admitting: Physician Assistant

## 2017-09-19 ENCOUNTER — Ambulatory Visit (INDEPENDENT_AMBULATORY_CARE_PROVIDER_SITE_OTHER): Payer: Medicare Other | Admitting: Physician Assistant

## 2017-09-19 ENCOUNTER — Other Ambulatory Visit: Payer: Self-pay

## 2017-09-19 ENCOUNTER — Encounter: Payer: Self-pay | Admitting: Physician Assistant

## 2017-09-19 VITALS — BP 128/82 | HR 51 | Temp 97.3°F | Resp 18 | Ht 69.0 in | Wt 183.2 lb

## 2017-09-19 DIAGNOSIS — E785 Hyperlipidemia, unspecified: Secondary | ICD-10-CM | POA: Diagnosis not present

## 2017-09-19 DIAGNOSIS — M5136 Other intervertebral disc degeneration, lumbar region: Secondary | ICD-10-CM

## 2017-09-19 DIAGNOSIS — M25512 Pain in left shoulder: Secondary | ICD-10-CM | POA: Diagnosis not present

## 2017-09-19 DIAGNOSIS — G8929 Other chronic pain: Secondary | ICD-10-CM

## 2017-09-19 LAB — COMPLETE METABOLIC PANEL WITH GFR
AG Ratio: 1.7 (calc) (ref 1.0–2.5)
ALBUMIN MSPROF: 4.2 g/dL (ref 3.6–5.1)
ALKALINE PHOSPHATASE (APISO): 78 U/L (ref 40–115)
ALT: 26 U/L (ref 9–46)
AST: 21 U/L (ref 10–35)
BUN: 16 mg/dL (ref 7–25)
CHLORIDE: 106 mmol/L (ref 98–110)
CO2: 31 mmol/L (ref 20–32)
CREATININE: 0.96 mg/dL (ref 0.70–1.25)
Calcium: 9.7 mg/dL (ref 8.6–10.3)
GFR, Est African American: 94 mL/min/{1.73_m2} (ref >=60)
GFR, Est Non African American: 81 mL/min/{1.73_m2} (ref >=60)
GLUCOSE: 81 mg/dL (ref 65–99)
Globulin: 2.5 g/dL (calc) (ref 1.9–3.7)
Potassium: 4.5 mmol/L (ref 3.5–5.3)
Sodium: 141 mmol/L (ref 135–146)
Total Bilirubin: 0.4 mg/dL (ref 0.2–1.2)
Total Protein: 6.7 g/dL (ref 6.1–8.1)

## 2017-09-19 LAB — EXTRA LAV TOP TUBE

## 2017-09-19 MED ORDER — SIMVASTATIN 20 MG PO TABS: 20.0000 mg | ORAL_TABLET | Freq: Every day | ORAL | 1 refills | Status: DC

## 2017-09-19 MED ORDER — GABAPENTIN 600 MG PO TABS: 600.0000 mg | ORAL_TABLET | Freq: Two times a day (BID) | ORAL | 3 refills | Status: DC

## 2017-09-19 NOTE — Progress Notes (Signed)
Patient ID: Kyle Wilcox MRN: [id number], DOB: 1950/07/29, 67 y.o. Date of Encounter: 09/19/2017, 10:45 AM    Chief Complaint:  Chief Complaint  Patient presents with  . discuss cholesterol  . leg cramps    started a couple of months ago     HPI: 67 y.o. year old male presents for above.   I reviewed his chart.  Reviewed that he usually comes in annually for Medicare CPE.  Last visit was 03/23/2017 for Medicare physical. Today he reports that he recently saw on "my chart" that he was due for 49-month office visit so scheduled 6 month OV.  Here for that 4-month follow-up visit.  Reports that he continues to take the simvastatin daily for cholesterol.  He reports that he has been having problems with pain in his left shoulder and is seeing Dr. Mardelle Matte for this.  States that he is scheduled to have another steroid shot next month.  States that he thinks he has had one prior injection this year and a total of 2 prior injections one last year and one this year so far.  Says that he cannot tell for sure how much they are helping.  He does report that he sometimes has leg cramps in his right calf.  States that he only feels these at nighttime. Reviewed that at prior visit he had told me he had history of sciatica. Today I discussed that statins can cause myalgias but generally these are more diffuse and usually occur in more than one muscle area and would usually occur throughout the day not just at nighttime.   Discussed that the cramps he is having may be secondary to the sciatica. Recommend that he have a trial off of statin for 2 weeks and then call me and let me know whether this cramps/symptoms resolve.  Even if the symptoms do resolve and is felt that the cramps are secondary to simvastatin, then would try pravastatin.  He has no other specific concerns today.  States that otherwise things have been stable.     Home Meds:   Outpatient Medications Prior to Visit  Medication Sig  Dispense Refill  . aspirin (ASPIR-81) 81 MG EC tablet Take 81 mg by mouth daily. Swallow whole.    . Bimatoprost (LUMIGAN OP) Apply 5 mLs to eye 2 (two) times daily.    . Brinzolamide-Brimonidine (SIMBRINZA) 1-0.2 % SUSP Apply to eye.    . meloxicam (MOBIC) 7.5 MG tablet Take 1 tablet (7.5 mg total) daily by mouth. 90 tablet 3  . Methylcellulose, Laxative, (FIBER THERAPY PO) Take 0.52 mg by mouth 2 (two) times daily.    . ONE DAILY MULTIPLE VITAMIN PO Take by mouth.    . traZODone (DESYREL) 50 MG tablet Take 0.5-1 tablets (25-50 mg total) at bedtime as needed by mouth for sleep. 90 tablet 1  . gabapentin (NEURONTIN) 600 MG tablet Take 1 tablet (600 mg total) 2 (two) times daily by mouth. 180 tablet 3  . simvastatin (ZOCOR) 20 MG tablet Take 1 tablet (20 mg total) daily by mouth. 90 tablet 1  . brimonidine (ALPHAGAN) 0.2 % ophthalmic solution     . brinzolamide (AZOPT) 1 % ophthalmic suspension Place 1 drop into both eyes 2 (two) times daily.     No facility-administered medications prior to visit.     Allergies: No Known Allergies    Review of Systems: See HPI for pertinent ROS. All other ROS negative.    Physical Exam: Blood pressure 128/82,  pulse (!) 51, temperature (!) 97.3 F (36.3 C), temperature source Oral, resp. rate 18, height 5\' 9"  (1.753 m), weight 83.1 kg (183 lb 3.2 oz), SpO2 97 %., Body mass index is 27.05 kg/m. General:  WNWD WM. Appears in no acute distress. Neck: Supple. No thyromegaly. No lymphadenopathy. Lungs: Clear bilaterally to auscultation without wheezes, rales, or rhonchi. Breathing is unlabored. Heart: Regular rhythm. No murmurs, rubs, or gallops. Abdomen: Soft, non-tender, non-distended with normoactive bowel sounds. No hepatomegaly. No rebound/guarding. No obvious abdominal masses. Msk:  Strength and tone normal for age. Extremities/Skin: Warm and dry.  Neuro: Alert and oriented X 3. Moves all extremities spontaneously. Gait is normal. CNII-XII grossly in  tact. Psych:  Responds to questions appropriately with a normal affect.     ASSESSMENT AND PLAN:  67 y.o. year old male with  1. DDD (degenerative disc disease), lumbar Takes Mobic and gabapentin to help with the symptoms.  Will check labs to monitor. - COMPLETE METABOLIC PANEL WITH GFR  2. Hyperlipidemia, unspecified hyperlipidemia type Is  on simvastatin.  Check LFTs to monitor.  Is not fasting today.  Did have FLP at last visit 03/23/2017. See HPI regarding cramps in calf of right leg.  Trial off statin for 2 weeks then call and inform me of findings. - COMPLETE METABOLIC PANEL WITH GFR  3. Chronic left shoulder pain Takes Mobic and gabapentin.  Check labs to monitor. - COMPLETE METABOLIC PANEL WITH GFR   Signed, [address] [place], [place], [facility] 09/19/2017 10:45 AM

## 2017-10-17 DIAGNOSIS — M19012 Primary osteoarthritis, left shoulder: Secondary | ICD-10-CM | POA: Diagnosis not present

## 2017-10-26 DIAGNOSIS — H401222 Low-tension glaucoma, left eye, moderate stage: Secondary | ICD-10-CM | POA: Diagnosis not present

## 2017-10-26 DIAGNOSIS — H401213 Low-tension glaucoma, right eye, severe stage: Secondary | ICD-10-CM | POA: Diagnosis not present

## 2017-12-01 DIAGNOSIS — M9903 Segmental and somatic dysfunction of lumbar region: Secondary | ICD-10-CM | POA: Diagnosis not present

## 2017-12-01 DIAGNOSIS — M5432 Sciatica, left side: Secondary | ICD-10-CM | POA: Diagnosis not present

## 2017-12-28 DIAGNOSIS — M9903 Segmental and somatic dysfunction of lumbar region: Secondary | ICD-10-CM | POA: Diagnosis not present

## 2017-12-28 DIAGNOSIS — M5432 Sciatica, left side: Secondary | ICD-10-CM | POA: Diagnosis not present

## 2018-01-03 ENCOUNTER — Encounter: Payer: Self-pay | Admitting: Physician Assistant

## 2018-01-04 ENCOUNTER — Ambulatory Visit (INDEPENDENT_AMBULATORY_CARE_PROVIDER_SITE_OTHER): Payer: Medicare Other

## 2018-01-04 ENCOUNTER — Emergency Department (HOSPITAL_COMMUNITY): Payer: Medicare Other

## 2018-01-04 ENCOUNTER — Encounter (HOSPITAL_COMMUNITY): Payer: Self-pay | Admitting: *Deleted

## 2018-01-04 ENCOUNTER — Observation Stay (HOSPITAL_COMMUNITY): Payer: Medicare Other

## 2018-01-04 ENCOUNTER — Ambulatory Visit: Payer: Medicare Other | Admitting: Physician Assistant

## 2018-01-04 ENCOUNTER — Encounter (HOSPITAL_COMMUNITY): Payer: Self-pay | Admitting: Emergency Medicine

## 2018-01-04 ENCOUNTER — Other Ambulatory Visit: Payer: Self-pay

## 2018-01-04 ENCOUNTER — Ambulatory Visit (INDEPENDENT_AMBULATORY_CARE_PROVIDER_SITE_OTHER)
Admission: EM | Admit: 2018-01-04 | Discharge: 2018-01-04 | Disposition: A | Discharge status: Home / Self Care | Payer: Medicare Other | Source: Home / Self Care | Attending: Family Medicine | Admitting: Family Medicine

## 2018-01-04 ENCOUNTER — Inpatient Hospital Stay (HOSPITAL_COMMUNITY)
Admission: EM | Admit: 2018-01-04 | Discharge: 2018-01-13 | DRG: 336 | Disposition: A | Discharge status: Home / Self Care | Payer: Medicare Other | Attending: Surgery | Admitting: Surgery

## 2018-01-04 DIAGNOSIS — K567 Ileus, unspecified: Secondary | ICD-10-CM | POA: Diagnosis not present

## 2018-01-04 DIAGNOSIS — Z800 Family history of malignant neoplasm of digestive organs: Secondary | ICD-10-CM

## 2018-01-04 DIAGNOSIS — R1084 Generalized abdominal pain: Secondary | ICD-10-CM

## 2018-01-04 DIAGNOSIS — R1030 Lower abdominal pain, unspecified: Secondary | ICD-10-CM | POA: Diagnosis not present

## 2018-01-04 DIAGNOSIS — E785 Hyperlipidemia, unspecified: Secondary | ICD-10-CM | POA: Diagnosis present

## 2018-01-04 DIAGNOSIS — Z8349 Family history of other endocrine, nutritional and metabolic diseases: Secondary | ICD-10-CM

## 2018-01-04 DIAGNOSIS — Z87891 Personal history of nicotine dependence: Secondary | ICD-10-CM | POA: Diagnosis not present

## 2018-01-04 DIAGNOSIS — Z7982 Long term (current) use of aspirin: Secondary | ICD-10-CM

## 2018-01-04 DIAGNOSIS — Z9852 Vasectomy status: Secondary | ICD-10-CM

## 2018-01-04 DIAGNOSIS — Z821 Family history of blindness and visual loss: Secondary | ICD-10-CM

## 2018-01-04 DIAGNOSIS — K56699 Other intestinal obstruction unspecified as to partial versus complete obstruction: Secondary | ICD-10-CM | POA: Diagnosis not present

## 2018-01-04 DIAGNOSIS — R140 Abdominal distension (gaseous): Secondary | ICD-10-CM | POA: Diagnosis present

## 2018-01-04 DIAGNOSIS — K56609 Unspecified intestinal obstruction, unspecified as to partial versus complete obstruction: Secondary | ICD-10-CM

## 2018-01-04 DIAGNOSIS — M549 Dorsalgia, unspecified: Secondary | ICD-10-CM | POA: Diagnosis not present

## 2018-01-04 DIAGNOSIS — K56600 Partial intestinal obstruction, unspecified as to cause: Secondary | ICD-10-CM | POA: Diagnosis not present

## 2018-01-04 DIAGNOSIS — Z823 Family history of stroke: Secondary | ICD-10-CM

## 2018-01-04 DIAGNOSIS — K5651 Intestinal adhesions [bands], with partial obstruction: Principal | ICD-10-CM | POA: Diagnosis present

## 2018-01-04 DIAGNOSIS — Z8546 Personal history of malignant neoplasm of prostate: Secondary | ICD-10-CM

## 2018-01-04 DIAGNOSIS — Z818 Family history of other mental and behavioral disorders: Secondary | ICD-10-CM

## 2018-01-04 DIAGNOSIS — K760 Fatty (change of) liver, not elsewhere classified: Secondary | ICD-10-CM | POA: Diagnosis not present

## 2018-01-04 DIAGNOSIS — Z8261 Family history of arthritis: Secondary | ICD-10-CM

## 2018-01-04 DIAGNOSIS — H409 Unspecified glaucoma: Secondary | ICD-10-CM | POA: Diagnosis present

## 2018-01-04 DIAGNOSIS — Z4682 Encounter for fitting and adjustment of non-vascular catheter: Secondary | ICD-10-CM | POA: Diagnosis not present

## 2018-01-04 DIAGNOSIS — K559 Vascular disorder of intestine, unspecified: Secondary | ICD-10-CM | POA: Diagnosis not present

## 2018-01-04 DIAGNOSIS — J302 Other seasonal allergic rhinitis: Secondary | ICD-10-CM | POA: Diagnosis present

## 2018-01-04 DIAGNOSIS — F5104 Psychophysiologic insomnia: Secondary | ICD-10-CM | POA: Diagnosis present

## 2018-01-04 DIAGNOSIS — G629 Polyneuropathy, unspecified: Secondary | ICD-10-CM | POA: Diagnosis present

## 2018-01-04 DIAGNOSIS — M5136 Other intervertebral disc degeneration, lumbar region: Secondary | ICD-10-CM | POA: Diagnosis present

## 2018-01-04 DIAGNOSIS — Z833 Family history of diabetes mellitus: Secondary | ICD-10-CM

## 2018-01-04 DIAGNOSIS — Z86010 Personal history of colonic polyps: Secondary | ICD-10-CM

## 2018-01-04 DIAGNOSIS — D62 Acute posthemorrhagic anemia: Secondary | ICD-10-CM | POA: Diagnosis not present

## 2018-01-04 DIAGNOSIS — Z9079 Acquired absence of other genital organ(s): Secondary | ICD-10-CM

## 2018-01-04 DIAGNOSIS — Z96611 Presence of right artificial shoulder joint: Secondary | ICD-10-CM | POA: Diagnosis present

## 2018-01-04 DIAGNOSIS — Z0189 Encounter for other specified special examinations: Secondary | ICD-10-CM

## 2018-01-04 DIAGNOSIS — Z79899 Other long term (current) drug therapy: Secondary | ICD-10-CM

## 2018-01-04 DIAGNOSIS — Z8249 Family history of ischemic heart disease and other diseases of the circulatory system: Secondary | ICD-10-CM

## 2018-01-04 LAB — CBC
HCT: 53.1 % — ABNORMAL HIGH (ref 39.0–52.0)
HEMOGLOBIN: 17.1 g/dL — AB (ref 13.0–17.0)
MCH: 30.1 pg (ref 26.0–34.0)
MCHC: 32.2 g/dL (ref 30.0–36.0)
MCV: 93.5 fL (ref 78.0–100.0)
Platelets: 225 10*3/uL (ref 150–400)
RBC: 5.68 MIL/uL (ref 4.22–5.81)
RDW: 13.3 % (ref 11.5–15.5)
WBC: 7.1 10*3/uL (ref 4.0–10.5)

## 2018-01-04 LAB — COMPREHENSIVE METABOLIC PANEL
ALBUMIN: 4.2 g/dL (ref 3.5–5.0)
ALT: 28 U/L (ref 0–44)
ANION GAP: 12 (ref 5–15)
AST: 26 U/L (ref 15–41)
Alkaline Phosphatase: 74 U/L (ref 38–126)
BUN: 14 mg/dL (ref 8–23)
CHLORIDE: 101 mmol/L (ref 98–111)
CO2: 25 mmol/L (ref 22–32)
CREATININE: 1.02 mg/dL (ref 0.61–1.24)
Calcium: 9.8 mg/dL (ref 8.9–10.3)
GFR calc non Af Amer: >60 mL/min (ref >60)
GLUCOSE: 108 mg/dL — AB (ref 70–99)
Potassium: 3.8 mmol/L (ref 3.5–5.1)
SODIUM: 138 mmol/L (ref 135–145)
Total Bilirubin: 0.8 mg/dL (ref 0.3–1.2)
Total Protein: 7.7 g/dL (ref 6.5–8.1)

## 2018-01-04 LAB — URINALYSIS, ROUTINE W REFLEX MICROSCOPIC
BILIRUBIN URINE: NEGATIVE
GLUCOSE, UA: NEGATIVE mg/dL
HGB URINE DIPSTICK: NEGATIVE
Ketones, ur: NEGATIVE mg/dL
Leukocytes, UA: NEGATIVE
Nitrite: NEGATIVE
Protein, ur: NEGATIVE mg/dL
pH: 6 (ref 5.0–8.0)

## 2018-01-04 LAB — LIPASE, BLOOD: LIPASE: 25 U/L (ref 11–51)

## 2018-01-04 MED ORDER — ONDANSETRON HCL 4 MG/2ML IJ SOLN
4.0000 mg | Freq: Four times a day (QID) | INTRAMUSCULAR | Status: DC | PRN
  Administered 2018-01-06: 4 mg via INTRAVENOUS

## 2018-01-04 MED ORDER — ENOXAPARIN SODIUM 40 MG/0.4ML ~~LOC~~ SOLN
40.0000 mg | SUBCUTANEOUS | Status: DC
  Administered 2018-01-06 – 2018-01-09 (×4): 40 mg via SUBCUTANEOUS
  Filled 2018-01-04 (×8): qty 0.4

## 2018-01-04 MED ORDER — ACETAMINOPHEN 650 MG RE SUPP: 650.0000 mg | Freq: Four times a day (QID) | RECTAL | Status: DC | PRN

## 2018-01-04 MED ORDER — ONDANSETRON 4 MG PO TBDP: 4.0000 mg | ORAL_TABLET | Freq: Four times a day (QID) | ORAL | Status: DC | PRN

## 2018-01-04 MED ORDER — SODIUM CHLORIDE 0.9 % IV BOLUS
1000.0000 mL | Freq: Once | INTRAVENOUS | Status: AC
  Administered 2018-01-04: 1000 mL via INTRAVENOUS

## 2018-01-04 MED ORDER — DIPHENHYDRAMINE HCL 50 MG/ML IJ SOLN
12.5000 mg | Freq: Four times a day (QID) | INTRAMUSCULAR | Status: DC | PRN
  Administered 2018-01-12: 12.5 mg via INTRAVENOUS
  Filled 2018-01-04: qty 1

## 2018-01-04 MED ORDER — DIPHENHYDRAMINE HCL 12.5 MG/5ML PO ELIX: 12.5000 mg | ORAL_SOLUTION | Freq: Four times a day (QID) | ORAL | Status: DC | PRN

## 2018-01-04 MED ORDER — HYDRALAZINE HCL 20 MG/ML IJ SOLN: 10.0000 mg | INTRAMUSCULAR | Status: DC | PRN

## 2018-01-04 MED ORDER — ONDANSETRON HCL 4 MG/2ML IJ SOLN
4.0000 mg | Freq: Once | INTRAMUSCULAR | Status: AC
  Administered 2018-01-04: 4 mg via INTRAVENOUS
  Filled 2018-01-04: qty 2

## 2018-01-04 MED ORDER — KCL IN DEXTROSE-NACL 20-5-0.45 MEQ/L-%-% IV SOLN
INTRAVENOUS | Status: DC
  Administered 2018-01-04 – 2018-01-07 (×8): via INTRAVENOUS
  Administered 2018-01-08: 1 mL via INTRAVENOUS
  Administered 2018-01-09 – 2018-01-11 (×5): via INTRAVENOUS
  Filled 2018-01-04 (×17): qty 1000

## 2018-01-04 MED ORDER — DIATRIZOATE MEGLUMINE & SODIUM 66-10 % PO SOLN
90.0000 mL | Freq: Once | ORAL | Status: AC
  Administered 2018-01-04: 90 mL via NASOGASTRIC
  Filled 2018-01-04: qty 90

## 2018-01-04 MED ORDER — IOPAMIDOL (ISOVUE-300) INJECTION 61%
100.0000 mL | Freq: Once | INTRAVENOUS | Status: AC | PRN
  Administered 2018-01-04: 100 mL via INTRAVENOUS

## 2018-01-04 MED ORDER — IOPAMIDOL (ISOVUE-300) INJECTION 61%
INTRAVENOUS | Status: AC
  Filled 2018-01-04: qty 100

## 2018-01-04 MED ORDER — MORPHINE SULFATE (PF) 2 MG/ML IV SOLN
2.0000 mg | INTRAVENOUS | Status: DC | PRN
  Administered 2018-01-05 – 2018-01-09 (×12): 2 mg via INTRAVENOUS
  Filled 2018-01-04 (×12): qty 1

## 2018-01-04 MED ORDER — LATANOPROST 0.005 % OP SOLN
1.0000 [drp] | Freq: Every day | OPHTHALMIC | Status: DC
  Administered 2018-01-06 – 2018-01-07 (×2): 1 [drp] via OPHTHALMIC
  Filled 2018-01-04: qty 2.5

## 2018-01-04 MED ORDER — MORPHINE SULFATE (PF) 4 MG/ML IV SOLN
4.0000 mg | Freq: Once | INTRAVENOUS | Status: AC
  Administered 2018-01-04: 4 mg via INTRAVENOUS
  Filled 2018-01-04: qty 1

## 2018-01-04 NOTE — ED Notes (Signed)
ED Provider at bedside. 

## 2018-01-04 NOTE — ED Notes (Signed)
Patient transported to CT 

## 2018-01-04 NOTE — Discharge Instructions (Signed)
Recommend further evaluation and management at the ED Patient aware and in agreement with this plan

## 2018-01-04 NOTE — H&P (Addendum)
[facility] Admission Note  Kyle Wilcox 06/27/1950  [id number].    Requesting MD: Marda Stalker Chief Complaint/Reason for Consult: SBO  HPI:  Kyle Wilcox is a 67yo male who was sent from urgent care to [facility] earlier today with 3 days of gradually worsening abdominal pain. Pain described as "all the way around" his lower abdomen. Denies similar pain in the past. It is constant and sharp in nature, without radiation. He intitially thought he had a virus because family members had recently been ill, but his symptoms were not consistent with theirs. He has a history of constipation, so he took dulcolax and a suppository without much relief. He has tried Gas-X and antacid but this did not help. Over the past couple of days he reports worsening pain and distention. Minimal appetite. This morning he had one episode of vomiting. States he passed gas today and had a watery, non-bloody BM this AM. He has a history of prostate CA s/p total prostatectomy in 2015 without radiation/chemo. He states his last colonoscopy was in 2017 by Dr. Lorayne Bender and was negative for polyps or malignancy. Reports benign polyps on previous colonoscopy. Denies family or personal history of colon cancer. Denies use of blood thinners other than baby ASA. Reports a history of hernia since 2008. He is a former smoker who quit in 1982. Reports drinking a beer occasionally but does not drink daily. Denies illicit drug use.    ED workup included CT scan abdomen/pelvis which is concerning for a possible partial small bowel obstruction, however, the small bowel proximal to the point of obstruction is decompressed; abnormal appearance of the small bowel in the left mid abdomen where there is a transition in small bowel caliber concerning for inflammation or potentially neoplastic disease; questionable pancreatic tail mass or possibly an accessory spleen abutting the pancreatic tail. WBC 7.1, hemoglobin/hematocrit 17.1/53.1.  Lactic acid pending.  ROS: Review of Systems  Constitutional: Positive for diaphoresis. Negative for fever and weight loss.  Gastrointestinal: Positive for abdominal pain, constipation, diarrhea, nausea and vomiting. Negative for blood in stool and melena.  All other systems reviewed and are negative.  All systems reviewed and otherwise negative except for as above  Family History  Problem Relation Age of Onset  . Arthritis Mother   . Heart disease Mother   . Hyperlipidemia Mother   . Hypertension Mother   . Stroke Mother   . Vision loss Mother   . Arthritis Father   . Depression Father   . Heart disease Father   . Hyperlipidemia Father   . Stroke Father   . Arthritis Sister   . Cancer Sister   . Diabetes Sister   . Hyperlipidemia Sister   . Arthritis Brother   . Diabetes Brother   . Heart disease Brother   . Hyperlipidemia Brother   . Hypertension Brother   . Stomach cancer Maternal Grandfather   . Colon cancer Neg Hx   . Esophageal cancer Neg Hx   . Rectal cancer Neg Hx     Past Medical History:  Diagnosis Date  . Allergy 2014  . Cancer [facility]) 2015   prostate cancer  . Cataract 2016  . DDD (degenerative disc disease), lumbar 12/24/2014  . Glaucoma   . Hx of colonic polyps 02/19/2011  . Hyperlipidemia 11/07/2014  . Peripheral neuropathy 12/24/2014    Past Surgical History:  Procedure Laterality Date  . COLONOSCOPY    . EYE SURGERY     laser surgery to relieve pressure on  eyes- both eyes  . JOINT REPLACEMENT  2011   total shoulder replacement (Right)  . PROSTATE SURGERY     2015  . VASECTOMY  1982    Social History:  reports that he quit smoking about 36 years ago. He has never used smokeless tobacco. He reports that he drinks about 2.0 standard drinks of alcohol per week. He reports that he does not use drugs.  Allergies: No Known Allergies   (Not in a hospital admission)  Prior to Admission medications   Medication Sig Start Date End Date Taking?  Authorizing Provider  aspirin (ASPIR-81) 81 MG EC tablet Take 81 mg by mouth daily. Swallow whole.   Yes [provider]  Brinzolamide-Brimonidine (SIMBRINZA) 1-0.2 % SUSP Place 1 drop into both eyes 2 (two) times daily.    Yes [provider]  gabapentin (NEURONTIN) 600 MG tablet Take 1 tablet (600 mg total) by mouth 2 (two) times daily. 09/19/17  Yes Dena Billet B, PA-C  latanoprost (XALATAN) 0.005 % ophthalmic solution Place 1 drop into both eyes at bedtime.    Yes [provider]  meloxicam (MOBIC) 7.5 MG tablet Take 1 tablet (7.5 mg total) daily by mouth. 03/23/17  Yes Dixon, Mary B, PA-C  Netarsudil Dimesylate (RHOPRESSA) 0.02 % SOLN Place 1 drop into both eyes daily at 12 noon.    Yes [provider]  ONE DAILY MULTIPLE VITAMIN PO Take 1 tablet by mouth daily at 12 noon.    Yes [provider]  simvastatin (ZOCOR) 20 MG tablet Take 1 tablet (20 mg total) by mouth daily. 09/19/17  Yes Orlena Sheldon, PA-C  traZODone (DESYREL) 50 MG tablet Take 0.5-1 tablets (25-50 mg total) at bedtime as needed by mouth for sleep. 03/23/17  Yes Dixon, Mary B, PA-C    Blood pressure 136/72, pulse (!) 53, resp. rate 16, SpO2 96 %. Physical Exam: Physical Exam  Constitutional: He is oriented to person, place, and time and well-developed, well-nourished, and in no distress. No distress.  HENT:  Head: Normocephalic and atraumatic.  Right Ear: External ear normal.  Left Ear: External ear normal.  Mouth/Throat: Oropharynx is clear and moist.  Eyes: Pupils are equal, round, and reactive to light. EOM are normal. Right eye exhibits no discharge. Left eye exhibits no discharge. No scleral icterus.  Neck: Normal range of motion. No tracheal deviation present. No thyromegaly present.  Cardiovascular: Regular rhythm, normal heart sounds and intact distal pulses. Exam reveals no gallop and no friction rub.  No murmur heard. Pulmonary/Chest: Effort normal and breath sounds  normal. No respiratory distress. He has no wheezes. He has no rales. He exhibits no tenderness.  Abdominal: Soft. He exhibits distension. He exhibits no mass. There is tenderness. There is no rebound and no guarding.  Tinkering bowel sounds LUQ, mild TTP LUQ Diastasis recti present superior to umbilicus.  Musculoskeletal: Normal range of motion. He exhibits no edema or deformity.  Neurological: He is alert and oriented to person, place, and time. GCS score is 15.  Skin: Skin is warm and dry. No rash noted. He is not diaphoretic. No pallor.  Psychiatric: Mood, affect and judgment normal.   Results for orders placed or performed during the hospital encounter of 01/04/18 (from the past 48 hour(s))  Lipase, blood     Status: None   Collection Time: 01/04/18 10:54 AM  Result Value Ref Range   Lipase 25 11 - 51 U/L    Comment: Performed at [facility],  [address] [place], [place] [address]  Comprehensive metabolic panel     Status: Abnormal   Collection Time: 01/04/18 10:54 AM  Result Value Ref Range   Sodium 138 135 - 145 mmol/L   Potassium 3.8 3.5 - 5.1 mmol/L   Chloride 101 98 - 111 mmol/L   CO2 25 22 - 32 mmol/L   Glucose, Bld 108 (H) 70 - 99 mg/dL   BUN 14 8 - 23 mg/dL   Creatinine, Ser 1.02 0.61 - 1.24 mg/dL   Calcium 9.8 8.9 - 10.3 mg/dL   Total Protein 7.7 6.5 - 8.1 g/dL   Albumin 4.2 3.5 - 5.0 g/dL   AST 26 15 - 41 U/L   ALT 28 0 - 44 U/L   Alkaline Phosphatase 74 38 - 126 U/L   Total Bilirubin 0.8 0.3 - 1.2 mg/dL   GFR calc non Af Amer >60 >60 mL/min   GFR calc Af Amer >60 >60 mL/min    Comment: (NOTE) The eGFR has been calculated using the CKD EPI equation. This calculation has not been validated in all clinical situations. eGFR's persistently <60 mL/min signify possible Chronic Kidney Disease.    Anion gap 12 5 - 15    Comment: Performed at [facility] [address] [place], [place] [address]  CBC     Status: Abnormal   Collection Time: 01/04/18  10:54 AM  Result Value Ref Range   WBC 7.1 4.0 - 10.5 K/uL   RBC 5.68 4.22 - 5.81 MIL/uL   Hemoglobin 17.1 (H) 13.0 - 17.0 g/dL   HCT 53.1 (H) 39.0 - 52.0 %   MCV 93.5 78.0 - 100.0 fL   MCH 30.1 26.0 - 34.0 pg   MCHC 32.2 30.0 - 36.0 g/dL   RDW 13.3 11.5 - 15.5 %   Platelets 225 150 - 400 K/uL    Comment: Performed at [facility], [place] [address], [place], [place] [address]   Ct Abdomen Pelvis W Contrast  Addendum Date: 01/04/2018   ADDENDUM REPORT: 01/04/2018 14:41 ADDENDUM: The dilated small bowel is seen distal to the area of apparent wall thickening and irregularity of the proximal jejunum. The jejunum proximal to this is decompressed. This is opposite of what would typically be expected for a bowel obstruction, where the bowel proximal to the obstruction would be dilated. The more distal small bowel is decompressed without a discrete transition point. Findings still may reflect a partial mechanical obstruction more distally. Alternatively, findings could reflect a small bowel adynamic ileus. The abnormal appearance of the small bowel in the left mid abdomen, where there is a transition in small bowel caliber, remains concerning for inflammation or potentially neoplastic disease. This could be further assessed with a small bowel follow up exam. Electronically Signed   By: Lajean Manes M.D.   On: 01/04/2018 14:41   Result Date: 01/04/2018 CLINICAL DATA:  There has been a gi bug in the home involving vomiting and diarrhea. Dry heaves on sundayPatient has had a history of constipation and took dulcolax on Monday. Patient reports good results-large amount and loose.Generalized abdominal pain, diarrhea. Reports 5 watery stools today, minimal solid particulate to stool. Vomiting x 1 this morning. Patient is belching, but no relief. Current abdomen radiographs demonstrate a bowel gas pattern suggesting a small bowel obstruction. EXAM: CT ABDOMEN AND PELVIS WITH CONTRAST TECHNIQUE: Multidetector  CT imaging of the abdomen and pelvis was performed using the standard protocol following bolus administration of intravenous  contrast. CONTRAST:  167m ISOVUE-300 IOPAMIDOL (ISOVUE-300) INJECTION 61% COMPARISON:  Abdomen radiographs, 01/04/2018 FINDINGS: Lower chest: Subsegmental atelectasis.  No acute findings. Hepatobiliary: Decreased attenuation of the liver consistent with fatty infiltration. No liver mass or focal lesion. Normal gallbladder. No bile duct dilation. Pancreas: There is an oval masslike area along the distal pancreatic tail that measures 5 x 2.2 x 2.4 cm. This has the same attenuation as the normal spleen on the initial post-contrast and delayed sequences. This is most likely an accessory spleen abutting the pancreatic tail. Pancreas is otherwise unremarkable. Spleen: Spleen normal in overall size.  No splenic mass lesion. Adrenals/Urinary Tract: No adrenal masses. Kidneys normal size, orientation and position with symmetric enhancement and excretion. 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 No renal masses or stones.  No hydronephrosis. Normal ureters.  Normal bladder. Stomach/Bowel: There is distended small bowel with air-fluid levels consistent with a partial small bowel obstruction. There is a transition point between decompressed proximal jejunum and dilated jejunum and ileum. This transition point lies in the left mid abdomen. The bowel wall appears irregular dislocation. There is a more gradual decrease in small bowel caliber between the mid ileum to the distal ileum with the terminal ileum being decompressed. There is no other evidence of small bowel wall thickening or irregularity. No mesenteric inflammation. Stomach is unremarkable. Colon is normal in caliber. No wall thickening or inflammation. Normal appendix visualized. Vascular/Lymphatic: Aortic atherosclerosis. No enlarged abdominal or pelvic lymph nodes. Reproductive: Status post prostatectomy.  No pelvic  masses. Other: Trace amount ascites collects in the posterior pelvic recess. No abdominal wall hernia. Musculoskeletal: No fracture or acute finding. No osteoblastic or osteolytic lesions. There are significant lumbar spine disc and facet degenerative changes. IMPRESSION: 1. Partial small bowel obstruction. There is a transition point in the left mid abdomen. This may be due to adhesions. However, the wall appears irregularly thickened in this location. Cannot exclude inflammation or even neoplastic disease as the etiology. There is an associated trace amount of ascites. 2. There is a more gradual decrease in small bowel caliber along the ileum with the terminal ileum being decompressed. There is no additional discrete transition point. No other evidence of bowel wall thickening or irregularity. 3. Questionable pancreatic tail mass is most likely an accessory spleen abutting the pancreatic tail. This could be confirmed with MRI. 4. Hepatic steatosis. 5. Aortic atherosclerosis. Electronically Signed: By: DLajean ManesM.D. On: 01/04/2018 13:35   Dg Abd Acute W/chest  Result Date: 01/04/2018 CLINICAL DATA:  Onset of stomach pain 3 days ago. Recent constipation followed by nausea vomiting and diarrhea. EXAM: DG ABDOMEN ACUTE W/ 1V CHEST COMPARISON:  [facility]: The lungs are reasonably well inflated. There is minimal increased density at the left lung base laterally. The heart is top-normal in size. The pulmonary vascularity is normal. There is calcification in the wall of the aortic arch. Within the abdomen there are numerous loops of moderately distended gas and fluid-filled small bowel. No significant colonic distention is observed. There is stool and gas in the rectum. There are no abnormal soft tissue calcifications. There surgical clips in the pelvis. There is mild degenerative disc disease in the mid to lower lumbar spine. IMPRESSION: Findings compatible with a distal small bowel  obstruction. There is no evidence of perforation. Left basilar atelectasis or scarring. Mild enlargement of the cardiac silhouette without evidence of CHF. Thoracic aortic atherosclerosis. Electronically Signed   By: DShanon Brow  Martinique M.D.   On: 01/04/2018 09:49      Assessment/Plan H/o prostate cancer s/p complete prostatectomy 2015 HLD Glaucoma DDD   Abdominal pain  Nausea Diarrhea  ? PSBO vs enteritis vs other  - Given history of surgery, patient could have a PSBO 2/2 adhesive disease. Clinically picture is not straightforward for PSBO - constant abdominal pain associated with distention, poor appetite, one episode of vomiting. Though bowel function is abnormal/decreased, the patient is having some flatus and non-bloody diarrhea. I think this patient may benefit from small bowel protocol. Will also order GI panel to evaluate for possible enteritis.   ID - none VTE -SCD's, Lovenox FEN - NPO, IVF Foley - none Follow up - TBD

## 2018-01-04 NOTE — ED Triage Notes (Signed)
There has been a gi bug in the home involving vomiting and diarrhea.   Dry heaves on sunday Patient has had a history of constipation and took dulcolax on Monday.  Patient reports good results-large amount and loose.  Generalized abdominal pain, diarrhea.  Reports 5 watery stools today, minimal solid particulate to stool.  Vomiting x 1 this morning.  Patient is belching, but no relief

## 2018-01-04 NOTE — ED Triage Notes (Signed)
Pt in from urgent care with small bowel obstruction identified on xray, pt has been c/o lower abdominal pain since Sunday with n/v

## 2018-01-04 NOTE — ED Provider Notes (Signed)
[facility]   [id number] 01/04/18 Arrival Time: 0820  CC: ABDOMINAL DISCOMFORT  SUBJECTIVE:  Kyle Wilcox is a 68 y.o. male who presents with complaint of abdominal pain that began gradually 3 days ago.  Close contacts with similar symptoms.  Localizes pain to lower abdomen.  Describes as worsening, constant and sharp in character.  Pain is about a 8/10.  Has tried OTC medications mentadine, gas-x, and anti-acid without relief.  Mild improvement with lying down.  Denies aggravating factors.  Reports similar symptoms in the past.  Last BM today and watery with stool particles.  Complains chills, night sweats following vomiting, decreased appetite, nausea, vomiting x1 episode today with dry heaves 3 days ago, and approximately 10 episodes of watery diarrhea with stool particulates since onset of symptoms.  Denies fever, weight changes,  chest pain, SOB, constipation, hematochezia, melena, dysuria, difficulty urinating, increased frequency or urgency, flank pain, loss of bowel or bladder function.  No LMP for male patient.  ROS: As per HPI.  Past Medical History:  Diagnosis Date  . Allergy 2014  . Cancer [facility]) 2015   prostate cancer  . Cataract 2016  . DDD (degenerative disc disease), lumbar 12/24/2014  . Glaucoma   . Hx of colonic polyps 02/19/2011  . Hyperlipidemia 11/07/2014  . Peripheral neuropathy 12/24/2014   Past Surgical History:  Procedure Laterality Date  . COLONOSCOPY    . EYE SURGERY     laser surgery to relieve pressure on eyes- both eyes  . JOINT REPLACEMENT  2011   total shoulder replacement (Right)  . PROSTATE SURGERY     2015  . VASECTOMY  1982   No Known Allergies No current facility-administered medications on file prior to encounter.    Current Outpatient Medications on File Prior to Encounter  Medication Sig Dispense Refill  . latanoprost (XALATAN) 0.005 % ophthalmic solution 1 drop at bedtime.    Mckinley Jewel Dimesylate (RHOPRESSA) 0.02 % SOLN  Apply to eye.    Marland Kitchen aspirin (ASPIR-81) 81 MG EC tablet Take 81 mg by mouth daily. Swallow whole.    . Bimatoprost (LUMIGAN OP) Apply 5 mLs to eye 2 (two) times daily.    . brimonidine (ALPHAGAN) 0.2 % ophthalmic solution     . brinzolamide (AZOPT) 1 % ophthalmic suspension Place 1 drop into both eyes 2 (two) times daily.    . Brinzolamide-Brimonidine (SIMBRINZA) 1-0.2 % SUSP Apply to eye.    . gabapentin (NEURONTIN) 600 MG tablet Take 1 tablet (600 mg total) by mouth 2 (two) times daily. 180 tablet 3  . meloxicam (MOBIC) 7.5 MG tablet Take 1 tablet (7.5 mg total) daily by mouth. 90 tablet 3  . Methylcellulose, Laxative, (FIBER THERAPY PO) Take 0.52 mg by mouth 2 (two) times daily.    . ONE DAILY MULTIPLE VITAMIN PO Take by mouth.    . simvastatin (ZOCOR) 20 MG tablet Take 1 tablet (20 mg total) by mouth daily. 90 tablet 1  . traZODone (DESYREL) 50 MG tablet Take 0.5-1 tablets (25-50 mg total) at bedtime as needed by mouth for sleep. 90 tablet 1   Social History   Socioeconomic History  . Marital status: Married    Spouse name: Not on file  . Number of children: Not on file  . Years of education: Not on file  . Highest education level: Not on file  Occupational History  . Not on file  Social Needs  . Financial resource strain: Not on file  . Food insecurity:  Worry: Not on file    Inability: Not on file  . Transportation needs:    Medical: Not on file    Non-medical: Not on file  Tobacco Use  . Smoking status: Former Smoker    Last attempt to quit: 03/09/1981    Years since quitting: 36.8  . Smokeless tobacco: Never Used  Substance and Sexual Activity  . Alcohol use: Yes    Alcohol/week: 2.0 standard drinks    Types: 2 Cans of beer per week    Comment: occasionally  . Drug use: No  . Sexual activity: Not Currently  Lifestyle  . Physical activity:    Days per week: Not on file    Minutes per session: Not on file  . Stress: Not on file  Relationships  . Social  connections:    Talks on phone: Not on file    Gets together: Not on file    Attends religious service: Not on file    Active member of club or organization: Not on file    Attends meetings of clubs or organizations: Not on file    Relationship status: Not on file  . Intimate partner violence:    Fear of current or ex partner: Not on file    Emotionally abused: Not on file    Physically abused: Not on file    Forced sexual activity: Not on file  Other Topics Concern  . Not on file  Social History Narrative  . Not on file   Family History  Problem Relation Age of Onset  . Arthritis Mother   . Heart disease Mother   . Hyperlipidemia Mother   . Hypertension Mother   . Stroke Mother   . Vision loss Mother   . Arthritis Father   . Depression Father   . Heart disease Father   . Hyperlipidemia Father   . Stroke Father   . Arthritis Sister   . Cancer Sister   . Diabetes Sister   . Hyperlipidemia Sister   . Arthritis Brother   . Diabetes Brother   . Heart disease Brother   . Hyperlipidemia Brother   . Hypertension Brother   . Stomach cancer Maternal Grandfather   . Colon cancer Neg Hx   . Esophageal cancer Neg Hx   . Rectal cancer Neg Hx      OBJECTIVE:  Vitals:   01/04/18 0842  BP: (!) 160/77  Pulse: (!) 53  Resp: 16  Temp: 98.2 F (36.8 C)  TempSrc: Oral  SpO2: 100%    General appearance: Alert and oriented; appears uncomfortable HEENT: NCAT.  Oropharynx clear.  Lungs: clear to auscultation bilaterally without adventitious breath sounds Heart: regular rate and rhythm.  Radial pulses 2+ symmetrical bilaterally Abdomen: tense abdomen, distended; hyperactive bowel sounds; diffuse tenderness to palpatation; periumbilical hernia tender to palpation; negative rebound; minimal guarding Extremities: no edema; symmetrical with no gross deformities Skin: warm and dry Neurologic: normal gait Psychological: alert and cooperative; normal mood and affect  Imaging: Dg  Abd Acute W/chest  Result Date: 01/04/2018 CLINICAL DATA:  Onset of stomach pain 3 days ago. Recent constipation followed by nausea vomiting and diarrhea. EXAM: DG ABDOMEN ACUTE W/ 1V CHEST COMPARISON:  No recent studies in [facility] FINDINGS: The lungs are reasonably well inflated. There is minimal increased density at the left lung base laterally. The heart is top-normal in size. The pulmonary vascularity is normal. There is calcification in the wall of the aortic arch. Within the abdomen there are numerous  loops of moderately distended gas and fluid-filled small bowel. No significant colonic distention is observed. There is stool and gas in the rectum. There are no abnormal soft tissue calcifications. There surgical clips in the pelvis. There is mild degenerative disc disease in the mid to lower lumbar spine. IMPRESSION: Findings compatible with a distal small bowel obstruction. There is no evidence of perforation. Left basilar atelectasis or scarring. Mild enlargement of the cardiac silhouette without evidence of CHF. Thoracic aortic atherosclerosis. Electronically Signed   By: David  Martinique M.D.   On: 01/04/2018 09:49    ASSESSMENT & PLAN:  1. Generalized abdominal pain   2. Small bowel obstruction (HCC)     No orders of the defined types were placed in this encounter.  Recommend further evaluation and management at the ED Patient aware and in agreement with this plan  Reviewed expectations re: course of current medical issues. Questions answered. Outlined signs and symptoms indicating need for more acute intervention. Patient verbalized understanding. After Visit Summary given.   Lestine Box, PA-C 01/04/18 1008

## 2018-01-04 NOTE — ED Provider Notes (Signed)
[facility] EMERGENCY DEPARTMENT Provider Note   CSN: [id number] Arrival date & time: 01/04/18  1005     History   Chief Complaint Chief Complaint  Patient presents with  . Abdominal Pain    HPI Kyle Wilcox is a 67 y.o. male.  The history is provided by the patient, medical records and the spouse. No language interpreter was used.  Abdominal Pain   This is a new problem. The current episode started more than 2 days ago. The problem occurs constantly. The problem has not changed since onset.The pain is associated with an unknown factor. The pain is located in the generalized abdominal region. The quality of the pain is dull. The pain is at a severity of 8/10. The pain is severe. Associated symptoms include diarrhea, nausea, vomiting and constipation. Pertinent negatives include fever, dysuria and frequency. The symptoms are aggravated by palpation and eating. Nothing relieves the symptoms.    Past Medical History:  Diagnosis Date  . Allergy 2014  . Cancer [facility]) 2015   prostate cancer  . Cataract 2016  . DDD (degenerative disc disease), lumbar 12/24/2014  . Glaucoma   . Hx of colonic polyps 02/19/2011  . Hyperlipidemia 11/07/2014  . Peripheral neuropathy 12/24/2014    Patient Active Problem List   Diagnosis Date Noted  . Left shoulder pain 09/19/2017  . Chronic insomnia 10/18/2016  . Male impotence 12/24/2014  . DDD (degenerative disc disease), lumbar 12/24/2014  . Disorder of rotator cuff 12/24/2014  . Peripheral neuropathy 12/24/2014  . Glaucoma 11/07/2014  . Hyperlipidemia 11/07/2014  . History of prostate cancer 11/07/2014  . Hx of colonic polyps 02/19/2011    Past Surgical History:  Procedure Laterality Date  . COLONOSCOPY    . EYE SURGERY     laser surgery to relieve pressure on eyes- both eyes  . JOINT REPLACEMENT  2011   total shoulder replacement (Right)  . PROSTATE SURGERY     2015  . VASECTOMY  1982        Home Medications     Prior to Admission medications   Medication Sig Start Date End Date Taking? Authorizing Provider  aspirin (ASPIR-81) 81 MG EC tablet Take 81 mg by mouth daily. Swallow whole.    [provider]  Bimatoprost (LUMIGAN OP) Apply 5 mLs to eye 2 (two) times daily.    [provider]  brimonidine (ALPHAGAN) 0.2 % ophthalmic solution  11/19/15   [provider]  brinzolamide (AZOPT) 1 % ophthalmic suspension Place 1 drop into both eyes 2 (two) times daily.    [provider]  Brinzolamide-Brimonidine [facility]) 1-0.2 % SUSP Apply to eye.    [provider]  gabapentin (NEURONTIN) 600 MG tablet Take 1 tablet (600 mg total) by mouth 2 (two) times daily. 09/19/17   Dena Billet B, PA-C  latanoprost (XALATAN) 0.005 % ophthalmic solution 1 drop at bedtime.    [provider]  meloxicam (MOBIC) 7.5 MG tablet Take 1 tablet (7.5 mg total) daily by mouth. 03/23/17   Orlena Sheldon, PA-C  Methylcellulose, Laxative, (FIBER THERAPY PO) Take 0.52 mg by mouth 2 (two) times daily.    [provider]  Netarsudil Dimesylate (RHOPRESSA) 0.02 % SOLN Apply to eye.    [provider]  ONE DAILY MULTIPLE VITAMIN PO Take by mouth.    [provider]  simvastatin (ZOCOR) 20 MG tablet Take 1 tablet (20 mg total) by mouth daily. 09/19/17   Orlena Sheldon, PA-C  traZODone (DESYREL) 50 MG tablet Take 0.5-1 tablets (25-50 mg total) at bedtime as needed by mouth for sleep. 03/23/17   Orlena Sheldon, PA-C    Family History Family History  Problem Relation Age of Onset  . Arthritis Mother   . Heart disease Mother   . Hyperlipidemia Mother   . Hypertension Mother   . Stroke Mother   . Vision loss Mother   . Arthritis Father   . Depression Father   . Heart disease Father   . Hyperlipidemia Father   . Stroke Father   . Arthritis Sister   . Cancer Sister   . Diabetes Sister   . Hyperlipidemia Sister   . Arthritis Brother   . Diabetes Brother   .  Heart disease Brother   . Hyperlipidemia Brother   . Hypertension Brother   . Stomach cancer Maternal Grandfather   . Colon cancer Neg Hx   . Esophageal cancer Neg Hx   . Rectal cancer Neg Hx     Social History Social History   Tobacco Use  . Smoking status: Former Smoker    Last attempt to quit: 03/09/1981    Years since quitting: 36.8  . Smokeless tobacco: Never Used  Substance Use Topics  . Alcohol use: Yes    Alcohol/week: 2.0 standard drinks    Types: 2 Cans of beer per week    Comment: occasionally  . Drug use: No     Allergies   Patient has no known allergies.   Review of Systems Review of Systems  Constitutional: Negative for chills, diaphoresis, fatigue and fever.  HENT: Negative for congestion.   Respiratory: Negative for cough, chest tightness, shortness of breath, wheezing and stridor.   Cardiovascular: Negative for chest pain and palpitations.  Gastrointestinal: Positive for abdominal pain, constipation, diarrhea, nausea and vomiting.  Genitourinary: Negative for dysuria, flank pain and frequency.  Musculoskeletal: Negative for back pain, neck pain and neck stiffness.  Skin: Negative for rash and wound.  Psychiatric/Behavioral: Negative for agitation and confusion.  All other systems reviewed and are negative.    Physical Exam Updated Vital Signs There were no vitals taken for this visit.  Physical Exam  Constitutional: He appears well-developed and well-nourished.  Non-toxic appearance. He does not appear ill. No distress.  HENT:  Head: Normocephalic and atraumatic.  Eyes: Pupils are equal, round, and reactive to light. Conjunctivae are normal.  Neck: Neck supple.  Cardiovascular: Normal rate and regular rhythm.  No murmur heard. Pulmonary/Chest: Effort normal and breath sounds normal. No respiratory distress.  Abdominal: He exhibits distension. Bowel sounds are decreased. There is generalized tenderness. There is rigidity. There is no rebound,  no guarding and no CVA tenderness.  Musculoskeletal: He exhibits no edema.  Neurological: He is alert.  Skin: Skin is warm and dry. Capillary refill takes less than 2 seconds. No erythema. No pallor.  Psychiatric: He has a normal mood and affect.  Nursing note and vitals reviewed.    ED Treatments / Results  Labs (all labs ordered are listed, but only abnormal results are displayed) Labs Reviewed  COMPREHENSIVE METABOLIC PANEL - Abnormal; Notable for the following components:      Result Value   Glucose, Bld 108 (*)    All other components within normal limits  CBC - Abnormal; Notable for the following components:   Hemoglobin 17.1 (*)    HCT 53.1 (*)    All other components within normal limits  URINE CULTURE  LIPASE, BLOOD  URINALYSIS,  ROUTINE W REFLEX MICROSCOPIC  I-STAT CG4 LACTIC ACID, ED  I-STAT CG4 LACTIC ACID, ED    EKG None  Radiology Ct Abdomen Pelvis W Contrast  Addendum Date: 01/04/2018   ADDENDUM REPORT: 01/04/2018 14:41 ADDENDUM: The dilated small bowel is seen distal to the area of apparent wall thickening and irregularity of the proximal jejunum. The jejunum proximal to this is decompressed. This is opposite of what would typically be expected for a bowel obstruction, where the bowel proximal to the obstruction would be dilated. The more distal small bowel is decompressed without a discrete transition point. Findings still may reflect a partial mechanical obstruction more distally. Alternatively, findings could reflect a small bowel adynamic ileus. The abnormal appearance of the small bowel in the left mid abdomen, where there is a transition in small bowel caliber, remains concerning for inflammation or potentially neoplastic disease. This could be further assessed with a small bowel follow up exam. Electronically Signed   By: Lajean Manes M.D.   On: 01/04/2018 14:41   Result Date: 01/04/2018 CLINICAL DATA:  There has been a gi bug in the home involving vomiting  and diarrhea. Dry heaves on sundayPatient has had a history of constipation and took dulcolax on Monday. Patient reports good results-large amount and loose.Generalized abdominal pain, diarrhea. Reports 5 watery stools today, minimal solid particulate to stool. Vomiting x 1 this morning. Patient is belching, but no relief. Current abdomen radiographs demonstrate a bowel gas pattern suggesting a small bowel obstruction. EXAM: CT ABDOMEN AND PELVIS WITH CONTRAST TECHNIQUE: Multidetector CT imaging of the abdomen and pelvis was performed using the standard protocol following bolus administration of intravenous contrast. CONTRAST:  128mL ISOVUE-300 IOPAMIDOL (ISOVUE-300) INJECTION 61% COMPARISON:  Abdomen radiographs, 01/04/2018 FINDINGS: Lower chest: Subsegmental atelectasis.  No acute findings. Hepatobiliary: Decreased attenuation of the liver consistent with fatty infiltration. No liver mass or focal lesion. Normal gallbladder. No bile duct dilation. Pancreas: There is an oval masslike area along the distal pancreatic tail that measures 5 x 2.2 x 2.4 cm. This has the same attenuation as the normal spleen on the initial post-contrast and delayed sequences. This is most likely an accessory spleen abutting the pancreatic tail. Pancreas is otherwise unremarkable. Spleen: Spleen normal in overall size.  No splenic mass lesion. Adrenals/Urinary Tract: No adrenal masses. Kidneys normal size, orientation and position with symmetric enhancement and excretion. 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 No renal masses or stones.  No hydronephrosis. Normal ureters.  Normal bladder. Stomach/Bowel: There is distended small bowel with air-fluid levels consistent with a partial small bowel obstruction. There is a transition point between decompressed proximal jejunum and dilated jejunum and ileum. This transition point lies in the left mid abdomen. The bowel wall appears irregular dislocation. There is a more  gradual decrease in small bowel caliber between the mid ileum to the distal ileum with the terminal ileum being decompressed. There is no other evidence of small bowel wall thickening or irregularity. No mesenteric inflammation. Stomach is unremarkable. Colon is normal in caliber. No wall thickening or inflammation. Normal appendix visualized. Vascular/Lymphatic: Aortic atherosclerosis. No enlarged abdominal or pelvic lymph nodes. Reproductive: Status post prostatectomy.  No pelvic masses. Other: Trace amount ascites collects in the posterior pelvic recess. No abdominal wall hernia. Musculoskeletal: No fracture or acute finding. No osteoblastic or osteolytic lesions. There are significant lumbar spine disc and facet degenerative changes. IMPRESSION: 1. Partial small bowel obstruction. There is a transition point in the left mid  abdomen. This may be due to adhesions. However, the wall appears irregularly thickened in this location. Cannot exclude inflammation or even neoplastic disease as the etiology. There is an associated trace amount of ascites. 2. There is a more gradual decrease in small bowel caliber along the ileum with the terminal ileum being decompressed. There is no additional discrete transition point. No other evidence of bowel wall thickening or irregularity. 3. Questionable pancreatic tail mass is most likely an accessory spleen abutting the pancreatic tail. This could be confirmed with MRI. 4. Hepatic steatosis. 5. Aortic atherosclerosis. Electronically Signed: By: Lajean Manes M.D. On: 01/04/2018 13:35   Dg Abd Acute W/chest  Result Date: 01/04/2018 CLINICAL DATA:  Onset of stomach pain 3 days ago. Recent constipation followed by nausea vomiting and diarrhea. EXAM: DG ABDOMEN ACUTE W/ 1V CHEST COMPARISON:  No recent studies in [facility] FINDINGS: The lungs are reasonably well inflated. There is minimal increased density at the left lung base laterally. The heart is top-normal in size. The pulmonary  vascularity is normal. There is calcification in the wall of the aortic arch. Within the abdomen there are numerous loops of moderately distended gas and fluid-filled small bowel. No significant colonic distention is observed. There is stool and gas in the rectum. There are no abnormal soft tissue calcifications. There surgical clips in the pelvis. There is mild degenerative disc disease in the mid to lower lumbar spine. IMPRESSION: Findings compatible with a distal small bowel obstruction. There is no evidence of perforation. Left basilar atelectasis or scarring. Mild enlargement of the cardiac silhouette without evidence of CHF. Thoracic aortic atherosclerosis. Electronically Signed   By: David  Martinique M.D.   On: 01/04/2018 09:49    Procedures Procedures (including critical care time)  Medications Ordered in ED Medications  iopamidol (ISOVUE-300) 61 % injection (has no administration in time range)  morphine 4 MG/ML injection 4 mg (4 mg Intravenous Given 01/04/18 1257)  sodium chloride 0.9 % bolus 1,000 mL (0 mLs Intravenous Stopped 01/04/18 1407)  ondansetron (ZOFRAN) injection 4 mg (4 mg Intravenous Given 01/04/18 1257)  iopamidol (ISOVUE-300) 61 % injection 100 mL (100 mLs Intravenous Contrast Given 01/04/18 1309)     Initial Impression / Assessment and Plan / ED Course  I have reviewed the triage vital signs and the nursing notes.  Pertinent labs & imaging results that were available during my care of the patient were reviewed by me and considered in my medical decision making (see chart for details).     Kyle Wilcox is a 67 y.o. male with a past medical history significant for prostate cancer, hyperlipidemia, and glaucoma who presents from urgent care with concern for small bowel obstruction with associated abdominal pain, distention, nausea, and vomiting.  Patient reports that for the last 4 days he has had abdominal symptoms.  He says that starting Saturday he began having abdominal  discomfort.  He has had several episodes of diarrhea and is still passing flatus but says his abdomen is gotten more more distended.  He reports he had several episodes of nausea and vomiting and has had decreased oral intake.  He reports the pain gets up to an 8 out of 10 severity and is constant.  It is all over his abdomen.  He denies fevers, chills, urinary symptoms, cough, congestion, or chest pain.  He denies any history of small bowel obstruction.  He denies recent trauma.  Next  On exam, abdomen is extremely distended.  Bowel sounds are decreased.  Abdomen  is tender diffusely.  Lungs are clear and chest is nontender.  Patient has symmetric pulses in upper extremities and lower extremity's.  Patient appears well.  Next  Given urgent care's report of small bowel obstruction concern on x-ray, patient was CT scan to confirm this.  He will have laboratory testing and also be given pain medicine, nausea medicine, fluids, and be made n.p.o.    Anticipate reassessment after work-up.  1:43 PM CT scan confirms a partial small bowel obstruction with a transition point in the left lower quadrant.  General surgery will be called.  3:33 PM General surgery is going to discuss with her team if they want to admit him versus admit to medicine for partial obstruction.  Awaiting general surgery recommendations.   Final Clinical Impressions(s) / ED Diagnoses   Final diagnoses:  Generalized abdominal pain  Small bowel obstruction [facility])    ED Discharge Orders    None      Clinical Impression: 1. Generalized abdominal pain   2. Small bowel obstruction ([place])     Disposition: Admit  This note was prepared with assistance of Dragon voice recognition software. Occasional wrong-word or sound-a-like substitutions may have occurred due to the inherent limitations of voice recognition software.     Tegeler, Gwenyth Allegra, MD 01/04/18 [phone]

## 2018-01-05 ENCOUNTER — Observation Stay (HOSPITAL_COMMUNITY): Payer: Medicare Other

## 2018-01-05 DIAGNOSIS — Z86010 Personal history of colonic polyps: Secondary | ICD-10-CM | POA: Diagnosis not present

## 2018-01-05 DIAGNOSIS — E785 Hyperlipidemia, unspecified: Secondary | ICD-10-CM | POA: Diagnosis not present

## 2018-01-05 DIAGNOSIS — K5651 Intestinal adhesions [bands], with partial obstruction: Secondary | ICD-10-CM | POA: Diagnosis not present

## 2018-01-05 DIAGNOSIS — Z96611 Presence of right artificial shoulder joint: Secondary | ICD-10-CM | POA: Diagnosis not present

## 2018-01-05 DIAGNOSIS — H409 Unspecified glaucoma: Secondary | ICD-10-CM | POA: Diagnosis not present

## 2018-01-05 DIAGNOSIS — K559 Vascular disorder of intestine, unspecified: Secondary | ICD-10-CM | POA: Diagnosis not present

## 2018-01-05 DIAGNOSIS — Z9852 Vasectomy status: Secondary | ICD-10-CM | POA: Diagnosis not present

## 2018-01-05 DIAGNOSIS — Z818 Family history of other mental and behavioral disorders: Secondary | ICD-10-CM | POA: Diagnosis not present

## 2018-01-05 DIAGNOSIS — K567 Ileus, unspecified: Secondary | ICD-10-CM | POA: Diagnosis not present

## 2018-01-05 DIAGNOSIS — K565 Intestinal adhesions [bands], unspecified as to partial versus complete obstruction: Secondary | ICD-10-CM | POA: Diagnosis not present

## 2018-01-05 DIAGNOSIS — Z800 Family history of malignant neoplasm of digestive organs: Secondary | ICD-10-CM | POA: Diagnosis not present

## 2018-01-05 DIAGNOSIS — M5136 Other intervertebral disc degeneration, lumbar region: Secondary | ICD-10-CM | POA: Diagnosis present

## 2018-01-05 DIAGNOSIS — Z8349 Family history of other endocrine, nutritional and metabolic diseases: Secondary | ICD-10-CM | POA: Diagnosis not present

## 2018-01-05 DIAGNOSIS — Z7982 Long term (current) use of aspirin: Secondary | ICD-10-CM | POA: Diagnosis not present

## 2018-01-05 DIAGNOSIS — Z8261 Family history of arthritis: Secondary | ICD-10-CM | POA: Diagnosis not present

## 2018-01-05 DIAGNOSIS — Z8546 Personal history of malignant neoplasm of prostate: Secondary | ICD-10-CM | POA: Diagnosis not present

## 2018-01-05 DIAGNOSIS — Z821 Family history of blindness and visual loss: Secondary | ICD-10-CM | POA: Diagnosis not present

## 2018-01-05 DIAGNOSIS — Z823 Family history of stroke: Secondary | ICD-10-CM | POA: Diagnosis not present

## 2018-01-05 DIAGNOSIS — K56609 Unspecified intestinal obstruction, unspecified as to partial versus complete obstruction: Secondary | ICD-10-CM | POA: Diagnosis not present

## 2018-01-05 DIAGNOSIS — Z87891 Personal history of nicotine dependence: Secondary | ICD-10-CM | POA: Diagnosis not present

## 2018-01-05 DIAGNOSIS — K56600 Partial intestinal obstruction, unspecified as to cause: Secondary | ICD-10-CM | POA: Diagnosis not present

## 2018-01-05 DIAGNOSIS — Z79899 Other long term (current) drug therapy: Secondary | ICD-10-CM | POA: Diagnosis not present

## 2018-01-05 DIAGNOSIS — G629 Polyneuropathy, unspecified: Secondary | ICD-10-CM | POA: Diagnosis present

## 2018-01-05 DIAGNOSIS — D62 Acute posthemorrhagic anemia: Secondary | ICD-10-CM | POA: Diagnosis not present

## 2018-01-05 DIAGNOSIS — M549 Dorsalgia, unspecified: Secondary | ICD-10-CM | POA: Diagnosis not present

## 2018-01-05 DIAGNOSIS — Z8249 Family history of ischemic heart disease and other diseases of the circulatory system: Secondary | ICD-10-CM | POA: Diagnosis not present

## 2018-01-05 DIAGNOSIS — Z833 Family history of diabetes mellitus: Secondary | ICD-10-CM | POA: Diagnosis not present

## 2018-01-05 LAB — CBC
HEMATOCRIT: 49.5 % (ref 39.0–52.0)
Hemoglobin: 16.4 g/dL (ref 13.0–17.0)
MCH: 30.4 pg (ref 26.0–34.0)
MCHC: 33.1 g/dL (ref 30.0–36.0)
MCV: 91.7 fL (ref 78.0–100.0)
Platelets: 224 10*3/uL (ref 150–400)
RBC: 5.4 MIL/uL (ref 4.22–5.81)
RDW: 13.4 % (ref 11.5–15.5)
WBC: 6.2 10*3/uL (ref 4.0–10.5)

## 2018-01-05 LAB — BASIC METABOLIC PANEL
Anion gap: 8 (ref 5–15)
BUN: 9 mg/dL (ref 8–23)
CHLORIDE: 101 mmol/L (ref 98–111)
CO2: 30 mmol/L (ref 22–32)
Calcium: 9.3 mg/dL (ref 8.9–10.3)
Creatinine, Ser: 0.91 mg/dL (ref 0.61–1.24)
GFR calc Af Amer: >60 mL/min (ref >60)
GFR calc non Af Amer: >60 mL/min (ref >60)
GLUCOSE: 119 mg/dL — AB (ref 70–99)
POTASSIUM: 4.1 mmol/L (ref 3.5–5.1)
SODIUM: 139 mmol/L (ref 135–145)

## 2018-01-05 LAB — URINE CULTURE: CULTURE: NO GROWTH

## 2018-01-05 LAB — HIV ANTIBODY (ROUTINE TESTING W REFLEX): HIV Screen 4th Generation wRfx: NONREACTIVE

## 2018-01-05 NOTE — Progress Notes (Signed)
Patient ID: Kyle Wilcox, male   DOB: [birth year], 67 y.o.   MRN: [id number]       Subjective: States he feels better today, but no BM or flatus.  Feels about the same in regards to distention.  Objective: Vital signs in last 24 hours: Temp:  [98.4 F (36.9 C)-99.1 F (37.3 C)] 98.6 F (37 C) (08/22 0741) Pulse Rate:  [50-61] 58 (08/22 0741) Resp:  [13-18] 18 (08/22 0741) BP: (121-188)/(72-93) 127/84 (08/22 0741) SpO2:  [95 %-98 %] 95 % (08/22 0741)    Intake/Output from previous day: 08/21 0701 - 08/22 0700 In: -  Out: 800 [Urine:400; Emesis/NG output:400] Intake/Output this shift: Total I/O In: -  Out: 100 [Urine:100]  PE: Heart: regular Lungs: CTAB Abd: soft, but certainly distended, few BS, NGT with some bilious output, although not much recent as his stomach appears fairly decompressed.  This was flushed to assure it was working correctly.  Lab Results:  Recent Labs    01/04/18 1054 01/05/18 0643  WBC 7.1 6.2  HGB 17.1* 16.4  HCT 53.1* 49.5  PLT 225 224   BMET Recent Labs    01/04/18 1054 01/05/18 0643  NA 138 139  K 3.8 4.1  CL 101 101  CO2 25 30  GLUCOSE 108* 119*  BUN 14 9  CREATININE 1.02 0.91  CALCIUM 9.8 9.3   PT/INR No results for input(s): LABPROT, INR in the last 72 hours. CMP     Component Value Date/Time   NA 139 01/05/2018 0643   K 4.1 01/05/2018 0643   CL 101 01/05/2018 0643   CO2 30 01/05/2018 0643   GLUCOSE 119 (H) 01/05/2018 0643   BUN 9 01/05/2018 0643   CREATININE 0.91 01/05/2018 0643   CREATININE 0.96 09/19/2017 1046   CALCIUM 9.3 01/05/2018 0643   PROT 7.7 01/04/2018 1054   ALBUMIN 4.2 01/04/2018 1054   AST 26 01/04/2018 1054   ALT 28 01/04/2018 1054   ALKPHOS 74 01/04/2018 1054   BILITOT 0.8 01/04/2018 1054   GFRNONAA >60 01/05/2018 0643   GFRNONAA 81 09/19/2017 1046   GFRAA >60 01/05/2018 0643   GFRAA 94 09/19/2017 1046   Lipase     Component Value Date/Time   LIPASE 25 01/04/2018 1054        Studies/Results: Ct Abdomen Pelvis W Contrast  Addendum Date: 01/04/2018   ADDENDUM REPORT: 01/04/2018 14:41 ADDENDUM: The dilated small bowel is seen distal to the area of apparent wall thickening and irregularity of the proximal jejunum. The jejunum proximal to this is decompressed. This is opposite of what would typically be expected for a bowel obstruction, where the bowel proximal to the obstruction would be dilated. The more distal small bowel is decompressed without a discrete transition point. Findings still may reflect a partial mechanical obstruction more distally. Alternatively, findings could reflect a small bowel adynamic ileus. The abnormal appearance of the small bowel in the left mid abdomen, where there is a transition in small bowel caliber, remains concerning for inflammation or potentially neoplastic disease. This could be further assessed with a small bowel follow up exam. Electronically Signed   By: Lajean Manes M.D.   On: 01/04/2018 14:41   Result Date: 01/04/2018 CLINICAL DATA:  There has been a gi bug in the home involving vomiting and diarrhea. Dry heaves on sundayPatient has had a history of constipation and took dulcolax on Monday. Patient reports good results-large amount and loose.Generalized abdominal pain, diarrhea. Reports 5 watery stools today, minimal solid particulate  to stool. Vomiting x 1 this morning. Patient is belching, but no relief. Current abdomen radiographs demonstrate a bowel gas pattern suggesting a small bowel obstruction. EXAM: CT ABDOMEN AND PELVIS WITH CONTRAST TECHNIQUE: Multidetector CT imaging of the abdomen and pelvis was performed using the standard protocol following bolus administration of intravenous contrast. CONTRAST:  146mL ISOVUE-300 IOPAMIDOL (ISOVUE-300) INJECTION 61% COMPARISON:  Abdomen radiographs, 01/04/2018 FINDINGS: Lower chest: Subsegmental atelectasis.  No acute findings. Hepatobiliary: Decreased attenuation of the liver  consistent with fatty infiltration. No liver mass or focal lesion. Normal gallbladder. No bile duct dilation. Pancreas: There is an oval masslike area along the distal pancreatic tail that measures 5 x 2.2 x 2.4 cm. This has the same attenuation as the normal spleen on the initial post-contrast and delayed sequences. This is most likely an accessory spleen abutting the pancreatic tail. Pancreas is otherwise unremarkable. Spleen: Spleen normal in overall size.  No splenic mass lesion. Adrenals/Urinary Tract: No adrenal masses. Kidneys normal size, orientation and position with symmetric enhancement and excretion. 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 No renal masses or stones.  No hydronephrosis. Normal ureters.  Normal bladder. Stomach/Bowel: There is distended small bowel with air-fluid levels consistent with a partial small bowel obstruction. There is a transition point between decompressed proximal jejunum and dilated jejunum and ileum. This transition point lies in the left mid abdomen. The bowel wall appears irregular dislocation. There is a more gradual decrease in small bowel caliber between the mid ileum to the distal ileum with the terminal ileum being decompressed. There is no other evidence of small bowel wall thickening or irregularity. No mesenteric inflammation. Stomach is unremarkable. Colon is normal in caliber. No wall thickening or inflammation. Normal appendix visualized. Vascular/Lymphatic: Aortic atherosclerosis. No enlarged abdominal or pelvic lymph nodes. Reproductive: Status post prostatectomy.  No pelvic masses. Other: Trace amount ascites collects in the posterior pelvic recess. No abdominal wall hernia. Musculoskeletal: No fracture or acute finding. No osteoblastic or osteolytic lesions. There are significant lumbar spine disc and facet degenerative changes. IMPRESSION: 1. Partial small bowel obstruction. There is a transition point in the left mid abdomen. This  may be due to adhesions. However, the wall appears irregularly thickened in this location. Cannot exclude inflammation or even neoplastic disease as the etiology. There is an associated trace amount of ascites. 2. There is a more gradual decrease in small bowel caliber along the ileum with the terminal ileum being decompressed. There is no additional discrete transition point. No other evidence of bowel wall thickening or irregularity. 3. Questionable pancreatic tail mass is most likely an accessory spleen abutting the pancreatic tail. This could be confirmed with MRI. 4. Hepatic steatosis. 5. Aortic atherosclerosis. Electronically Signed: By: Lajean Manes M.D. On: 01/04/2018 13:35   Dg Abd Acute W/chest  Result Date: 01/04/2018 CLINICAL DATA:  Onset of stomach pain 3 days ago. Recent constipation followed by nausea vomiting and diarrhea. EXAM: DG ABDOMEN ACUTE W/ 1V CHEST COMPARISON:  No recent studies in [facility] FINDINGS: The lungs are reasonably well inflated. There is minimal increased density at the left lung base laterally. The heart is top-normal in size. The pulmonary vascularity is normal. There is calcification in the wall of the aortic arch. Within the abdomen there are numerous loops of moderately distended gas and fluid-filled small bowel. No significant colonic distention is observed. There is stool and gas in the rectum. There are no abnormal soft tissue calcifications. There surgical clips in the  pelvis. There is mild degenerative disc disease in the mid to lower lumbar spine. IMPRESSION: Findings compatible with a distal small bowel obstruction. There is no evidence of perforation. Left basilar atelectasis or scarring. Mild enlargement of the cardiac silhouette without evidence of CHF. Thoracic aortic atherosclerosis. Electronically Signed   By: David  Martinique M.D.   On: 01/04/2018 09:49   Dg Abd Portable 1v-small Bowel Obstruction Protocol-initial, 8 Hr Delay  Result Date: 01/05/2018 CLINICAL  DATA:  Small-bowel obstruction EXAM: PORTABLE ABDOMEN - 1 VIEW COMPARISON:  01/04/2018 FINDINGS: NG tube in the stomach. Small-bowel loops are more dilated on the current study. Colon decompressed. Small amount of gas in the rectum. IMPRESSION: Progressive small bowel dilatation compatible with worsening small bowel obstruction. Electronically Signed   By: Franchot Gallo M.D.   On: 01/05/2018 07:38   Dg Abd Portable 1v-small Bowel Protocol-position Verification  Result Date: 01/04/2018 CLINICAL DATA:  Gastric tube placement EXAM: PORTABLE ABDOMEN - 1 VIEW COMPARISON:  01/04/2018 CT FINDINGS: Gastric tube with side port extend into the distal half of the stomach. Dilated small bowel loops up to 4 cm in caliber consistent with known partial or early SBO is demonstrated. No free air. The included heart size is enlarged. There is aortic atherosclerosis without aneurysm. Right shoulder arthroplasty is partially included. IMPRESSION: Gastric tube extends into the stomach. Dilated small bowel loops are identified consistent with known partial or early SBO. Electronically Signed   By: Ashley Royalty M.D.   On: 01/04/2018 17:03    Anti-infectives: Anti-infectives (From admission, onward)   None       Assessment/Plan  SBO  -history of open prostatectomy. -SBO pattern appears about stable to slightly worse than yesterday's films. -recheck films in the am.  If his obstructive pattern persists, then we will need to consider surgical intervention.  FEN - NPO/NGT/IVFs VTE - SCDs/Lovenox ID - none   LOS: 0 days    Henreitta Cea , [facility] Surgery 01/05/2018, 9:25 AM Pager: [phone]

## 2018-01-06 ENCOUNTER — Inpatient Hospital Stay (HOSPITAL_COMMUNITY): Payer: Medicare Other | Admitting: Anesthesiology

## 2018-01-06 ENCOUNTER — Inpatient Hospital Stay (HOSPITAL_COMMUNITY): Payer: Medicare Other

## 2018-01-06 ENCOUNTER — Encounter (HOSPITAL_COMMUNITY): Admission: EM | Disposition: A | Discharge status: Home / Self Care | Payer: Self-pay | Source: Home / Self Care

## 2018-01-06 ENCOUNTER — Encounter (HOSPITAL_COMMUNITY): Payer: Self-pay | Admitting: *Deleted

## 2018-01-06 HISTORY — PX: LYSIS OF ADHESION: SHX5961

## 2018-01-06 HISTORY — PX: LAPAROTOMY: SHX154

## 2018-01-06 LAB — CBC WITH DIFFERENTIAL/PLATELET
ABS IMMATURE GRANULOCYTES: 0 10*3/uL (ref 0.0–0.1)
BASOS ABS: 0 10*3/uL (ref 0.0–0.1)
Basophils Relative: 0 %
EOS PCT: 1 %
Eosinophils Absolute: 0.1 10*3/uL (ref 0.0–0.7)
HEMATOCRIT: 48.3 % (ref 39.0–52.0)
HEMOGLOBIN: 16.3 g/dL (ref 13.0–17.0)
IMMATURE GRANULOCYTES: 0 %
LYMPHS ABS: 1.1 10*3/uL (ref 0.7–4.0)
LYMPHS PCT: 16 %
MCH: 30.8 pg (ref 26.0–34.0)
MCHC: 33.7 g/dL (ref 30.0–36.0)
MCV: 91.1 fL (ref 78.0–100.0)
Monocytes Absolute: 1.5 10*3/uL — ABNORMAL HIGH (ref 0.1–1.0)
Monocytes Relative: 21 %
NEUTROS ABS: 4.3 10*3/uL (ref 1.7–7.7)
NEUTROS PCT: 62 %
Platelets: 246 10*3/uL (ref 150–400)
RBC: 5.3 MIL/uL (ref 4.22–5.81)
RDW: 13.2 % (ref 11.5–15.5)
WBC: 7 10*3/uL (ref 4.0–10.5)

## 2018-01-06 LAB — BASIC METABOLIC PANEL
ANION GAP: 7 (ref 5–15)
BUN: 9 mg/dL (ref 8–23)
CO2: 31 mmol/L (ref 22–32)
CREATININE: 0.9 mg/dL (ref 0.61–1.24)
Calcium: 9.6 mg/dL (ref 8.9–10.3)
Chloride: 99 mmol/L (ref 98–111)
GFR calc non Af Amer: >60 mL/min (ref >60)
Glucose, Bld: 113 mg/dL — ABNORMAL HIGH (ref 70–99)
POTASSIUM: 4.3 mmol/L (ref 3.5–5.1)
SODIUM: 137 mmol/L (ref 135–145)

## 2018-01-06 LAB — TYPE AND SCREEN
ABO/RH(D): A POS
ANTIBODY SCREEN: NEGATIVE

## 2018-01-06 LAB — ABO/RH: ABO/RH(D): A POS

## 2018-01-06 LAB — MRSA PCR SCREENING: MRSA BY PCR: NEGATIVE

## 2018-01-06 SURGERY — LAPAROTOMY, EXPLORATORY
Anesthesia: General | Site: Abdomen

## 2018-01-06 MED ORDER — MIDAZOLAM HCL 5 MG/5ML IJ SOLN
INTRAMUSCULAR | Status: DC | PRN
  Administered 2018-01-06: 2 mg via INTRAVENOUS

## 2018-01-06 MED ORDER — FENTANYL CITRATE (PF) 250 MCG/5ML IJ SOLN
INTRAMUSCULAR | Status: DC | PRN
  Administered 2018-01-06: 50 ug via INTRAVENOUS
  Administered 2018-01-06: 150 ug via INTRAVENOUS
  Administered 2018-01-06: 50 ug via INTRAVENOUS
  Administered 2018-01-06: 100 ug via INTRAVENOUS

## 2018-01-06 MED ORDER — PROPOFOL 10 MG/ML IV BOLUS
INTRAVENOUS | Status: DC | PRN
  Administered 2018-01-06: 150 mg via INTRAVENOUS

## 2018-01-06 MED ORDER — 0.9 % SODIUM CHLORIDE (POUR BTL) OPTIME
TOPICAL | Status: DC | PRN
  Administered 2018-01-06 (×2): 1000 mL

## 2018-01-06 MED ORDER — LACTATED RINGERS IV SOLN
INTRAVENOUS | Status: DC
  Administered 2018-01-06 (×2): via INTRAVENOUS

## 2018-01-06 MED ORDER — FENTANYL CITRATE (PF) 250 MCG/5ML IJ SOLN
INTRAMUSCULAR | Status: AC
  Filled 2018-01-06: qty 5

## 2018-01-06 MED ORDER — HYDRALAZINE HCL 20 MG/ML IJ SOLN
INTRAMUSCULAR | Status: AC
  Filled 2018-01-06: qty 1

## 2018-01-06 MED ORDER — CEFOTETAN DISODIUM-DEXTROSE 2-2.08 GM-%(50ML) IV SOLR
INTRAVENOUS | Status: AC
  Filled 2018-01-06: qty 50

## 2018-01-06 MED ORDER — PROPOFOL 10 MG/ML IV BOLUS
INTRAVENOUS | Status: AC
  Filled 2018-01-06: qty 20

## 2018-01-06 MED ORDER — DEXAMETHASONE SODIUM PHOSPHATE 10 MG/ML IJ SOLN
INTRAMUSCULAR | Status: DC | PRN
  Administered 2018-01-06: 10 mg via INTRAVENOUS

## 2018-01-06 MED ORDER — SODIUM CHLORIDE 0.9 % IV SOLN
500.0000 mL | Freq: Once | INTRAVENOUS | Status: AC
  Administered 2018-01-06: 500 mL via INTRAVENOUS

## 2018-01-06 MED ORDER — ONDANSETRON HCL 4 MG/2ML IJ SOLN
INTRAMUSCULAR | Status: AC
  Filled 2018-01-06: qty 2

## 2018-01-06 MED ORDER — LIDOCAINE 2% (20 MG/ML) 5 ML SYRINGE
INTRAMUSCULAR | Status: AC
  Filled 2018-01-06: qty 5

## 2018-01-06 MED ORDER — LACTATED RINGERS IV SOLN: INTRAVENOUS | Status: DC

## 2018-01-06 MED ORDER — LIDOCAINE 2% (20 MG/ML) 5 ML SYRINGE
INTRAMUSCULAR | Status: DC | PRN
  Administered 2018-01-06 (×2): 50 mg via INTRAVENOUS

## 2018-01-06 MED ORDER — ROCURONIUM BROMIDE 10 MG/ML (PF) SYRINGE
PREFILLED_SYRINGE | INTRAVENOUS | Status: DC | PRN
  Administered 2018-01-06: 25 mg via INTRAVENOUS
  Administered 2018-01-06: 50 mg via INTRAVENOUS

## 2018-01-06 MED ORDER — ROCURONIUM BROMIDE 50 MG/5ML IV SOSY
PREFILLED_SYRINGE | INTRAVENOUS | Status: AC
  Filled 2018-01-06: qty 10

## 2018-01-06 MED ORDER — MIDAZOLAM HCL 2 MG/2ML IJ SOLN
INTRAMUSCULAR | Status: AC
  Filled 2018-01-06: qty 2

## 2018-01-06 MED ORDER — SODIUM CHLORIDE 0.9 % IV SOLN
2.0000 g | INTRAVENOUS | Status: AC
  Administered 2018-01-06: 2 g via INTRAVENOUS
  Filled 2018-01-06: qty 2

## 2018-01-06 MED ORDER — PHENYLEPHRINE 40 MCG/ML (10ML) SYRINGE FOR IV PUSH (FOR BLOOD PRESSURE SUPPORT)
PREFILLED_SYRINGE | INTRAVENOUS | Status: AC
  Filled 2018-01-06: qty 10

## 2018-01-06 MED ORDER — HYDROMORPHONE HCL 1 MG/ML IJ SOLN
INTRAMUSCULAR | Status: AC
  Filled 2018-01-06: qty 1

## 2018-01-06 MED ORDER — LABETALOL HCL 5 MG/ML IV SOLN
INTRAVENOUS | Status: DC | PRN
  Administered 2018-01-06: 10 mg via INTRAVENOUS

## 2018-01-06 MED ORDER — POVIDONE-IODINE 10 % EX OINT
TOPICAL_OINTMENT | CUTANEOUS | Status: AC
  Filled 2018-01-06: qty 28.35

## 2018-01-06 MED ORDER — SUCCINYLCHOLINE CHLORIDE 20 MG/ML IJ SOLN
INTRAMUSCULAR | Status: DC | PRN
  Administered 2018-01-06: 200 mg via INTRAVENOUS

## 2018-01-06 MED ORDER — SUCCINYLCHOLINE CHLORIDE 200 MG/10ML IV SOSY
PREFILLED_SYRINGE | INTRAVENOUS | Status: AC
  Filled 2018-01-06: qty 10

## 2018-01-06 MED ORDER — PHENYLEPHRINE 40 MCG/ML (10ML) SYRINGE FOR IV PUSH (FOR BLOOD PRESSURE SUPPORT)
PREFILLED_SYRINGE | INTRAVENOUS | Status: DC | PRN
  Administered 2018-01-06: 60 ug via INTRAVENOUS

## 2018-01-06 MED ORDER — DEXAMETHASONE SODIUM PHOSPHATE 10 MG/ML IJ SOLN
INTRAMUSCULAR | Status: AC
  Filled 2018-01-06: qty 1

## 2018-01-06 MED ORDER — HYDROMORPHONE HCL 1 MG/ML IJ SOLN
0.2500 mg | INTRAMUSCULAR | Status: DC | PRN
  Administered 2018-01-06: 1 mg via INTRAVENOUS

## 2018-01-06 MED ORDER — POVIDONE-IODINE 10 % EX OINT
TOPICAL_OINTMENT | CUTANEOUS | Status: DC | PRN
  Administered 2018-01-06: 1 via TOPICAL

## 2018-01-06 MED ORDER — SUGAMMADEX SODIUM 200 MG/2ML IV SOLN
INTRAVENOUS | Status: DC | PRN
  Administered 2018-01-06: 200 mg via INTRAVENOUS

## 2018-01-06 SURGICAL SUPPLY — 48 items
BLADE CLIPPER SURG (BLADE) ×2 IMPLANT
CANISTER SUCT 3000ML PPV (MISCELLANEOUS) IMPLANT
CHLORAPREP W/TINT 26ML (MISCELLANEOUS) ×2 IMPLANT
COVER SURGICAL LIGHT HANDLE (MISCELLANEOUS) ×2 IMPLANT
DRAPE LAPAROSCOPIC ABDOMINAL (DRAPES) ×2 IMPLANT
DRAPE WARM FLUID 44X44 (DRAPE) ×2 IMPLANT
DRSG OPSITE POSTOP 4X10 (GAUZE/BANDAGES/DRESSINGS) IMPLANT
DRSG OPSITE POSTOP 4X12 (GAUZE/BANDAGES/DRESSINGS) ×2 IMPLANT
DRSG OPSITE POSTOP 4X8 (GAUZE/BANDAGES/DRESSINGS) IMPLANT
ELECT BLADE 6.5 EXT (BLADE) IMPLANT
ELECT CAUTERY BLADE 6.4 (BLADE) ×2 IMPLANT
ELECT REM PT RETURN 9FT ADLT (ELECTROSURGICAL) ×2
ELECTRODE REM PT RTRN 9FT ADLT (ELECTROSURGICAL) ×1 IMPLANT
GLOVE BIO SURGEON STRL SZ 6.5 (GLOVE) ×2 IMPLANT
GLOVE BIO SURGEON STRL SZ7.5 (GLOVE) ×2 IMPLANT
GLOVE BIOGEL PI IND STRL 6.5 (GLOVE) ×1 IMPLANT
GLOVE BIOGEL PI IND STRL 7.0 (GLOVE) ×2 IMPLANT
GLOVE BIOGEL PI IND STRL 7.5 (GLOVE) ×2 IMPLANT
GLOVE BIOGEL PI IND STRL 8 (GLOVE) ×1 IMPLANT
GLOVE BIOGEL PI INDICATOR 6.5 (GLOVE) ×1
GLOVE BIOGEL PI INDICATOR 7.0 (GLOVE) ×2
GLOVE BIOGEL PI INDICATOR 7.5 (GLOVE) ×2
GLOVE BIOGEL PI INDICATOR 8 (GLOVE) ×1
GLOVE ECLIPSE 7.5 STRL STRAW (GLOVE) ×2 IMPLANT
GOWN STRL REUS W/ TWL LRG LVL3 (GOWN DISPOSABLE) ×3 IMPLANT
GOWN STRL REUS W/TWL LRG LVL3 (GOWN DISPOSABLE) ×3
KIT BASIN OR (CUSTOM PROCEDURE TRAY) ×2 IMPLANT
KIT TURNOVER KIT B (KITS) ×2 IMPLANT
LIGASURE IMPACT 36 18CM CVD LR (INSTRUMENTS) IMPLANT
NS IRRIG 1000ML POUR BTL (IV SOLUTION) ×4 IMPLANT
PACK GENERAL/GYN (CUSTOM PROCEDURE TRAY) ×2 IMPLANT
PAD ARMBOARD 7.5X6 YLW CONV (MISCELLANEOUS) ×2 IMPLANT
PENCIL SMOKE EVACUATOR (MISCELLANEOUS) ×2 IMPLANT
SEPRAFILM PROCEDURAL PACK 3X5 (MISCELLANEOUS) IMPLANT
SPECIMEN JAR LARGE (MISCELLANEOUS) IMPLANT
SPONGE LAP 18X18 X RAY DECT (DISPOSABLE) IMPLANT
STAPLER VISISTAT 35W (STAPLE) ×2 IMPLANT
SUCTION POOLE TIP (SUCTIONS) ×2 IMPLANT
SUT NOVA 1 T20/GS 25DT (SUTURE) IMPLANT
SUT PDS AB 1 TP1 96 (SUTURE) ×4 IMPLANT
SUT SILK 2 0 SH CR/8 (SUTURE) ×2 IMPLANT
SUT SILK 2 0 TIES 10X30 (SUTURE) ×2 IMPLANT
SUT SILK 3 0 SH CR/8 (SUTURE) ×2 IMPLANT
SUT SILK 3 0 TIES 10X30 (SUTURE) ×2 IMPLANT
TOWEL OR 17X26 10 PK STRL BLUE (TOWEL DISPOSABLE) ×2 IMPLANT
TRAY FOLEY MTR SLVR 16FR STAT (SET/KITS/TRAYS/PACK) ×2 IMPLANT
TUBE CONNECTING 12X1/4 (SUCTIONS) ×2 IMPLANT
YANKAUER SUCT BULB TIP NO VENT (SUCTIONS) IMPLANT

## 2018-01-06 NOTE — Progress Notes (Signed)
[facility]/[name] Progress Note    Subjective: Although the patient has had some flatus, his distension is far from resolving.  NGT output is moderate.    Objective: Vital signs in last 24 hours: Temp:  [97.9 F (36.6 C)-99 F (37.2 C)] 98.7 F (37.1 C) (08/23 0512) Pulse Rate:  [58-92] 84 (08/23 0512) Resp:  [17-19] 17 (08/23 0512) BP: (127-149)/(79-102) 147/95 (08/23 0512) SpO2:  [93 %-97 %] 93 % (08/23 0512)    Intake/Output from previous day: 08/22 0701 - 08/23 0700 In: 800 [I.V.:800] Out: 875 [Urine:425; Emesis/NG output:450] Intake/Output this shift: No intake/output data recorded.  General: No acute distress, but has not had bowel movement  Lungs: Clear to auscultation  Abd: Distended, active bowel sounds witout tinkles or rushes.  Not tender.    Extremities: No changes.  No clinical signs or symptoms of DVT  Neuro: Intact  Lab Results:  @LABLAST2 (wbc:2,hgb:2,hct:2,plt:2) BMET ) Recent Labs    01/04/18 1054 01/05/18 0643  NA 138 139  K 3.8 4.1  CL 101 101  CO2 25 30  GLUCOSE 108* 119*  BUN 14 9  CREATININE 1.02 0.91  CALCIUM 9.8 9.3   PT/INR No results for input(s): LABPROT, INR in the last 72 hours. ABG No results for input(s): PHART, HCO3 in the last 72 hours.  Invalid input(s): PCO2, PO2  Studies/Results: Ct Abdomen Pelvis W Contrast  Addendum Date: 01/04/2018   ADDENDUM REPORT: 01/04/2018 14:41 ADDENDUM: The dilated small bowel is seen distal to the area of apparent wall thickening and irregularity of the proximal jejunum. The jejunum proximal to this is decompressed. This is opposite of what would typically be expected for a bowel obstruction, where the bowel proximal to the obstruction would be dilated. The more distal small bowel is decompressed without a discrete transition point. Findings still may reflect a partial mechanical obstruction more distally. Alternatively, findings could reflect a small bowel adynamic ileus. The abnormal appearance of  the small bowel in the left mid abdomen, where there is a transition in small bowel caliber, remains concerning for inflammation or potentially neoplastic disease. This could be further assessed with a small bowel follow up exam. Electronically Signed   By: Lajean Manes M.D.   On: 01/04/2018 14:41   Result Date: 01/04/2018 CLINICAL DATA:  There has been a gi bug in the home involving vomiting and diarrhea. Dry heaves on sundayPatient has had a history of constipation and took dulcolax on Monday. Patient reports good results-large amount and loose.Generalized abdominal pain, diarrhea. Reports 5 watery stools today, minimal solid particulate to stool. Vomiting x 1 this morning. Patient is belching, but no relief. Current abdomen radiographs demonstrate a bowel gas pattern suggesting a small bowel obstruction. EXAM: CT ABDOMEN AND PELVIS WITH CONTRAST TECHNIQUE: Multidetector CT imaging of the abdomen and pelvis was performed using the standard protocol following bolus administration of intravenous contrast. CONTRAST:  133mL ISOVUE-300 IOPAMIDOL (ISOVUE-300) INJECTION 61% COMPARISON:  Abdomen radiographs, 01/04/2018 FINDINGS: Lower chest: Subsegmental atelectasis.  No acute findings. Hepatobiliary: Decreased attenuation of the liver consistent with fatty infiltration. No liver mass or focal lesion. Normal gallbladder. No bile duct dilation. Pancreas: There is an oval masslike area along the distal pancreatic tail that measures 5 x 2.2 x 2.4 cm. This has the same attenuation as the normal spleen on the initial post-contrast and delayed sequences. This is most likely an accessory spleen abutting the pancreatic tail. Pancreas is otherwise unremarkable. Spleen: Spleen normal in overall size.  No splenic mass lesion. Adrenals/Urinary Tract:  No adrenal masses. Kidneys normal size, orientation and position with symmetric enhancement and excretion. 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 No renal  masses or stones.  No hydronephrosis. Normal ureters.  Normal bladder. Stomach/Bowel: There is distended small bowel with air-fluid levels consistent with a partial small bowel obstruction. There is a transition point between decompressed proximal jejunum and dilated jejunum and ileum. This transition point lies in the left mid abdomen. The bowel wall appears irregular dislocation. There is a more gradual decrease in small bowel caliber between the mid ileum to the distal ileum with the terminal ileum being decompressed. There is no other evidence of small bowel wall thickening or irregularity. No mesenteric inflammation. Stomach is unremarkable. Colon is normal in caliber. No wall thickening or inflammation. Normal appendix visualized. Vascular/Lymphatic: Aortic atherosclerosis. No enlarged abdominal or pelvic lymph nodes. Reproductive: Status post prostatectomy.  No pelvic masses. Other: Trace amount ascites collects in the posterior pelvic recess. No abdominal wall hernia. Musculoskeletal: No fracture or acute finding. No osteoblastic or osteolytic lesions. There are significant lumbar spine disc and facet degenerative changes. IMPRESSION: 1. Partial small bowel obstruction. There is a transition point in the left mid abdomen. This may be due to adhesions. However, the wall appears irregularly thickened in this location. Cannot exclude inflammation or even neoplastic disease as the etiology. There is an associated trace amount of ascites. 2. There is a more gradual decrease in small bowel caliber along the ileum with the terminal ileum being decompressed. There is no additional discrete transition point. No other evidence of bowel wall thickening or irregularity. 3. Questionable pancreatic tail mass is most likely an accessory spleen abutting the pancreatic tail. This could be confirmed with MRI. 4. Hepatic steatosis. 5. Aortic atherosclerosis. Electronically Signed: By: Lajean Manes M.D. On: 01/04/2018 13:35    Dg Abd Acute W/chest  Result Date: 01/04/2018 CLINICAL DATA:  Onset of stomach pain 3 days ago. Recent constipation followed by nausea vomiting and diarrhea. EXAM: DG ABDOMEN ACUTE W/ 1V CHEST COMPARISON:  No recent studies in [facility] FINDINGS: The lungs are reasonably well inflated. There is minimal increased density at the left lung base laterally. The heart is top-normal in size. The pulmonary vascularity is normal. There is calcification in the wall of the aortic arch. Within the abdomen there are numerous loops of moderately distended gas and fluid-filled small bowel. No significant colonic distention is observed. There is stool and gas in the rectum. There are no abnormal soft tissue calcifications. There surgical clips in the pelvis. There is mild degenerative disc disease in the mid to lower lumbar spine. IMPRESSION: Findings compatible with a distal small bowel obstruction. There is no evidence of perforation. Left basilar atelectasis or scarring. Mild enlargement of the cardiac silhouette without evidence of CHF. Thoracic aortic atherosclerosis. Electronically Signed   By: David  Martinique M.D.   On: 01/04/2018 09:49   Dg Abd Portable 1v-small Bowel Obstruction Protocol-initial, 8 Hr Delay  Result Date: 01/05/2018 CLINICAL DATA:  Small-bowel obstruction EXAM: PORTABLE ABDOMEN - 1 VIEW COMPARISON:  01/04/2018 FINDINGS: NG tube in the stomach. Small-bowel loops are more dilated on the current study. Colon decompressed. Small amount of gas in the rectum. IMPRESSION: Progressive small bowel dilatation compatible with worsening small bowel obstruction. Electronically Signed   By: Franchot Gallo M.D.   On: 01/05/2018 07:38   Dg Abd Portable 1v-small Bowel Protocol-position Verification  Result Date: 01/04/2018 CLINICAL DATA:  Gastric tube placement EXAM: PORTABLE ABDOMEN -  1 VIEW COMPARISON:  01/04/2018 CT FINDINGS: Gastric tube with side port extend into the distal half of the stomach. Dilated small  bowel loops up to 4 cm in caliber consistent with known partial or early SBO is demonstrated. No free air. The included heart size is enlarged. There is aortic atherosclerosis without aneurysm. Right shoulder arthroplasty is partially included. IMPRESSION: Gastric tube extends into the stomach. Dilated small bowel loops are identified consistent with known partial or early SBO. Electronically Signed   By: Ashley Royalty M.D.   On: 01/04/2018 17:03    Anti-infectives: Anti-infectives (From admission, onward)   None      Assessment/Plan: s/p  Repeat lab work and X-rays.  Very likely to require surgery today.  LOS: 1 day   Kathryne Eriksson. Dahlia Bailiff, MD, FACS [phone] [facility] Surgery 01/06/2018

## 2018-01-06 NOTE — Anesthesia Procedure Notes (Signed)
Procedure Name: Intubation Date/Time: 01/06/2018 9:25 AM Performed by: Mariea Clonts, CRNA Pre-anesthesia Checklist: Patient identified, Emergency Drugs available, Suction available and Patient being monitored Patient Re-evaluated:Patient Re-evaluated prior to induction Oxygen Delivery Method: Circle System Utilized Preoxygenation: Pre-oxygenation with 100% oxygen Induction Type: IV induction, Rapid sequence and Cricoid Pressure applied Laryngoscope Size: Miller and 2 Grade View: Grade II Tube type: Oral Tube size: 7.5 mm Number of attempts: 1 Airway Equipment and Method: Stylet and Oral airway Placement Confirmation: ETT inserted through vocal cords under direct vision,  positive ETCO2 and breath sounds checked- equal and bilateral Tube secured with: Tape Dental Injury: Teeth and Oropharynx as per pre-operative assessment

## 2018-01-06 NOTE — Plan of Care (Signed)
  Problem: Education: Goal: Knowledge of General Education information will improve Description: Including pain rating scale, medication(s)/side effects and non-pharmacologic comfort measures Outcome: Progressing   Problem: Nutrition: Goal: Adequate nutrition will be maintained Outcome: Not Progressing   Problem: Pain Managment: Goal: General experience of comfort will improve Outcome: Progressing   

## 2018-01-06 NOTE — Consult Note (Signed)
            [facility] CM Primary Care Navigator  01/06/2018  Kyle Wilcox Jan 20, 1951 [id number]   Went to see patient at the bedside to identify possible discharge needs but he is off the unit in the OR for surgery. (Exploratory Laparotomy; Lysis of adhesion)  Per MD note, he was admitted for worsening abdominal pain and distention- unresolved small bowel obstruction.   Will attempt to see patient at another time when he is available in the room.    Addendum (01/09/18):  Went back to see patient and wife (Kyle Wilcox)at the bedside to identify possible discharge needs. Patient reportshaving lower abdominal pain and discomfort, nausea and vomiting that had led to this admission/ surgery.  Patient endorsesMary Ardath Wilcox, [place] with [facility] the primary care provider.   Patient shared [place] and [employer] Order service to obtain medications without any problem.   Patientstatesmanaging his ownmedications at home with use of "pill box" system filled weekly.  Patienthas been driving prior to admission/ surgery but wife will beable toprovide transportation to [place] discharge.  According to patient,wife will be his primary caregiver at [facility] discharge.  Anticipated discharge plan ishome per patient.  Patientand wifevoiced understanding to call primary care provider's office for a post discharge follow-up appointment within a1- 2 weeks orsooner if needs arise. Patient letter (with PCP's contact number) wasprovided as a reminder.  Discussed with patientand wiferegarding [facility] CM services available for health managementandresourcesat homebutbothdeniedany concerns or needs at this point. Patientand wifeverbalizedunderstandingof needto seekreferral from primary care provider to [facility] care management ifdeemed necessary and appropriatefor anyservicesin  thefuture.  [facility] care management information was provided for futureneeds thatpatient may have.  Patienthowever, verbally agreedand optedforEMMIcalls tofollow-up with his recovery at home.   Referral made for [facility] General calls after discharge.    For additional questions please contact:  Edwena Felty A. Alvera Tourigny, BSN, RN-BC [facility] PRIMARY CARE Navigator Cell: [phone]

## 2018-01-06 NOTE — Transfer of Care (Signed)
Immediate Anesthesia Transfer of Care Note  Patient: Kyle Wilcox  Procedure(s) Performed: EXPLORATORY LAPAROTOMY (N/A Abdomen) LYSIS OF ADHESION (N/A Abdomen)  Patient Location: PACU  Anesthesia Type:General  Level of Consciousness: awake, alert  and oriented  Airway & Oxygen Therapy: Patient Spontanous Breathing and Patient connected to nasal cannula oxygen  Post-op Assessment: Report given to RN, Post -op Vital signs reviewed and stable and Patient moving all extremities X 4  Post vital signs: Reviewed and stable  Last Vitals:  Vitals Value Taken Time  BP 157/91 01/06/2018 11:05 AM  Temp    Pulse 80 01/06/2018 11:07 AM  Resp 15 01/06/2018 11:07 AM  SpO2 92 % 01/06/2018 11:07 AM  Vitals shown include unvalidated device data.  Last Pain:  Vitals:   01/06/18 0512  TempSrc: Oral  PainSc:          Complications: No apparent anesthesia complications

## 2018-01-06 NOTE — Anesthesia Preprocedure Evaluation (Addendum)
Anesthesia Evaluation  Patient identified by MRN, date of birth, ID band Patient awake    Reviewed: Allergy & Precautions, NPO status , Patient's Chart, lab work & pertinent test results  Airway Mallampati: II  TM Distance: >3 FB Neck ROM: Full  Mouth opening: Limited Mouth Opening  Dental no notable dental hx. (+) Teeth Intact, Dental Advisory Given   Pulmonary former smoker,    Pulmonary exam normal breath sounds clear to auscultation       Cardiovascular Normal cardiovascular exam Rhythm:Regular Rate:Normal     Neuro/Psych negative neurological ROS  negative psych ROS   GI/Hepatic negative GI ROS, Neg liver ROS,   Endo/Other  negative endocrine ROS  Renal/GU negative Renal ROS  negative genitourinary   Musculoskeletal  (+) Arthritis ,   Abdominal   Peds  Hematology negative hematology ROS (+)   Anesthesia Other Findings SBO  Reproductive/Obstetrics                            Anesthesia Physical Anesthesia Plan  ASA: II and emergent  Anesthesia Plan: General   Post-op Pain Management:    Induction: Intravenous  PONV Risk Score and Plan: 2 and Dexamethasone, Ondansetron and Midazolam  Airway Management Planned: Oral ETT  Additional Equipment:   Intra-op Plan:   Post-operative Plan: Extubation in OR  Informed Consent: I have reviewed the patients History and Physical, chart, labs and discussed the procedure including the risks, benefits and alternatives for the proposed anesthesia with the patient or authorized representative who has indicated his/her understanding and acceptance.   Dental advisory given  Plan Discussed with: CRNA  Anesthesia Plan Comments:         Anesthesia Quick Evaluation

## 2018-01-06 NOTE — Op Note (Signed)
OPERATIVE REPORT  DATE OF OPERATION: 01/06/2018  PATIENT:  Kyle Wilcox  67 y.o. male  PRE-OPERATIVE DIAGNOSIS:  SMALL BOWEL OBSTRUCTION  POST-OPERATIVE DIAGNOSIS:  SMALL BOWEL OBSTRUCTION  INDICATION(S) FOR OPERATION:  Unresolved small bowel obstruction.  FINDINGS:  Lower abdominal adhesive band attaching the omentum to the with some distal small bowel ischemia.  Excellent mesenteric perfusion by palaption of a pulse by myself and the assistant surgeon   PROCEDURE:  Procedure(s): EXPLORATORY LAPAROTOMY LYSIS OF ADHESION  SURGEON:  Surgeon(s): Judeth Horn, MD  ASSISTANT:  Rosendo Gros, MD  Focht, PA-C  ANESTHESIA:   general  COMPLICATIONS:  None  EBL: <50 ml  BLOOD ADMINISTERED: none  DRAINS: Nasogastric Tube and Urinary Catheter (Foley)   SPECIMEN:  No Specimen  COUNTS CORRECT:  YES  PROCEDURE DETAILS: The patient was taken to the operating room and placed on the table in supine position.  After adequate general endotracheal anesthetic was administered, he was prepped and draped in usual sterile manner exposing his abdomen.  A proper timeout was performed identifying the patient and procedure to be performed.  We started with a lower midline incision down to the pubic crest to his previous incision for his open prostatectomy.  We took down to into the midline fascia.  Upon entering the peritoneal cavity we could see stop ischemic small bowel in the pelvic area and adhesive band of omentum attached to an appendix epiploica of the sigmoid colon.  The colon was completely viable as was the small bowel although it did show signs of bruising and or ischemia.  We mobilized the small bowel and found that there was continued adhesions into the right lower quadrant where the terminal ileum dove into the right colon.  There was some distention of the right colon also but upon inspection of the colon from the right colon transverse colon and down to the descending colon there is no  evidence of obstruction.  We ran the small bowel from the terminal ileum all the way back approximately to the ligament of Treitz.  There were multiple sites of what appeared to be bruising on the antimesenteric side of the small bowel but no frank infarction.  The bowel seemed to return to its normal color the less than we manipulated it and the longer we went to the case.  We could palpate actual pulses of the mesentery up to the bowel wall in all levels.  This confirmed by the assistant surgeon.  We ran the bowel small bowel several times.  And after we cut the adhesive band in the early in the case we went ahead and closed.  No resection of small bowel was performed.  We closed the fascia using running looped #1 PDS suture.  Skin was closed using stainless steel staples.  Prior to closure we did extend the incision above the umbilicus with several centimeters just to make sure that there were no additional obstructive problems.  All needle counts, sponge counts, and instrument counts were correct.  PATIENT DISPOSITION:  PACU - hemodynamically stable.   Judeth Horn 8/23/201910:47 AM

## 2018-01-07 LAB — CBC WITH DIFFERENTIAL/PLATELET
BASOS ABS: 0 10*3/uL (ref 0.0–0.1)
BASOS PCT: 0 %
EOS PCT: 0 %
Eosinophils Absolute: 0 10*3/uL (ref 0.0–0.7)
HCT: 45.4 % (ref 39.0–52.0)
HEMOGLOBIN: 14.9 g/dL (ref 13.0–17.0)
LYMPHS ABS: 1 10*3/uL (ref 0.7–4.0)
LYMPHS PCT: 13 %
MCH: 30.1 pg (ref 26.0–34.0)
MCHC: 32.8 g/dL (ref 30.0–36.0)
MCV: 91.7 fL (ref 78.0–100.0)
MONOS PCT: 21 %
Monocytes Absolute: 1.7 10*3/uL — ABNORMAL HIGH (ref 0.1–1.0)
NEUTROS ABS: 5.2 10*3/uL (ref 1.7–7.7)
Neutrophils Relative %: 66 %
Platelets: 257 10*3/uL (ref 150–400)
RBC: 4.95 MIL/uL (ref 4.22–5.81)
RDW: 13.4 % (ref 11.5–15.5)
WBC: 7.9 10*3/uL (ref 4.0–10.5)

## 2018-01-07 LAB — BASIC METABOLIC PANEL
Anion gap: 9 (ref 5–15)
BUN: 9 mg/dL (ref 8–23)
CHLORIDE: 100 mmol/L (ref 98–111)
CO2: 28 mmol/L (ref 22–32)
CREATININE: 0.92 mg/dL (ref 0.61–1.24)
Calcium: 8.6 mg/dL — ABNORMAL LOW (ref 8.9–10.3)
GFR calc Af Amer: >60 mL/min (ref >60)
GFR calc non Af Amer: >60 mL/min (ref >60)
Glucose, Bld: 114 mg/dL — ABNORMAL HIGH (ref 70–99)
Potassium: 4.4 mmol/L (ref 3.5–5.1)
SODIUM: 137 mmol/L (ref 135–145)

## 2018-01-07 LAB — LACTIC ACID, PLASMA: LACTIC ACID, VENOUS: 1.5 mmol/L (ref 0.5–1.9)

## 2018-01-07 NOTE — Progress Notes (Signed)
1 Day Post-Op   Subjective/Chief Complaint: Complains of back pain   Objective: Vital signs in last 24 hours: Temp:  [97.8 F (36.6 C)-98.8 F (37.1 C)] 98.8 F (37.1 C) (08/24 0548) Pulse Rate:  [78-89] 85 (08/24 0548) Resp:  [14-20] 17 (08/24 0548) BP: (128-171)/(76-102) 137/76 (08/24 0548) SpO2:  [87 %-99 %] 99 % (08/24 0548)    Intake/Output from previous day: 08/23 0701 - 08/24 0700 In: 3741.1 [P.O.:390; I.V.:3351.1] Out: 2200 [Urine:1500; Emesis/NG output:650; Blood:50] Intake/Output this shift: No intake/output data recorded.  General appearance: alert and cooperative Resp: clear to auscultation bilaterally Cardio: regular rate and rhythm GI: soft, distended. quiet  Lab Results:  Recent Labs    01/06/18 0754 01/07/18 0459  WBC 7.0 7.9  HGB 16.3 14.9  HCT 48.3 45.4  PLT 246 257   BMET Recent Labs    01/06/18 0754 01/07/18 0459  NA 137 137  K 4.3 4.4  CL 99 100  CO2 31 28  GLUCOSE 113* 114*  BUN 9 9  CREATININE 0.90 0.92  CALCIUM 9.6 8.6*   PT/INR No results for input(s): LABPROT, INR in the last 72 hours. ABG No results for input(s): PHART, HCO3 in the last 72 hours.  Invalid input(s): PCO2, PO2  Studies/Results: Dg Abd 1 View  Result Date: 01/06/2018 CLINICAL DATA:  Small bowel obstruction. EXAM: ABDOMEN - 1 VIEW COMPARISON:  01/05/2018 and 01/04/2018 and CT scan dated 01/04/2018 FINDINGS: There is persistent small bowel obstruction with slightly increased dilatation of small bowel loops in the left side of the abdomen. NG tube tip is in the fundus of the stomach. There is gas in the nondistended colon. No acute bone abnormality. IMPRESSION: Progressive small bowel obstruction with increased dilatation of the small bowel. Electronically Signed   By: Lorriane Shire M.D.   On: 01/06/2018 08:07    Anti-infectives: Anti-infectives (From admission, onward)   Start     Dose/Rate Route Frequency Ordered Stop   01/06/18 0829  cefoTEtan in Dextrose  5% (CEFOTAN) 2-2.08 GM-%(50ML) IVPB    Note to Pharmacy:  [facility]   : cabinet override      01/06/18 0829 01/06/18 2044   01/06/18 0730  cefoTEtan (CEFOTAN) 2 g in sodium chloride 0.9 % 100 mL IVPB     2 g 200 mL/hr over 30 Minutes Intravenous On call to O.R. 01/06/18 5449 01/06/18 0955      Assessment/Plan: s/p Procedure(s): EXPLORATORY LAPAROTOMY (N/A) LYSIS OF ADHESION (N/A) Continue ng and bowel rest  POD 1 from LOA Ambulate D/c foley  LOS: 2 days    TOTH III,PAUL S 01/07/2018

## 2018-01-08 ENCOUNTER — Inpatient Hospital Stay (HOSPITAL_COMMUNITY): Payer: Medicare Other

## 2018-01-08 LAB — LACTIC ACID, PLASMA: LACTIC ACID, VENOUS: 1.2 mmol/L (ref 0.5–1.9)

## 2018-01-08 MED ORDER — METOCLOPRAMIDE HCL 5 MG/ML IJ SOLN
5.0000 mg | Freq: Three times a day (TID) | INTRAMUSCULAR | Status: AC
  Administered 2018-01-08 (×3): 5 mg via INTRAVENOUS
  Filled 2018-01-08 (×3): qty 2

## 2018-01-08 NOTE — Anesthesia Postprocedure Evaluation (Signed)
Anesthesia Post Note  Patient: Kyle Wilcox  Procedure(s) Performed: EXPLORATORY LAPAROTOMY (N/A Abdomen) LYSIS OF ADHESION (N/A Abdomen)     Patient location during evaluation: PACU Anesthesia Type: General Level of consciousness: awake and alert Pain management: pain level controlled Vital Signs Assessment: post-procedure vital signs reviewed and stable Respiratory status: spontaneous breathing, nonlabored ventilation, respiratory function stable and patient connected to nasal cannula oxygen Cardiovascular status: blood pressure returned to baseline and stable Postop Assessment: no apparent nausea or vomiting Anesthetic complications: no    Last Vitals:  Vitals:   01/08/18 0501 01/08/18 1407  BP: 137/88 (!) 142/82  Pulse: (!) 104 83  Resp:  17  Temp:  36.8 C  SpO2:  98%    Last Pain:  Vitals:   01/08/18 1407  TempSrc: Oral  PainSc: 0-No pain                 [name] L Roena Sassaman

## 2018-01-08 NOTE — Progress Notes (Signed)
2 Days Post-Op   Subjective/Chief Complaint: Still complains of back pain   Objective: Vital signs in last 24 hours: Temp:  [98.1 F (36.7 C)-98.8 F (37.1 C)] 98.1 F (36.7 C) (08/25 0459) Pulse Rate:  [87-110] 104 (08/25 0501) Resp:  [16-18] 18 (08/25 0459) BP: (121-165)/(82-88) 137/88 (08/25 0501) SpO2:  [94 %-96 %] 94 % (08/25 0459) Last BM Date: 01/05/18  Intake/Output from previous day: 08/24 0701 - 08/25 0700 In: 2353.8 [I.V.:2263.8; NG/GT:90] Out: 1450 [Urine:400; Emesis/NG output:1050] Intake/Output this shift: No intake/output data recorded.  General appearance: alert and cooperative Resp: clear to auscultation bilaterally Cardio: regular rate and rhythm GI: distended, minimal tenderness. quiet  Lab Results:  Recent Labs    01/06/18 0754 01/07/18 0459  WBC 7.0 7.9  HGB 16.3 14.9  HCT 48.3 45.4  PLT 246 257   BMET Recent Labs    01/06/18 0754 01/07/18 0459  NA 137 137  K 4.3 4.4  CL 99 100  CO2 31 28  GLUCOSE 113* 114*  BUN 9 9  CREATININE 0.90 0.92  CALCIUM 9.6 8.6*   PT/INR No results for input(s): LABPROT, INR in the last 72 hours. ABG No results for input(s): PHART, HCO3 in the last 72 hours.  Invalid input(s): PCO2, PO2  Studies/Results: No results found.  Anti-infectives: Anti-infectives (From admission, onward)   Start     Dose/Rate Route Frequency Ordered Stop   01/06/18 0829  cefoTEtan in Dextrose 5% (CEFOTAN) 2-2.08 GM-%(50ML) IVPB    Note to Pharmacy:  [facility]   : cabinet override      01/06/18 0829 01/06/18 2044   01/06/18 0730  cefoTEtan (CEFOTAN) 2 g in sodium chloride 0.9 % 100 mL IVPB     2 g 200 mL/hr over 30 Minutes Intravenous On call to O.R. 01/06/18 4580 01/06/18 0955      Assessment/Plan: s/p Procedure(s): EXPLORATORY LAPAROTOMY (N/A) LYSIS OF ADHESION (N/A) continue ng and bowel rest.  Check placement of ng. May need to be advanced Ambulate Will start reglan for ileus POD 2 LOA  LOS: 3 days     TOTH III,PAUL S 01/08/2018

## 2018-01-09 ENCOUNTER — Encounter (HOSPITAL_COMMUNITY): Payer: Self-pay | Admitting: General Surgery

## 2018-01-09 LAB — CBC WITH DIFFERENTIAL/PLATELET
ABS IMMATURE GRANULOCYTES: 0.1 10*3/uL (ref 0.0–0.1)
BASOS PCT: 0 %
Basophils Absolute: 0 10*3/uL (ref 0.0–0.1)
EOS ABS: 0.2 10*3/uL (ref 0.0–0.7)
Eosinophils Relative: 1 %
HEMATOCRIT: 43 % (ref 39.0–52.0)
HEMOGLOBIN: 14.3 g/dL (ref 13.0–17.0)
Immature Granulocytes: 0 %
Lymphocytes Relative: 9 %
Lymphs Abs: 1.1 10*3/uL (ref 0.7–4.0)
MCH: 30.6 pg (ref 26.0–34.0)
MCHC: 33.3 g/dL (ref 30.0–36.0)
MCV: 91.9 fL (ref 78.0–100.0)
Monocytes Absolute: 1.5 10*3/uL — ABNORMAL HIGH (ref 0.1–1.0)
Monocytes Relative: 14 %
NEUTROS PCT: 76 %
Neutro Abs: 8.6 10*3/uL — ABNORMAL HIGH (ref 1.7–7.7)
Platelets: 329 10*3/uL (ref 150–400)
RBC: 4.68 MIL/uL (ref 4.22–5.81)
RDW: 13.3 % (ref 11.5–15.5)
WBC: 11.4 10*3/uL — ABNORMAL HIGH (ref 4.0–10.5)

## 2018-01-09 LAB — BASIC METABOLIC PANEL
Anion gap: 7 (ref 5–15)
BUN: 10 mg/dL (ref 8–23)
CHLORIDE: 99 mmol/L (ref 98–111)
CO2: 29 mmol/L (ref 22–32)
CREATININE: 0.83 mg/dL (ref 0.61–1.24)
Calcium: 8.7 mg/dL — ABNORMAL LOW (ref 8.9–10.3)
GFR calc Af Amer: >60 mL/min (ref >60)
GFR calc non Af Amer: >60 mL/min (ref >60)
Glucose, Bld: 111 mg/dL — ABNORMAL HIGH (ref 70–99)
POTASSIUM: 3.9 mmol/L (ref 3.5–5.1)
Sodium: 135 mmol/L (ref 135–145)

## 2018-01-09 MED ORDER — MENTHOL 3 MG MT LOZG
1.0000 | LOZENGE | OROMUCOSAL | Status: DC | PRN
  Administered 2018-01-09: 3 mg via ORAL
  Filled 2018-01-09: qty 9

## 2018-01-09 MED ORDER — PHENOL 1.4 % MT LIQD
1.0000 | OROMUCOSAL | Status: DC | PRN
  Administered 2018-01-09 – 2018-01-11 (×2): 1 via OROMUCOSAL
  Filled 2018-01-09: qty 177

## 2018-01-09 NOTE — Care Management Important Message (Signed)
Important Message  Patient Details  Name: Kyle Wilcox MRN: [id number] Date of Birth: [birth year]   Medicare Important Message Given:  Yes    Orbie Pyo 01/09/2018, 4:23 PM

## 2018-01-09 NOTE — Progress Notes (Signed)
Patient ID: Kyle Wilcox, male   DOB: [birth year], 67 y.o.   MRN: [id number]    3 Days Post-Op  Subjective: Pt ambulating in the halls.  No new complaints.  No flatus.  Still with quite a bit of NGT output.   Objective: Vital signs in last 24 hours: Temp:  [98.3 F (36.8 C)-98.9 F (37.2 C)] 98.9 F (37.2 C) (08/26 0441) Pulse Rate:  [83-111] 103 (08/26 0441) Resp:  [16-18] 16 (08/26 0441) BP: (117-147)/(81-89) 147/89 (08/26 0441) SpO2:  [96 %-98 %] 96 % (08/26 0441) Last BM Date: 01/05/18  Intake/Output from previous day: 08/25 0701 - 08/26 0700 In: 2590 [P.O.:60; I.V.:2530] Out: 1050 [Urine:350; Emesis/NG output:700] Intake/Output this shift: Total I/O In: -  Out: 700 [Emesis/NG output:700]  PE: Heart: regular Lungs: CTAB Abd: soft, still distended and tympanitic, no BS, midline incision is c/d/i with honeycomb dressing in place and betadine goo underneath.  Lab Results:  Recent Labs    01/07/18 0459 01/09/18 0502  WBC 7.9 11.4*  HGB 14.9 14.3  HCT 45.4 43.0  PLT 257 329   BMET Recent Labs    01/07/18 0459 01/09/18 0502  NA 137 135  K 4.4 3.9  CL 100 99  CO2 28 29  GLUCOSE 114* 111*  BUN 9 10  CREATININE 0.92 0.83  CALCIUM 8.6* 8.7*   PT/INR No results for input(s): LABPROT, INR in the last 72 hours. CMP     Component Value Date/Time   NA 135 01/09/2018 0502   K 3.9 01/09/2018 0502   CL 99 01/09/2018 0502   CO2 29 01/09/2018 0502   GLUCOSE 111 (H) 01/09/2018 0502   BUN 10 01/09/2018 0502   CREATININE 0.83 01/09/2018 0502   CREATININE 0.96 09/19/2017 1046   CALCIUM 8.7 (L) 01/09/2018 0502   PROT 7.7 01/04/2018 1054   ALBUMIN 4.2 01/04/2018 1054   AST 26 01/04/2018 1054   ALT 28 01/04/2018 1054   ALKPHOS 74 01/04/2018 1054   BILITOT 0.8 01/04/2018 1054   GFRNONAA >60 01/09/2018 0502   GFRNONAA 81 09/19/2017 1046   GFRAA >60 01/09/2018 0502   GFRAA 94 09/19/2017 1046   Lipase     Component Value Date/Time   LIPASE 25 01/04/2018 1054        Studies/Results: Dg Abd Portable 1v  Result Date: 01/08/2018 CLINICAL DATA:  Ileus EXAM: PORTABLE ABDOMEN - 1 VIEW COMPARISON:  01/06/2018 FINDINGS: Enteric tube terminates in the proximal stomach. Multiple dilated loops of small bowel in the central abdomen. Colon is not decompressed. Skin staples overlying the midline lower abdomen. Surgical clips overlying the pelvis. IMPRESSION: Multiple dilated loops of small and large bowel. In the setting of recent surgery, this is compatible with the reported history of adynamic ileus. Partial small bowel obstruction is also possible. Electronically Signed   By: Julian Hy M.D.   On: 01/08/2018 10:33    Anti-infectives: Anti-infectives (From admission, onward)   Start     Dose/Rate Route Frequency Ordered Stop   01/06/18 0829  cefoTEtan in Dextrose 5% (CEFOTAN) 2-2.08 GM-%(50ML) IVPB    Note to Pharmacy:  [facility]   : cabinet override      01/06/18 0829 01/06/18 2044   01/06/18 0730  cefoTEtan (CEFOTAN) 2 g in sodium chloride 0.9 % 100 mL IVPB     2 g 200 mL/hr over 30 Minutes Intravenous On call to O.R. 01/06/18 8546 01/06/18 0955       Assessment/Plan SBO, POD 3, s/p ex lap with  LOA by Dr. Hulen Skains on 01/06/18  -pt with post op ileus -cont NGT -mobilize and pulm toilet -net + several liters since admit.  Will decrease fluids to 75cc/hr to avoid overload  FEN - NPO/NGT/IVFs VTE - SCDs/Lovenox ID - none Follow up - [facility]   LOS: 4 days    Henreitta Cea , [facility] 01/09/2018, 10:34 AM Pager: [phone]

## 2018-01-09 NOTE — Plan of Care (Signed)
  Problem: Education: Goal: Knowledge of General Education information will improve Description: Including pain rating scale, medication(s)/side effects and non-pharmacologic comfort measures Outcome: Progressing   Problem: Health Behavior/Discharge Planning: Goal: Ability to manage health-related needs will improve Outcome: Progressing   Problem: Clinical Measurements: Goal: Ability to maintain clinical measurements within normal limits will improve Outcome: Progressing Goal: Will remain free from infection Outcome: Progressing Goal: Diagnostic test results will improve Outcome: Progressing Goal: Respiratory complications will improve Outcome: Progressing Goal: Cardiovascular complication will be avoided Outcome: Progressing   Problem: Activity: Goal: Risk for activity intolerance will decrease Outcome: Progressing   Problem: Coping: Goal: Level of anxiety will decrease Outcome: Progressing   Problem: Elimination: Goal: Will not experience complications related to bowel motility Outcome: Progressing Goal: Will not experience complications related to urinary retention Outcome: Progressing   Problem: Pain Managment: Goal: General experience of comfort will improve Outcome: Progressing   

## 2018-01-10 LAB — HEMOGLOBIN AND HEMATOCRIT, BLOOD
HEMATOCRIT: 44 % (ref 39.0–52.0)
Hemoglobin: 14.4 g/dL (ref 13.0–17.0)

## 2018-01-10 MED ORDER — BISACODYL 10 MG RE SUPP
10.0000 mg | Freq: Every day | RECTAL | Status: DC | PRN
  Administered 2018-01-10: 10 mg via RECTAL
  Filled 2018-01-10: qty 1

## 2018-01-10 MED ORDER — LORAZEPAM 2 MG/ML IJ SOLN
0.5000 mg | Freq: Every evening | INTRAMUSCULAR | Status: DC | PRN
  Administered 2018-01-10 – 2018-01-11 (×2): 0.5 mg via INTRAVENOUS
  Filled 2018-01-10 (×2): qty 1

## 2018-01-10 MED ORDER — PANTOPRAZOLE SODIUM 40 MG IV SOLR
40.0000 mg | Freq: Two times a day (BID) | INTRAVENOUS | Status: DC
  Administered 2018-01-10 – 2018-01-11 (×4): 40 mg via INTRAVENOUS
  Filled 2018-01-10 (×5): qty 40

## 2018-01-10 MED ORDER — ENOXAPARIN SODIUM 40 MG/0.4ML ~~LOC~~ SOLN: 40.0000 mg | SUBCUTANEOUS | Status: DC

## 2018-01-10 MED ORDER — LORATADINE 10 MG PO TABS
10.0000 mg | ORAL_TABLET | Freq: Every day | ORAL | Status: DC
  Administered 2018-01-10 – 2018-01-13 (×4): 10 mg via ORAL
  Filled 2018-01-10 (×4): qty 1

## 2018-01-10 NOTE — Progress Notes (Signed)
[facility] Progress Note  4 Days Post-Op  Subjective: CC: ileus Patient with continued ileus, no flatus yet but feels like it is close. Had some nasal drainage early this AM and reported RN turned NGT to increased suction. Bloody drainage noted in NGT. Patient denies worsened abdominal pain or distention. He does report that he is having trouble sleeping at night. Ambulating plenty during the days.   Objective: Vital signs in last 24 hours: Temp:  [98.2 F (36.8 C)-98.5 F (36.9 C)] 98.5 F (36.9 C) (08/27 0446) Pulse Rate:  [89-109] 89 (08/27 0446) Resp:  [16-18] 18 (08/27 0446) BP: (114-142)/(82-90) 142/90 (08/27 0446) SpO2:  [96 %-97 %] 96 % (08/27 0446) Last BM Date: 01/05/18  Intake/Output from previous day: 08/26 0701 - 08/27 0700 In: 2295.1 [P.O.:110; I.V.:2185.1] Out: 3200 [Urine:300; Emesis/NG GGYIRS:8546] Intake/Output this shift: No intake/output data recorded.  PE: Gen:  Alert, NAD, pleasant ENT: bloody drainage from NGT Card:  Regular rate and rhythm, pedal pulses 2+ BL Pulm:  Normal effort, clear to auscultation bilaterally Abd: Soft, minimally tender, moderately distended, bowel sounds hypoactive, no HSM, incision C/D/I with staples present Skin: warm and dry, no rashes  Psych: A&Ox3   Lab Results:  Recent Labs    01/09/18 0502  WBC 11.4*  HGB 14.3  HCT 43.0  PLT 329   BMET Recent Labs    01/09/18 0502  NA 135  K 3.9  CL 99  CO2 29  GLUCOSE 111*  BUN 10  CREATININE 0.83  CALCIUM 8.7*   PT/INR No results for input(s): LABPROT, INR in the last 72 hours. CMP     Component Value Date/Time   NA 135 01/09/2018 0502   K 3.9 01/09/2018 0502   CL 99 01/09/2018 0502   CO2 29 01/09/2018 0502   GLUCOSE 111 (H) 01/09/2018 0502   BUN 10 01/09/2018 0502   CREATININE 0.83 01/09/2018 0502   CREATININE 0.96 09/19/2017 1046   CALCIUM 8.7 (L) 01/09/2018 0502   PROT 7.7 01/04/2018 1054   ALBUMIN 4.2 01/04/2018 1054   AST 26 01/04/2018  1054   ALT 28 01/04/2018 1054   ALKPHOS 74 01/04/2018 1054   BILITOT 0.8 01/04/2018 1054   GFRNONAA >60 01/09/2018 0502   GFRNONAA 81 09/19/2017 1046   GFRAA >60 01/09/2018 0502   GFRAA 94 09/19/2017 1046   Lipase     Component Value Date/Time   LIPASE 25 01/04/2018 1054       Studies/Results: Dg Abd Portable 1v  Result Date: 01/08/2018 CLINICAL DATA:  Ileus EXAM: PORTABLE ABDOMEN - 1 VIEW COMPARISON:  01/06/2018 FINDINGS: Enteric tube terminates in the proximal stomach. Multiple dilated loops of small bowel in the central abdomen. Colon is not decompressed. Skin staples overlying the midline lower abdomen. Surgical clips overlying the pelvis. IMPRESSION: Multiple dilated loops of small and large bowel. In the setting of recent surgery, this is compatible with the reported history of adynamic ileus. Partial small bowel obstruction is also possible. Electronically Signed   By: Julian Hy M.D.   On: 01/08/2018 10:33    Anti-infectives: Anti-infectives (From admission, onward)   Start     Dose/Rate Route Frequency Ordered Stop   01/06/18 0829  cefoTEtan in Dextrose 5% (CEFOTAN) 2-2.08 GM-%(50ML) IVPB    Note to Pharmacy:  [facility]   : cabinet override      01/06/18 0829 01/06/18 2044   01/06/18 0730  cefoTEtan (CEFOTAN) 2 g in sodium chloride 0.9 % 100 mL IVPB  2 g 200 mL/hr over 30 Minutes Intravenous On call to O.R. 01/06/18 9937 01/06/18 0955       Assessment/Plan Hx of prostate cancer Degenerative disc disease Peripheral neuropathy HLD Glaucoma Seasonal allergies - claritin   SBO - s/p ex lap with LOA by Dr. Hulen Skains on 01/06/18  - POD#4 - pt with post op ileus - cont NGT - on LIWS only  - mobilize and pulm toilet - start IV protonix BID for GI bleeding, hold lovenox today, recheck CBC in AM - dulcolax suppository prn   FEN -NPO/NGT/IVFs; limit ice chips to 2-3 cups in 24h VTE -SCDs/ hold Lovenox ID -none Follow up - [facility]  LOS: 5 days     Brigid Re , [facility] 01/10/2018, 8:41 AM Pager: [phone] Consults: [phone] Mon-Fri 7:00 am-4:30 pm Sat-Sun 7:00 am-11:30 am

## 2018-01-10 NOTE — Plan of Care (Signed)
  Problem: Education: Goal: Knowledge of General Education information will improve Description Including pain rating scale, medication(s)/side effects and non-pharmacologic comfort measures Outcome: Progressing   Problem: Health Behavior/Discharge Planning: Goal: Ability to manage health-related needs will improve Outcome: Progressing   Problem: Clinical Measurements: Goal: Ability to maintain clinical measurements within normal limits will improve Outcome: Progressing Goal: Will remain free from infection Outcome: Progressing Goal: Diagnostic test results will improve Outcome: Progressing   Problem: Activity: Goal: Risk for activity intolerance will decrease Outcome: Progressing   Problem: Safety: Goal: Ability to remain free from injury will improve Outcome: Progressing   

## 2018-01-11 LAB — BASIC METABOLIC PANEL
Anion gap: 8 (ref 5–15)
BUN: 12 mg/dL (ref 8–23)
CHLORIDE: 103 mmol/L (ref 98–111)
CO2: 27 mmol/L (ref 22–32)
CREATININE: 0.89 mg/dL (ref 0.61–1.24)
Calcium: 8.8 mg/dL — ABNORMAL LOW (ref 8.9–10.3)
GFR calc non Af Amer: >60 mL/min (ref >60)
Glucose, Bld: 110 mg/dL — ABNORMAL HIGH (ref 70–99)
Potassium: 3.8 mmol/L (ref 3.5–5.1)
SODIUM: 138 mmol/L (ref 135–145)

## 2018-01-11 LAB — CBC
HEMATOCRIT: 39.2 % (ref 39.0–52.0)
HEMOGLOBIN: 12.9 g/dL — AB (ref 13.0–17.0)
MCH: 30.4 pg (ref 26.0–34.0)
MCHC: 32.9 g/dL (ref 30.0–36.0)
MCV: 92.2 fL (ref 78.0–100.0)
Platelets: 330 10*3/uL (ref 150–400)
RBC: 4.25 MIL/uL (ref 4.22–5.81)
RDW: 13.4 % (ref 11.5–15.5)
WBC: 10.1 10*3/uL (ref 4.0–10.5)

## 2018-01-11 MED ORDER — ENOXAPARIN SODIUM 40 MG/0.4ML ~~LOC~~ SOLN
40.0000 mg | SUBCUTANEOUS | Status: DC
  Administered 2018-01-12: 40 mg via SUBCUTANEOUS
  Filled 2018-01-11 (×2): qty 0.4

## 2018-01-11 NOTE — Progress Notes (Signed)
[facility] Progress Note  5 Days Post-Op  Subjective: CC: ileus Pt reports 2 BMs overnight with suppository but not really passing flatus. Tolerating having NGT clamped and denies nausea or vomiting. Taking ice chips with tube clamped. Denies abdominal pain, except with coughing. Was able to sleep last night with ativan.   Objective: Vital signs in last 24 hours: Temp:  [97.8 F (36.6 C)-98.3 F (36.8 C)] 98.3 F (36.8 C) (08/28 0553) Pulse Rate:  [65-86] 65 (08/28 0553) Resp:  [16] 16 (08/28 0553) BP: (109-128)/(78-82) 109/78 (08/28 0553) SpO2:  [96 %-97 %] 96 % (08/28 0553) Last BM Date: 01/10/18  Intake/Output from previous day: 08/27 0701 - 08/28 0700 In: 1828.8 [I.V.:1828.8] Out: 800 [Emesis/NG output:800] Intake/Output this shift: No intake/output data recorded.  PE: Gen:  Alert, NAD, pleasant Card:  Regular rate and rhythm, pedal pulses 2+ BL Pulm:  Normal effort, clear to auscultation bilaterally Abd: Soft, minimally tender, moderately distended, bowel sounds hypoactive, no HSM, incision C/D/I with staples present Skin: warm and dry, no rashes  Psych: A&Ox3   Lab Results:  Recent Labs    01/09/18 0502 01/10/18 1013 01/11/18 0446  WBC 11.4*  --  10.1  HGB 14.3 14.4 12.9*  HCT 43.0 44.0 39.2  PLT 329  --  330   BMET Recent Labs    01/09/18 0502 01/11/18 0446  NA 135 138  K 3.9 3.8  CL 99 103  CO2 29 27  GLUCOSE 111* 110*  BUN 10 12  CREATININE 0.83 0.89  CALCIUM 8.7* 8.8*   PT/INR No results for input(s): LABPROT, INR in the last 72 hours. CMP     Component Value Date/Time   NA 138 01/11/2018 0446   K 3.8 01/11/2018 0446   CL 103 01/11/2018 0446   CO2 27 01/11/2018 0446   GLUCOSE 110 (H) 01/11/2018 0446   BUN 12 01/11/2018 0446   CREATININE 0.89 01/11/2018 0446   CREATININE 0.96 09/19/2017 1046   CALCIUM 8.8 (L) 01/11/2018 0446   PROT 7.7 01/04/2018 1054   ALBUMIN 4.2 01/04/2018 1054   AST 26 01/04/2018 1054   ALT 28  01/04/2018 1054   ALKPHOS 74 01/04/2018 1054   BILITOT 0.8 01/04/2018 1054   GFRNONAA >60 01/11/2018 0446   GFRNONAA 81 09/19/2017 1046   GFRAA >60 01/11/2018 0446   GFRAA 94 09/19/2017 1046   Lipase     Component Value Date/Time   LIPASE 25 01/04/2018 1054       Studies/Results: No results found.  Anti-infectives: Anti-infectives (From admission, onward)   Start     Dose/Rate Route Frequency Ordered Stop   01/06/18 0829  cefoTEtan in Dextrose 5% (CEFOTAN) 2-2.08 GM-%(50ML) IVPB    Note to Pharmacy:  [facility]   : cabinet override      01/06/18 0829 01/06/18 2044   01/06/18 0730  cefoTEtan (CEFOTAN) 2 g in sodium chloride 0.9 % 100 mL IVPB     2 g 200 mL/hr over 30 Minutes Intravenous On call to O.R. 01/06/18 6948 01/06/18 0955       Assessment/Plan Hx of prostate cancer Degenerative disc disease Peripheral neuropathy HLD Glaucoma Seasonal allergies - claritin   SBO - s/p ex lap with LOA by Dr. Hulen Skains on 01/06/18 - POD#5 - pt with post op ileus - starting to improve - removed NGT, Pt may have sips of clears today - mobilize and pulm toilet - dulcolax suppository prn  ABL anemia - Hgb 12.9 from 14.4 yesterday, likely  from UGI bleeding - continue to hold lovenox and monitor - continue BID protonix  FEN -sips of clears, IVF VTE -SCDs/ hold Lovenox ID -none Follow up - [facility]   LOS: 6 days    Brigid Re , [facility] Surgery 01/11/2018, 8:30 AM Pager: [phone] Consults: [phone] Mon-Fri 7:00 am-4:30 pm Sat-Sun 7:00 am-11:30 am

## 2018-01-12 LAB — BASIC METABOLIC PANEL
Anion gap: 7 (ref 5–15)
BUN: 9 mg/dL (ref 8–23)
CALCIUM: 8.8 mg/dL — AB (ref 8.9–10.3)
CO2: 26 mmol/L (ref 22–32)
CREATININE: 0.86 mg/dL (ref 0.61–1.24)
Chloride: 105 mmol/L (ref 98–111)
Glucose, Bld: 105 mg/dL — ABNORMAL HIGH (ref 70–99)
Potassium: 3.8 mmol/L (ref 3.5–5.1)
SODIUM: 138 mmol/L (ref 135–145)

## 2018-01-12 LAB — CBC
HCT: 40.6 % (ref 39.0–52.0)
Hemoglobin: 13.3 g/dL (ref 13.0–17.0)
MCH: 29.8 pg (ref 26.0–34.0)
MCHC: 32.8 g/dL (ref 30.0–36.0)
MCV: 90.8 fL (ref 78.0–100.0)
Platelets: 343 10*3/uL (ref 150–400)
RBC: 4.47 MIL/uL (ref 4.22–5.81)
RDW: 13.4 % (ref 11.5–15.5)
WBC: 8.2 10*3/uL (ref 4.0–10.5)

## 2018-01-12 MED ORDER — PANTOPRAZOLE SODIUM 40 MG PO TBEC
40.0000 mg | DELAYED_RELEASE_TABLET | Freq: Every day | ORAL | Status: DC
  Administered 2018-01-12 – 2018-01-13 (×2): 40 mg via ORAL
  Filled 2018-01-12 (×2): qty 1

## 2018-01-12 MED ORDER — ACETAMINOPHEN 325 MG PO TABS: 650.0000 mg | ORAL_TABLET | Freq: Four times a day (QID) | ORAL | Status: DC | PRN

## 2018-01-12 MED ORDER — TRAMADOL HCL 50 MG PO TABS: 50.0000 mg | ORAL_TABLET | Freq: Four times a day (QID) | ORAL | Status: DC | PRN

## 2018-01-12 MED ORDER — MORPHINE SULFATE (PF) 2 MG/ML IV SOLN: 1.0000 mg | INTRAVENOUS | Status: DC | PRN

## 2018-01-12 NOTE — Progress Notes (Signed)
[facility] Progress Note  6 Days Post-Op  Subjective: CC: no complaints Pt reports minimal abdominal pain. Tolerating CLD and having bowel function. Left hand is swollen, IV infiltrated but not red or painful. Patient very grateful for care.   Objective: Vital signs in last 24 hours: Temp:  [97.8 F (36.6 C)-98.2 F (36.8 C)] 98.2 F (36.8 C) (08/28 2053) Pulse Rate:  [74-86] 74 (08/29 0528) Resp:  [16-18] 16 (08/29 0528) BP: (117-132)/(69-93) 119/69 (08/29 0528) SpO2:  [95 %-99 %] 95 % (08/29 0528) Last BM Date: 01/11/18  Intake/Output from previous day: 08/28 0701 - 08/29 0700 In: 2353.1 [P.O.:600; I.V.:1753.1] Out: -  Intake/Output this shift: No intake/output data recorded.  PE: Gen: Alert, NAD, pleasant Card: Regular rate and rhythm, pedal pulses 2+ BL Pulm: Normal effort, clear to auscultation bilaterally Abd: Soft,nottender, notdistended, bowel soundshypoactive, no HSM, incision C/D/Iwith staples present Skin: warm and dry, no rashes  Psych: A&Ox3   Lab Results:  Recent Labs    01/11/18 0446 01/12/18 0615  WBC 10.1 8.2  HGB 12.9* 13.3  HCT 39.2 40.6  PLT 330 343   BMET Recent Labs    01/11/18 0446 01/12/18 0615  NA 138 138  K 3.8 3.8  CL 103 105  CO2 27 26  GLUCOSE 110* 105*  BUN 12 9  CREATININE 0.89 0.86  CALCIUM 8.8* 8.8*   PT/INR No results for input(s): LABPROT, INR in the last 72 hours. CMP     Component Value Date/Time   NA 138 01/12/2018 0615   K 3.8 01/12/2018 0615   CL 105 01/12/2018 0615   CO2 26 01/12/2018 0615   GLUCOSE 105 (H) 01/12/2018 0615   BUN 9 01/12/2018 0615   CREATININE 0.86 01/12/2018 0615   CREATININE 0.96 09/19/2017 1046   CALCIUM 8.8 (L) 01/12/2018 0615   PROT 7.7 01/04/2018 1054   ALBUMIN 4.2 01/04/2018 1054   AST 26 01/04/2018 1054   ALT 28 01/04/2018 1054   ALKPHOS 74 01/04/2018 1054   BILITOT 0.8 01/04/2018 1054   GFRNONAA >60 01/12/2018 0615   GFRNONAA 81 09/19/2017 1046   GFRAA >60 01/12/2018 0615   GFRAA 94 09/19/2017 1046   Lipase     Component Value Date/Time   LIPASE 25 01/04/2018 1054       Studies/Results: No results found.  Anti-infectives: Anti-infectives (From admission, onward)   Start     Dose/Rate Route Frequency Ordered Stop   01/06/18 0829  cefoTEtan in Dextrose 5% (CEFOTAN) 2-2.08 GM-%(50ML) IVPB    Note to Pharmacy:  [facility]   : cabinet override      01/06/18 0829 01/06/18 2044   01/06/18 0730  cefoTEtan (CEFOTAN) 2 g in sodium chloride 0.9 % 100 mL IVPB     2 g 200 mL/hr over 30 Minutes Intravenous On call to O.R. 01/06/18 3300 01/06/18 0955       Assessment/Plan Hx of prostate cancer Degenerative disc disease Peripheral neuropathy HLD Glaucoma Seasonal allergies - claritin  SBO-s/p ex lap with LOA by Dr. Hulen Skains on 01/06/18 - POD#6 - pt tolerating CLD and having bowel function - advance to FLD today and likely soft diet in the AM - mobilize and pulm toilet - dulcolax suppository prn ABL anemia - Hgb 13, stable - continue protonix - can switch to PO and once daily   FEN -FLD, SLIV VTE -SCDs,Lovenox ID -none Follow up - [facility]  LOS: 7 days    Brigid Re , [facility] 01/12/2018,  8:52 AM Pager: [phone] Consults: [phone] Mon-Fri 7:00 am-4:30 pm Sat-Sun 7:00 am-11:30 am

## 2018-01-12 NOTE — Discharge Instructions (Signed)
[facility]      [facility] [phone]  OPEN ABDOMINAL SURGERY: POST OP INSTRUCTIONS  Always review your discharge instruction sheet given to you by the facility where your surgery was performed.  IF YOU HAVE DISABILITY OR FAMILY LEAVE FORMS, YOU MUST BRING THEM TO THE OFFICE FOR PROCESSING.  PLEASE DO NOT GIVE THEM TO YOUR DOCTOR.  1. A prescription for pain medication may be given to you upon discharge.  Take your pain medication as prescribed, if needed.  If narcotic pain medicine is not needed, then you may take acetaminophen (Tylenol) or ibuprofen (Advil) as needed. 2. Take your usually prescribed medications unless otherwise directed. 3. If you need a refill on your pain medication, please contact your pharmacy. They will contact our office to request authorization.  Prescriptions will not be filled after 5pm or on week-ends. 4. You should follow a light diet the first few days after arrival home, such as soup and crackers, pudding, etc.unless your doctor has advised otherwise. A high-fiber, low fat diet can be resumed as tolerated.   Be sure to include lots of fluids daily. Most patients will experience some swelling and bruising on the chest and neck area.  Ice packs will help.  Swelling and bruising can take several days to resolve 5. Most patients will experience some swelling and bruising in the area of the incision. Ice pack will help. Swelling and bruising can take several days to resolve..  6. It is common to experience some constipation if taking pain medication after surgery.  Increasing fluid intake and taking a stool softener will usually help or prevent this problem from occurring.  A mild laxative (Milk of Magnesia or Miralax) should be taken according to package directions if there are no bowel movements after 48 hours. 7.  You may have steri-strips (small skin tapes) in place directly over the incision.  These strips should be left on the skin for 7-10 days.  If your  surgeon used skin glue on the incision, you may shower in 24 hours.  The glue will flake off over the next 2-3 weeks.  Any sutures or staples will be removed at the office during your follow-up visit. You may find that a light gauze bandage over your incision may keep your staples from being rubbed or pulled. You may shower and replace the bandage daily. 8. ACTIVITIES:  You may resume regular (light) daily activities beginning the next day--such as daily self-care, walking, climbing stairs--gradually increasing activities as tolerated.  You may have sexual intercourse when it is comfortable.  Refrain from any heavy lifting or straining until approved by your doctor. a. You may drive when you no longer are taking prescription pain medication, you can comfortably wear a seatbelt, and you can safely maneuver your car and apply brakes b. Return to Work: ___________________________________ 9. You should see your doctor in the office for a follow-up appointment approximately two weeks after your surgery.  Make sure that you call for this appointment within a day or two after you arrive home to insure a convenient appointment time. OTHER INSTRUCTIONS:  _____________________________________________________________ _____________________________________________________________  WHEN TO CALL YOUR DOCTOR: 1. Fever over 101.0 2. Inability to urinate 3. Nausea and/or vomiting 4. Extreme swelling or bruising 5. Continued bleeding from incision. 6. Increased pain, redness, or drainage from the incision. 7. Difficulty swallowing or breathing 8. Muscle cramping or spasms. 9. Numbness or tingling in hands or feet or around lips.  The clinic staff is available to   answer your questions during regular business hours.  Please don't hesitate to call and ask to speak to one of the nurses if you have concerns.  For further questions, please visit [email or web address]   

## 2018-01-13 MED ORDER — ACETAMINOPHEN 325 MG PO TABS: 650.0000 mg | ORAL_TABLET | Freq: Four times a day (QID) | ORAL | Status: DC | PRN

## 2018-01-13 MED ORDER — PANTOPRAZOLE SODIUM 40 MG PO TBEC: 40.0000 mg | DELAYED_RELEASE_TABLET | Freq: Every day | ORAL | 0 refills | Status: DC

## 2018-01-13 MED ORDER — TRAMADOL HCL 50 MG PO TABS: 50.0000 mg | ORAL_TABLET | Freq: Four times a day (QID) | ORAL | 0 refills | Status: DC | PRN

## 2018-01-13 NOTE — Discharge Summary (Signed)
[facility] Discharge Summary   Patient ID: Kyle Wilcox MRN: [id number] DOB/AGE: 68-08-1950 68 y.o.  Admit date: 01/04/2018 Discharge date: 01/13/2018  Admitting Diagnosis: SBO  Discharge Diagnosis SBO - s/p exploratory surgery and lysis of adhesions Post-operative ileus, resolved  Consultants None  Imaging: No results found.  Procedures Dr. Hulen Skains (01/06/18) - Exploratory laparotomy, lysis of adhesions  Hospital Course:  Patient is a 68 year old male who presented to [facility] urgent care with generalized abdominal pain.  Workup showed SBO.  Patient was admitted and started on the small bowel protocol. Patient failed to improve with conservative treatment and underwent procedure listed above.  Tolerated procedure well and was transferred to the floor. Initially developed a post-operative ileus but by POD#5 this was improving. Diet was advanced as tolerated.  On POD#7, the patient was voiding well, tolerating diet, ambulating well, pain well controlled, vital signs stable, incision c/d/i and felt stable for discharge home.  Patient will follow up in our office in 1 weeks and knows to call with questions or concerns.  He will call to confirm appointment date/time.    Physical Exam: Gen: Alert, NAD, pleasant Card: Regular rate and rhythm, pedal pulses 2+ BL Pulm: Normal effort, clear to auscultation bilaterally Abd: Soft,nottender, notdistended, bowel soundshypoactive, no HSM, incision C/D/Iwith staples present Skin: warm and dry, no rashes  Ext: L hand swelling improving, no erythema Psych: A&Ox3  Allergies as of 01/13/2018   No Known Allergies     Medication List    TAKE these medications   acetaminophen 325 MG tablet Commonly known as:  TYLENOL Take 2 tablets (650 mg total) by mouth every 6 (six) hours as needed for mild pain.   ASPIRIN 81 81 MG EC tablet Generic drug:  aspirin Take 81 mg by mouth daily. Swallow whole.   gabapentin 600 MG  tablet Commonly known as:  NEURONTIN Take 1 tablet (600 mg total) by mouth 2 (two) times daily.   latanoprost 0.005 % ophthalmic solution Commonly known as:  XALATAN Place 1 drop into both eyes at bedtime.   meloxicam 7.5 MG tablet Commonly known as:  MOBIC Take 1 tablet (7.5 mg total) daily by mouth.   ONE DAILY MULTIPLE VITAMIN PO Take 1 tablet by mouth daily at 12 noon.   pantoprazole 40 MG tablet Commonly known as:  PROTONIX Take 1 tablet (40 mg total) by mouth daily. Start taking on:  01/14/2018   RHOPRESSA 0.02 % Soln Generic drug:  Netarsudil Dimesylate Place 1 drop into both eyes daily at 12 noon.   SIMBRINZA 1-0.2 % Susp Generic drug:  Brinzolamide-Brimonidine Place 1 drop into both eyes 2 (two) times daily.   simvastatin 20 MG tablet Commonly known as:  ZOCOR Take 1 tablet (20 mg total) by mouth daily.   traMADol 50 MG tablet Commonly known as:  ULTRAM Take 1 tablet (50 mg total) by mouth every 6 (six) hours as needed for moderate pain.   traZODone 50 MG tablet Commonly known as:  DESYREL Take 0.5-1 tablets (25-50 mg total) at bedtime as needed by mouth for sleep.        Follow-up Information    [facility], [place]. Go on 01/20/2018.   Specialty:  General Surgery Why:  Staple removal scheduled for 10:00 AM. Please arrive 30 min prior to appointment time. Bring photo ID and insurance information.  Contact information: [place] [address] [phone]        Judeth Horn, MD. Daphane Shepherd  on 01/30/2018.   Specialty:  General Surgery Why:  Follow appointment sheduled for 9:50 AM. Please arrive 15 min prior to appointment time. Bring photo ID and insurance information.  Contact information: [address] [place] [address] [phone]           Signed: Brigid Re, [facility] 01/13/2018, 8:53 AM Pager: [phone] Consults: [phone] Mon-Fri 7:00 am-4:30 pm Sat-Sun 7:00 am-11:30  am

## 2018-01-26 ENCOUNTER — Inpatient Hospital Stay: Payer: Medicare Other | Admitting: Physician Assistant

## 2018-01-28 ENCOUNTER — Ambulatory Visit (HOSPITAL_COMMUNITY)
Admission: EM | Admit: 2018-01-28 | Discharge: 2018-01-28 | Disposition: A | Discharge status: Home / Self Care | Payer: Medicare Other | Attending: Internal Medicine | Admitting: Internal Medicine

## 2018-01-28 ENCOUNTER — Encounter (HOSPITAL_COMMUNITY): Payer: Self-pay | Admitting: Emergency Medicine

## 2018-01-28 DIAGNOSIS — B356 Tinea cruris: Secondary | ICD-10-CM

## 2018-01-28 DIAGNOSIS — L231 Allergic contact dermatitis due to adhesives: Secondary | ICD-10-CM | POA: Diagnosis not present

## 2018-01-28 MED ORDER — TRIAMCINOLONE ACETONIDE 0.1 % EX CREA: 1.0000 "application " | TOPICAL_CREAM | Freq: Two times a day (BID) | CUTANEOUS | 0 refills | Status: DC

## 2018-01-28 NOTE — ED Provider Notes (Signed)
[place]   [id number] 01/28/18 Arrival Time: 8850  CC: Rash  SUBJECTIVE:  Kyle Wilcox is a 67 y.o. male who presents with a skin complaint that began 2 days ago.  Symptoms began after having stitches removed from incision site and steri-strips placed following surgery for SBO on 01/06/18.  Localizes the rash to abdomen and states it has spread to penis.  Describes it as itchy.  Has tried hydrocortisone, benadryl, and lotrim without relief.  Denies aggravating factors.  Denies previous symptoms in the past.  Denies fever, chills, nausea, vomiting, SOB, chest pain, abdominal pain, changes in bowel or bladder function, penile discharge, testicular swelling or pain.    ROS: As per HPI.  Past Medical History:  Diagnosis Date  . Allergy 2014  . Cancer [facility]) 2015   prostate cancer  . Cataract 2016  . DDD (degenerative disc disease), lumbar 12/24/2014  . Glaucoma   . Hx of colonic polyps 02/19/2011  . Hyperlipidemia 11/07/2014  . Peripheral neuropathy 12/24/2014   Past Surgical History:  Procedure Laterality Date  . COLONOSCOPY    . EYE SURGERY     laser surgery to relieve pressure on eyes- both eyes  . JOINT REPLACEMENT  2011   total shoulder replacement (Right)  . LAPAROTOMY N/A 01/06/2018   Procedure: EXPLORATORY LAPAROTOMY;  Surgeon: Judeth Horn, MD;  Location: [place];  Service: General;  Laterality: N/A;  . LYSIS OF ADHESION N/A 01/06/2018   Procedure: LYSIS OF ADHESION;  Surgeon: Judeth Horn, MD;  Location: [place];  Service: General;  Laterality: N/A;  . PROSTATE SURGERY     2015  . VASECTOMY  1982   No Known Allergies No current facility-administered medications on file prior to encounter.    Current Outpatient Medications on File Prior to Encounter  Medication Sig Dispense Refill  . acetaminophen (TYLENOL) 325 MG tablet Take 2 tablets (650 mg total) by mouth every 6 (six) hours as needed for mild pain.    Marland Kitchen aspirin (ASPIR-81) 81 MG EC tablet Take 81 mg by mouth  daily. Swallow whole.    . Brinzolamide-Brimonidine (SIMBRINZA) 1-0.2 % SUSP Place 1 drop into both eyes 2 (two) times daily.     Marland Kitchen gabapentin (NEURONTIN) 600 MG tablet Take 1 tablet (600 mg total) by mouth 2 (two) times daily. 180 tablet 3  . latanoprost (XALATAN) 0.005 % ophthalmic solution Place 1 drop into both eyes at bedtime.     . meloxicam (MOBIC) 7.5 MG tablet Take 1 tablet (7.5 mg total) daily by mouth. 90 tablet 3  . Netarsudil Dimesylate (RHOPRESSA) 0.02 % SOLN Place 1 drop into both eyes daily at 12 noon.     . ONE DAILY MULTIPLE VITAMIN PO Take 1 tablet by mouth daily at 12 noon.     . pantoprazole (PROTONIX) 40 MG tablet Take 1 tablet (40 mg total) by mouth daily. 7 tablet 0  . simvastatin (ZOCOR) 20 MG tablet Take 1 tablet (20 mg total) by mouth daily. 90 tablet 1  . traMADol (ULTRAM) 50 MG tablet Take 1 tablet (50 mg total) by mouth every 6 (six) hours as needed for moderate pain. 10 tablet 0  . traZODone (DESYREL) 50 MG tablet Take 0.5-1 tablets (25-50 mg total) at bedtime as needed by mouth for sleep. 90 tablet 1   Social History   Socioeconomic History  . Marital status: Married    Spouse name: Not on file  . Number of children: Not on file  . Years of  education: Not on file  . Highest education level: Not on file  Occupational History  . Not on file  Social Needs  . Financial resource strain: Not on file  . Food insecurity:    Worry: Not on file    Inability: Not on file  . Transportation needs:    Medical: Not on file    Non-medical: Not on file  Tobacco Use  . Smoking status: Former Smoker    Last attempt to quit: 03/09/1981    Years since quitting: 36.9  . Smokeless tobacco: Never Used  Substance and Sexual Activity  . Alcohol use: Yes    Alcohol/week: 2.0 standard drinks    Types: 2 Cans of beer per week    Comment: occasionally  . Drug use: No  . Sexual activity: Not Currently  Lifestyle  . Physical activity:    Days per week: Not on file     Minutes per session: Not on file  . Stress: Not on file  Relationships  . Social connections:    Talks on phone: Not on file    Gets together: Not on file    Attends religious service: Not on file    Active member of club or organization: Not on file    Attends meetings of clubs or organizations: Not on file    Relationship status: Not on file  . Intimate partner violence:    Fear of current or ex partner: Not on file    Emotionally abused: Not on file    Physically abused: Not on file    Forced sexual activity: Not on file  Other Topics Concern  . Not on file  Social History Narrative  . Not on file   Family History  Problem Relation Age of Onset  . Arthritis Mother   . Heart disease Mother   . Hyperlipidemia Mother   . Hypertension Mother   . Stroke Mother   . Vision loss Mother   . Arthritis Father   . Depression Father   . Heart disease Father   . Hyperlipidemia Father   . Stroke Father   . Arthritis Sister   . Cancer Sister   . Diabetes Sister   . Hyperlipidemia Sister   . Arthritis Brother   . Diabetes Brother   . Heart disease Brother   . Hyperlipidemia Brother   . Hypertension Brother   . Stomach cancer Maternal Grandfather   . Colon cancer Neg Hx   . Esophageal cancer Neg Hx   . Rectal cancer Neg Hx     OBJECTIVE: Vitals:   01/28/18 1714  BP: 133/70  Pulse: 61  Resp: 16  Temp: 97.8 F (36.6 C)  SpO2: 100%    General appearance: alert; no distress Lungs: clear to auscultation bilaterally Heart: regular rate and rhythm.  Radial pulse 2+ bilaterally Extremities: no edema Skin: warm and dry; well-healing vertical scar to mid abdomen with mild surrounding erythema where steri strips were placed; nontender to palpation; no obvious discharge GU: Declines chaperone.  Circumcised male; erythematous macular rash localized to base of penis; no penile discharge; no testicular masses or nodules appreciated, testicles nontender to palpation, no obvious  swelling; testicles appear symmetrical in size Psychological: alert and cooperative; normal mood and affect  ASSESSMENT & PLAN:  1. Allergic contact dermatitis due to adhesives   2. Tinea cruris     Meds ordered this encounter  Medications  . triamcinolone cream (KENALOG) 0.1 %    Sig: Apply 1 application  topically 2 (two) times daily.    Dispense:  30 g    Refill:  0    Order Specific Question:   Supervising Provider    Answer:   Wynona Luna [[id number]]   Kenalog 0.1% prescribed for abdomen.  Use as directed for symptomatic relief of itching Continue with benadryl as needed for symptomatic relief Continue with lotrimin to groin for symptomatic relief Follow up with surgeon on Monday for further evaluation and management of symptoms Return or go to the ED if you have any new or worsening symptoms  Reviewed expectations re: course of current medical issues. Questions answered. Outlined signs and symptoms indicating need for more acute intervention. Patient verbalized understanding. After Visit Summary given.   Lestine Box, PA-C 01/28/18 1940

## 2018-01-28 NOTE — Discharge Instructions (Addendum)
Kenalog 0.1% prescribed for abdomen.  Use as directed for symptomatic relief of itching Continue with benadryl as needed for symptomatic relief Continue with lotrimin to groin for symptomatic relief Follow up with surgeon on Monday for further evaluation and management of symptoms Return or go to the ED if you have any new or worsening symptoms

## 2018-01-28 NOTE — ED Triage Notes (Signed)
Pt had surgery on aug 23rd for bowel obstruction, discharged 9 days later, had staples removed on 9/6. Pt states he has a rash on the incision site that popped up in the last two days. Pt denies any abdominal pain, just itchy rash on stomach. Pt states he touched the rash and touched his penis and now the rash is on his penis.

## 2018-01-31 DIAGNOSIS — H401213 Low-tension glaucoma, right eye, severe stage: Secondary | ICD-10-CM | POA: Diagnosis not present

## 2018-02-08 ENCOUNTER — Ambulatory Visit (INDEPENDENT_AMBULATORY_CARE_PROVIDER_SITE_OTHER): Payer: Medicare Other | Admitting: Physician Assistant

## 2018-02-08 ENCOUNTER — Encounter: Payer: Self-pay | Admitting: Physician Assistant

## 2018-02-08 VITALS — BP 118/82 | HR 58 | Temp 97.7°F | Resp 18 | Ht 69.0 in | Wt 172.0 lb

## 2018-02-08 DIAGNOSIS — Z09 Encounter for follow-up examination after completed treatment for conditions other than malignant neoplasm: Secondary | ICD-10-CM | POA: Diagnosis not present

## 2018-02-08 DIAGNOSIS — Z23 Encounter for immunization: Secondary | ICD-10-CM | POA: Diagnosis not present

## 2018-02-08 DIAGNOSIS — K56609 Unspecified intestinal obstruction, unspecified as to partial versus complete obstruction: Secondary | ICD-10-CM

## 2018-02-08 NOTE — Progress Notes (Signed)
Patient ID: Kyle Wilcox MRN: [id number], DOB: [birth year], 67 y.o. Date of Encounter: 02/08/2018, 10:56 AM    Chief Complaint:  Chief Complaint  Patient presents with  . Hospitalization Follow-up     HPI: 67 y.o. year old male presents for above.   I reviewed his hospital discharge summary.  Hospitalized 01/04/2018 - 01/13/2018. He had presented to the urgent care with abdominal pain.  Work-up showed SBO.  He was admitted and started on small bowel protocol.  Failed to improve with conservative treatment and underwent procedure.  Underwent exploratory surgery and lysis of adhesions.  He developed a postoperative ileus but by POD #5 this was improving.  Diet was advanced as tolerated.  POD #7 was voiding well, tolerating diet, ambulating well, pain controlled, stable for discharge.  He reports that he has had follow-up with the surgeon.  Wife accompanies him for visit.  She reports that he did develop some itchy rash near the incision site area.   I did review that he did have another visit to the urgent care on 01/28/2018.  Was felt to have allergic dermatitis secondary to the adhesives.  States that this is much better.  Occasionally is still needing to apply some hydrocortisone cream but this is controlling any itching, rash at this point.  He reports that he had lost some weight and weight is still down compared to prior to this issue but has gained back a couple of pounds from where he had gotten to.  Reports that he just had decreased appetite and just was not as hungry.  Otherwise is doing well and has no specific concerns to address.  Feels like he is returning to his normal baseline.     Home Meds:   Outpatient Medications Prior to Visit  Medication Sig Dispense Refill  . aspirin (ASPIR-81) 81 MG EC tablet Take 81 mg by mouth daily. Swallow whole.    . Brinzolamide-Brimonidine (SIMBRINZA) 1-0.2 % SUSP Place 1 drop into both eyes 2 (two) times daily.     Marland Kitchen gabapentin  (NEURONTIN) 600 MG tablet Take 1 tablet (600 mg total) by mouth 2 (two) times daily. 180 tablet 3  . latanoprost (XALATAN) 0.005 % ophthalmic solution Place 1 drop into both eyes at bedtime.     . meloxicam (MOBIC) 7.5 MG tablet Take 1 tablet (7.5 mg total) daily by mouth. 90 tablet 3  . Netarsudil Dimesylate (RHOPRESSA) 0.02 % SOLN Place 1 drop into both eyes daily at 12 noon.     . ONE DAILY MULTIPLE VITAMIN PO Take 1 tablet by mouth daily at 12 noon.     . simvastatin (ZOCOR) 20 MG tablet Take 1 tablet (20 mg total) by mouth daily. 90 tablet 1  . traMADol (ULTRAM) 50 MG tablet Take 1 tablet (50 mg total) by mouth every 6 (six) hours as needed for moderate pain. 10 tablet 0  . traZODone (DESYREL) 50 MG tablet Take 0.5-1 tablets (25-50 mg total) at bedtime as needed by mouth for sleep. 90 tablet 1  . triamcinolone cream (KENALOG) 0.1 % Apply 1 application topically 2 (two) times daily. 30 g 0  . pantoprazole (PROTONIX) 40 MG tablet Take 1 tablet (40 mg total) by mouth daily. 7 tablet 0  . acetaminophen (TYLENOL) 325 MG tablet Take 2 tablets (650 mg total) by mouth every 6 (six) hours as needed for mild pain.     No facility-administered medications prior to visit.     Allergies: No Known Allergies  Review of Systems: See HPI for pertinent ROS. All other ROS negative.    Physical Exam: Blood pressure 118/82, pulse (!) 58, temperature 97.7 F (36.5 C), temperature source Oral, resp. rate 18, height 5\' 9"  (1.753 m), weight 78 kg, SpO2 99 %., Body mass index is 25.4 kg/m. General:  WNWD WM. Appears in no acute distress. Neck: Supple. No thyromegaly. No lymphadenopathy. Lungs: Clear bilaterally to auscultation without wheezes, rales, or rhonchi. Breathing is unlabored. Heart: Regular rhythm. No murmurs, rubs, or gallops. Abdomen: Soft, non-tender, non-distended with normoactive bowel sounds. No hepatomegaly. No rebound/guarding. No obvious abdominal masses. Incision site on abdomen is  healing well.  Msk:  Strength and tone normal for age. Extremities/Skin: Warm and dry.  Neuro: Alert and oriented X 3. Moves all extremities spontaneously. Gait is normal. CNII-XII grossly in tact. Psych:  Responds to questions appropriately with a normal affect.     ASSESSMENT AND PLAN:  67 y.o. year old male with   1. Hospital discharge follow-up  2. SBO (small bowel obstruction) ([place]) He is doing very well.  Everything is stable.  No further treatment necessary at this time.  For follow-up he will follow-up here when scheduled for his next routine visit.  Follow-up sooner if needed.   Signed, [address] [place], [place], [facility] 02/08/2018 10:56 AM

## 2018-03-06 DIAGNOSIS — M19012 Primary osteoarthritis, left shoulder: Secondary | ICD-10-CM | POA: Diagnosis not present

## 2018-03-24 ENCOUNTER — Other Ambulatory Visit: Payer: Self-pay | Admitting: Family Medicine

## 2018-03-27 ENCOUNTER — Encounter: Payer: Medicare Other | Admitting: Physician Assistant

## 2018-03-28 ENCOUNTER — Other Ambulatory Visit: Payer: Self-pay

## 2018-03-28 ENCOUNTER — Encounter: Payer: Self-pay | Admitting: Family Medicine

## 2018-03-28 ENCOUNTER — Ambulatory Visit (INDEPENDENT_AMBULATORY_CARE_PROVIDER_SITE_OTHER): Payer: Medicare Other | Admitting: Family Medicine

## 2018-03-28 VITALS — BP 124/78 | HR 52 | Temp 97.8°F | Resp 16 | Ht 69.0 in | Wt 176.0 lb

## 2018-03-28 DIAGNOSIS — E785 Hyperlipidemia, unspecified: Secondary | ICD-10-CM

## 2018-03-28 DIAGNOSIS — Z131 Encounter for screening for diabetes mellitus: Secondary | ICD-10-CM

## 2018-03-28 DIAGNOSIS — E663 Overweight: Secondary | ICD-10-CM | POA: Insufficient documentation

## 2018-03-28 DIAGNOSIS — Z0000 Encounter for general adult medical examination without abnormal findings: Secondary | ICD-10-CM | POA: Diagnosis not present

## 2018-03-28 DIAGNOSIS — Z6835 Body mass index (BMI) 35.0-35.9, adult: Secondary | ICD-10-CM | POA: Diagnosis not present

## 2018-03-28 DIAGNOSIS — G6289 Other specified polyneuropathies: Secondary | ICD-10-CM

## 2018-03-28 DIAGNOSIS — E669 Obesity, unspecified: Secondary | ICD-10-CM | POA: Diagnosis not present

## 2018-03-28 DIAGNOSIS — Z1159 Encounter for screening for other viral diseases: Secondary | ICD-10-CM | POA: Diagnosis not present

## 2018-03-28 DIAGNOSIS — Z23 Encounter for immunization: Secondary | ICD-10-CM | POA: Diagnosis not present

## 2018-03-28 DIAGNOSIS — F5104 Psychophysiologic insomnia: Secondary | ICD-10-CM

## 2018-03-28 DIAGNOSIS — E66812 Obesity, class 2: Secondary | ICD-10-CM

## 2018-03-28 MED ORDER — ZOSTER VAC RECOMB ADJUVANTED 50 MCG/0.5ML IM SUSR: 0.5000 mL | Freq: Once | INTRAMUSCULAR | 1 refills | Status: AC

## 2018-03-28 NOTE — Patient Instructions (Addendum)
Kyle Wilcox , Thank you for taking time to come for your Medicare Wellness Visit. I appreciate your ongoing commitment to your health goals. Please review the following plan we discussed and let me know if I can assist you in the future.   These are the goals we discussed: Going back to the gym - has lost some weight - diet changes below and we'll recheck levels today    This is a list of the screening recommended for you and due dates:  Health Maintenance  Topic Date Due  .  Hepatitis C: One time screening is recommended by Center for Disease Control  (CDC) for  adults born from [birth year] through 1966.   02-12-1951  . Tetanus Vaccine  03/10/2025  . Colon Cancer Screening  06/01/2026  . Flu Shot  Completed  . Pneumonia vaccines  Completed      Fat and Cholesterol Restricted Diet High levels of fat and cholesterol in your blood may lead to various health problems, such as diseases of the heart, blood vessels, gallbladder, liver, and pancreas. Fats are concentrated sources of energy that come in various forms. Certain types of fat, including saturated fat, may be harmful in excess. Cholesterol is a substance needed by your body in small amounts. Your body makes all the cholesterol it needs. Excess cholesterol comes from the food you eat. When you have high levels of cholesterol and saturated fat in your blood, health problems can develop because the excess fat and cholesterol will gather along the walls of your blood vessels, causing them to narrow. Choosing the right foods will help you control your intake of fat and cholesterol. This will help keep the levels of these substances in your blood within normal limits and reduce your risk of disease. What is my plan? Your health care provider recommends that you:  Limit your fat intake to ______% or less of your total calories per day.  Limit the amount of cholesterol in your diet to less than _________mg per day.  Eat 20-30 grams of fiber each  day.  What types of fat should I choose?  Choose healthy fats more often. Choose monounsaturated and polyunsaturated fats, such as olive and canola oil, flaxseeds, walnuts, almonds, and seeds.  Eat more omega-3 fats. Good choices include salmon, mackerel, sardines, tuna, flaxseed oil, and ground flaxseeds. Aim to eat fish at least two times a week.  Limit saturated fats. Saturated fats are primarily found in animal products, such as meats, butter, and cream. Plant sources of saturated fats include palm oil, palm kernel oil, and coconut oil.  Avoid foods with partially hydrogenated oils in them. These contain trans fats. Examples of foods that contain trans fats are stick margarine, some tub margarines, cookies, crackers, and other baked goods. What general guidelines do I need to follow? These guidelines for healthy eating will help you control your intake of fat and cholesterol:  Check food labels carefully to identify foods with trans fats or high amounts of saturated fat.  Fill one half of your plate with vegetables and green salads.  Fill one fourth of your plate with whole grains. Look for the word "whole" as the first word in the ingredient list.  Fill one fourth of your plate with lean protein foods.  Limit fruit to two servings a day. Choose fruit instead of juice.  Eat more foods that contain fiber, such as apples, broccoli, carrots, beans, peas, and barley.  Eat more home-cooked food and less restaurant, buffet, and  fast food.  Limit or avoid alcohol.  Limit foods high in starch and sugar.  Limit fried foods.  Cook foods using methods other than frying. Baking, boiling, grilling, and broiling are all great options.  Lose weight if you are overweight. Losing just 5-10% of your initial body weight can help your overall health and prevent diseases such as diabetes and heart disease.  What foods can I eat? Grains  Whole grains, such as whole wheat or whole grain breads,  crackers, cereals, and pasta. Unsweetened oatmeal, bulgur, barley, quinoa, or brown rice. Corn or whole wheat flour tortillas. Vegetables  Fresh or frozen vegetables (raw, steamed, roasted, or grilled). Green salads. Fruits  All fresh, canned (in natural juice), or frozen fruits. Meats and other protein foods  Ground beef (85% or leaner), grass-fed beef, or beef trimmed of fat. Skinless chicken or [place]. Ground chicken or [place]. Pork trimmed of fat. All fish and seafood. Eggs. Dried beans, peas, or lentils. Unsalted nuts or seeds. Unsalted canned or dry beans. Dairy  Low-fat dairy products, such as skim or 1% milk, 2% or reduced-fat cheeses, low-fat ricotta or cottage cheese, or plain low-fat yo Fats and oils  Tub margarines without trans fats. Light or reduced-fat mayonnaise and salad dressings. Avocado. Olive, canola, sesame, or safflower oils. Natural peanut or almond butter (choose ones without added sugar and oil). The items listed above may not be a complete list of recommended foods or beverages. Contact your dietitian for more options. Foods to avoid Grains  White bread. White pasta. White rice. Cornbread. Bagels, pastries, and croissants. Crackers that contain trans fat. Vegetables  White potatoes. Corn. Creamed or fried vegetables. Vegetables in a cheese sauce. Fruits  Dried fruits. Canned fruit in light or heavy syrup. Fruit juice. Meats and other protein foods  Fatty cuts of meat. Ribs, chicken wings, bacon, sausage, bologna, salami, chitterlings, fatback, hot dogs, bratwurst, and packaged luncheon meats. Liver and organ meats. Dairy  Whole or 2% milk, cream, half-and-half, and cream cheese. Whole milk cheeses. Whole-fat or sweetened yogurt. Full-fat cheeses. Nondairy creamers and whipped toppings. Processed cheese, cheese spreads, or cheese curds. Beverages  Alcohol. Sweetened drinks (such as sodas, lemonade, and fruit drinks or punches). Fats and oils  Butter,  stick margarine, lard, shortening, ghee, or bacon fat. Coconut, palm kernel, or palm oils. Sweets and desserts  Corn syrup, sugars, honey, and molasses. Candy. Jam and jelly. Syrup. Sweetened cereals. Cookies, pies, cakes, donuts, muffins, and ice cream. The items listed above may not be a complete list of foods and beverages to avoid. Contact your dietitian for more information. This information is not intended to replace advice given to you by your health care provider. Make sure you discuss any questions you have with your health care provider. Document Released: 05/03/2005 Document Revised: 05/24/2014 Document Reviewed: 08/01/2013 Elsevier Interactive Patient Education  2018 [place] Maintenance, Male A healthy lifestyle and preventive care is important for your health and wellness. Ask your health care provider about what schedule of regular examinations is right for you. What should I know about weight and diet? Eat a Healthy Diet  Eat plenty of vegetables, fruits, whole grains, low-fat dairy products, and lean protein.  Do not eat a lot of foods high in solid fats, added sugars, or salt.  Maintain a Healthy Weight Regular exercise can help you achieve or maintain a healthy weight. You should:  Do at least 150 minutes of exercise each week. The exercise should increase your  heart rate and make you sweat (moderate-intensity exercise).  Do strength-training exercises at least twice a week.  Watch Your Levels of Cholesterol and Blood Lipids  Have your blood tested for lipids and cholesterol every 5 years starting at 68 years of age. If you are at high risk for heart disease, you should start having your blood tested when you are 67 years old. You may need to have your cholesterol levels checked more often if: ? Your lipid or cholesterol levels are high. ? You are older than 68 years of age. ? You are at high risk for heart disease.  What should I know about cancer  screening? Many types of cancers can be detected early and may often be prevented. Lung Cancer  You should be screened every year for lung cancer if: ? You are a current smoker who has smoked for at least 30 years. ? You are a former smoker who has quit within the past 15 years.  Talk to your health care provider about your screening options, when you should start screening, and how often you should be screened.  Colorectal Cancer  Routine colorectal cancer screening usually begins at 68 years of age and should be repeated every 5-10 years until you are 68 years old. You may need to be screened more often if early forms of precancerous polyps or small growths are found. Your health care provider may recommend screening at an earlier age if you have risk factors for colon cancer.  Your health care provider may recommend using home test kits to check for hidden blood in the stool.  A small camera at the end of a tube can be used to examine your colon (sigmoidoscopy or colonoscopy). This checks for the earliest forms of colorectal cancer.  Prostate and Testicular Cancer  Depending on your age and overall health, your health care provider may do certain tests to screen for prostate and testicular cancer.  Talk to your health care provider about any symptoms or concerns you have about testicular or prostate cancer.  Skin Cancer  Check your skin from head to toe regularly.  Tell your health care provider about any new moles or changes in moles, especially if: ? There is a change in a mole's size, shape, or color. ? You have a mole that is larger than a pencil eraser.  Always use sunscreen. Apply sunscreen liberally and repeat throughout the day.  Protect yourself by wearing long sleeves, pants, a wide-brimmed hat, and sunglasses when outside.  What should I know about heart disease, diabetes, and high blood pressure?  If you are 66-22 years of age, have your blood pressure checked  every 3-5 years. If you are 80 years of age or older, have your blood pressure checked every year. You should have your blood pressure measured twice-once when you are at a hospital or clinic, and once when you are not at a hospital or clinic. Record the average of the two measurements. To check your blood pressure when you are not at a hospital or clinic, you can use: ? An automated blood pressure machine at a pharmacy. ? A home blood pressure monitor.  Talk to your health care provider about your target blood pressure.  If you are between 77-45 years old, ask your health care provider if you should take aspirin to prevent heart disease.  Have regular diabetes screenings by checking your fasting blood sugar level. ? If you are at a normal weight and have a low  risk for diabetes, have this test once every three years after the age of 15. ? If you are overweight and have a high risk for diabetes, consider being tested at a younger age or more often.  A one-time screening for abdominal aortic aneurysm (AAA) by ultrasound is recommended for men aged 4-75 years who are current or former smokers. What should I know about preventing infection? Hepatitis B If you have a higher risk for hepatitis B, you should be screened for this virus. Talk with your health care provider to find out if you are at risk for hepatitis B infection. Hepatitis C Blood testing is recommended for:  Everyone born from [birth year] through 1966.  Anyone with known risk factors for hepatitis C.  Sexually Transmitted Diseases (STDs)  You should be screened each year for STDs including gonorrhea and chlamydia if: ? You are sexually active and are younger than 68 years of age. ? You are older than 68 years of age and your health care provider tells you that you are at risk for this type of infection. ? Your sexual activity has changed since you were last screened and you are at an increased risk for chlamydia or gonorrhea. Ask your  health care provider if you are at risk.  Talk with your health care provider about whether you are at high risk of being infected with HIV. Your health care provider may recommend a prescription medicine to help prevent HIV infection.  What else can I do?  Schedule regular health, dental, and eye exams.  Stay current with your vaccines (immunizations).  Do not use any tobacco products, such as cigarettes, chewing tobacco, and e-cigarettes. If you need help quitting, ask your health care provider.  Limit alcohol intake to no more than 2 drinks per day. One drink equals 12 ounces of beer, 5 ounces of wine, or 1 ounces of hard liquor.  Do not use street drugs.  Do not share needles.  Ask your health care provider for help if you need support or information about quitting drugs.  Tell your health care provider if you often feel depressed.  Tell your health care provider if you have ever been abused or do not feel safe at home. This information is not intended to replace advice given to you by your health care provider. Make sure you discuss any questions you have with your health care provider. Document Released: 10/30/2007 Document Revised: 12/31/2015 Document Reviewed: 02/04/2015 Elsevier Interactive Patient Education  [employer].

## 2018-03-28 NOTE — Assessment & Plan Note (Signed)
FLP today No SE with simvastatin Continue med Work on diet changes for cholesterol, increase exercise

## 2018-03-28 NOTE — Progress Notes (Signed)
Subjective:   Kyle Wilcox is a 67 y.o. male who presents for Medicare Annual/Subsequent preventive examination.  Has Hx of peripheral neuropathy, DDD, glaucoma, HLD  01/06/18 -  Abdomina surgery, hospitalized for SBO, lysis of adhesions, cleared from Dr. Hulen Skains 01/30/18  Glaucoma - see's ophthomology - Dr. Donnelly Angelica eye Peripheral neuropathy on gabapentin - 600 mg BID, well managed, no SE of meds Insomnia - trazodone 25-50 mg at night, continues to work well for sleep, no SE HLD - simvastain 20 mg daily - no SE, no diet changes, going to start working out more, family hx of high cholesterol  Weight - lost weight after surgery 172 now  Urology - manages [place]?  prostectomy - seeing urology annually they do psa, last appt was Nov 2018, sees next month   Dr. Mardelle Matte - Ortho - [facility] two weeks ago, he wanted to know renal function.  Checking labs today, last labs were in hospital for SBO    Review of Systems:   Review of Systems  Constitutional: Negative.  Negative for activity change, appetite change, fatigue and unexpected weight change.  HENT: Negative.   Eyes: Negative.   Respiratory: Negative.  Negative for shortness of breath.   Cardiovascular: Negative.  Negative for chest pain, palpitations and leg swelling.  Gastrointestinal: Negative.  Negative for abdominal pain and blood in stool.  Endocrine: Negative.   Genitourinary: Negative.  Negative for decreased urine volume, difficulty urinating, testicular pain and urgency.  Skin: Negative.  Negative for color change and pallor.  Allergic/Immunologic: Negative.   Neurological: Negative.  Negative for syncope, weakness, light-headedness and numbness.  Psychiatric/Behavioral: Negative.  Negative for confusion, dysphoric mood, self-injury and suicidal ideas. The patient is not nervous/anxious.   All other systems reviewed and are negative.    Cardiac Risk Factors include: advanced age (>4men, >43 women);male  gender;dyslipidemia;obesity (BMI >30kg/m2)     Objective:    Vitals: BP 124/78   Pulse (!) 52   Temp 97.8 F (36.6 C) (Oral)   Resp 16   Ht 5\' 9"  (1.753 m)   Wt 176 lb (79.8 kg)   SpO2 99%   BMI 25.99 kg/m   Body mass index is 25.99 kg/m.  Physical Exam  Constitutional: He appears well-developed and well-nourished. No distress.  HENT:  Head: Normocephalic and atraumatic.  Nose: Nose normal.  Mouth/Throat: Oropharynx is clear and moist.  Eyes: Pupils are equal, round, and reactive to light. Conjunctivae are normal. Right eye exhibits no discharge. Left eye exhibits no discharge.  Neck: Normal range of motion. No tracheal deviation present.  Cardiovascular: Normal rate, regular rhythm, normal heart sounds and intact distal pulses. Exam reveals no gallop and no friction rub.  No murmur heard. No LE edema  Pulmonary/Chest: Effort normal and breath sounds normal. No stridor. No respiratory distress. He has no wheezes. He has no rales.  Abdominal: Soft. Bowel sounds are normal. He exhibits no distension. There is no tenderness.  Musculoskeletal: He exhibits no edema or deformity.  Decreased left shoulder ROM  Neurological: He is alert. He exhibits normal muscle tone. Coordination normal.  Skin: Skin is warm and dry. Capillary refill takes less than 2 seconds. No rash noted. He is not diaphoretic.  Psychiatric: He has a normal mood and affect. His behavior is normal.  Nursing note and vitals reviewed.     Advanced Directives 03/28/2018 01/06/2018 05/18/2016  Does Patient Have a Medical Advance Directive? Yes No Yes  Type of Advance Directive - - Healthcare Power  of Attorney  Does patient want to make changes to medical advance directive? No - Patient declined - -  Would patient like information on creating a medical advance directive? - No - Patient declined -    Tobacco Social History   Tobacco Use  Smoking Status Former Smoker  . Last attempt to quit: 03/09/1981  . Years  since quitting: 37.0  Smokeless Tobacco Never Used     Counseling given: Not Answered   Clinical Intake:  Pre-visit preparation completed: No  Pain : No/denies pain     BMI - recorded: 35 Nutritional Status: BMI > 30  Obese Nutritional Risks: None Diabetes: No  How often do you need to have someone help you when you read instructions, pamphlets, or other written materials from your doctor or pharmacy?: 2 - Rarely What is the last grade level you completed in school?: 2 years college  Interpreter Needed?: No  Information entered by :: [employer], LPN  Past Medical History:  Diagnosis Date  . Allergy 2014  . Cancer [facility]) 2015   prostate cancer  . Cataract 2016  . DDD (degenerative disc disease), lumbar 12/24/2014  . Glaucoma   . Hx of colonic polyps 02/19/2011  . Hyperlipidemia 11/07/2014  . Peripheral neuropathy 12/24/2014   Past Surgical History:  Procedure Laterality Date  . COLONOSCOPY    . EYE SURGERY     laser surgery to relieve pressure on eyes- both eyes  . JOINT REPLACEMENT  2011   total shoulder replacement (Right)  . LAPAROTOMY N/A 01/06/2018   Procedure: EXPLORATORY LAPAROTOMY;  Surgeon: Judeth Horn, MD;  Location: [place];  Service: General;  Laterality: N/A;  . LYSIS OF ADHESION N/A 01/06/2018   Procedure: LYSIS OF ADHESION;  Surgeon: Judeth Horn, MD;  Location: [place];  Service: General;  Laterality: N/A;  . PROSTATE SURGERY     2015  . VASECTOMY  1982   Family History  Problem Relation Age of Onset  . Arthritis Mother   . Heart disease Mother   . Hyperlipidemia Mother   . Hypertension Mother   . Stroke Mother   . Vision loss Mother   . Arthritis Father   . Depression Father   . Heart disease Father   . Hyperlipidemia Father   . Stroke Father   . Arthritis Sister   . Cancer Sister   . Diabetes Sister   . Hyperlipidemia Sister   . Arthritis Brother   . Diabetes Brother   . Heart disease Brother   . Hyperlipidemia Brother   . Hypertension Brother    . Stomach cancer Maternal Grandfather   . Colon cancer Neg Hx   . Esophageal cancer Neg Hx   . Rectal cancer Neg Hx    Social History   Socioeconomic History  . Marital status: Married    Spouse name: Not on file  . Number of children: Not on file  . Years of education: Not on file  . Highest education level: Not on file  Occupational History  . Not on file  Social Needs  . Financial resource strain: Not on file  . Food insecurity:    Worry: Not on file    Inability: Not on file  . Transportation needs:    Medical: Not on file    Non-medical: Not on file  Tobacco Use  . Smoking status: Former Smoker    Last attempt to quit: 03/09/1981    Years since quitting: 37.0  . Smokeless tobacco: Never Used  Substance and Sexual Activity  . Alcohol use: Yes    Alcohol/week: 2.0 standard drinks    Types: 2 Cans of beer per week    Comment: occasionally  . Drug use: No  . Sexual activity: Not Currently  Lifestyle  . Physical activity:    Days per week: Not on file    Minutes per session: Not on file  . Stress: Not on file  Relationships  . Social connections:    Talks on phone: Not on file    Gets together: Not on file    Attends religious service: Not on file    Active member of club or organization: Not on file    Attends meetings of clubs or organizations: Not on file    Relationship status: Not on file  Other Topics Concern  . Not on file  Social History Narrative  . Not on file    Outpatient Encounter Medications as of 03/28/2018  Medication Sig  . aspirin (ASPIR-81) 81 MG EC tablet Take 81 mg by mouth daily. Swallow whole.  . Brinzolamide-Brimonidine (SIMBRINZA) 1-0.2 % SUSP Place 1 drop into both eyes 2 (two) times daily.   Marland Kitchen gabapentin (NEURONTIN) 600 MG tablet Take 1 tablet (600 mg total) by mouth 2 (two) times daily.  Marland Kitchen latanoprost (XALATAN) 0.005 % ophthalmic solution Place 1 drop into both eyes at bedtime.   . meloxicam (MOBIC) 7.5 MG tablet Take 1 tablet  (7.5 mg total) daily by mouth.  Mckinley Jewel Dimesylate (RHOPRESSA) 0.02 % SOLN Place 1 drop into both eyes daily at 12 noon.   . ONE DAILY MULTIPLE VITAMIN PO Take 1 tablet by mouth daily at 12 noon.   . simvastatin (ZOCOR) 20 MG tablet Take 1 tablet (20 mg total) by mouth daily.  . traZODone (DESYREL) 50 MG tablet Take 0.5-1 tablets (25-50 mg total) at bedtime as needed by mouth for sleep.   No facility-administered encounter medications on file as of 03/28/2018.     Activities of Daily Living In your present state of health, do you have any difficulty performing the following activities: 03/28/2018 01/04/2018  Hearing? N N  Vision? N N  Difficulty concentrating or making decisions? N N  Walking or climbing stairs? N N  Dressing or bathing? N N  Doing errands, shopping? N N  Preparing Food and eating ? N -  Using the Toilet? N -  In the past six months, have you accidently leaked urine? N -  Do you have problems with loss of bowel control? N -  Managing your Medications? N -  Managing your Finances? N -  Housekeeping or managing your Housekeeping? N -  Some recent data might be hidden    Patient Care Team: Rennis Golden as PCP - General (Physician Assistant)   Assessment:   This is a routine wellness examination for [place].    Exercise Activities and Dietary recommendations Current Exercise Habits: Home exercise routine, Time (Minutes): 60, Frequency (Times/Week): 2, Weekly Exercise (Minutes/Week): 120, Intensity: Moderate, Exercise limited by: orthopedic condition(s)  Goals   None   Goal - trying to go back to the gym - treadmill, some machines   Fall Risk Fall Risk  03/28/2018 04/21/2017 03/23/2017 04/22/2016 03/17/2016  Falls in the past year? 0 No No No No  Comment - Emmi Telephone Survey: data to providers prior to load - Emmi Telephone Survey: data to providers prior to load -  Follow up Falls evaluation completed - - - -  Is the patient's home free of loose  throw rugs in walkways, pet beds, electrical cords, etc?   yes      Grab bars in the bathroom? yes      Handrails on the stairs?   yes      Adequate lighting?   yes   Depression Screen PHQ 2/9 Scores 03/28/2018 03/23/2017 08/31/2016 03/17/2016  PHQ - 2 Score 0 0 0 0  PHQ- 9 Score - 0 0 -     Office Visit from 03/28/2018 in [place]  AUDIT-C Score  1       Cognitive Function Cognitive Testing   Alert? Yes  Normal Appearance?Yes  Oriented to person? Yes  Place? Yes  Time? Yes  Recall of three objects? Yes  Can perform simple calculations? Yes  Displays appropriate judgment?Yes  Can read the correct time from a watch face?Yes          Immunization History  Administered Date(s) Administered  . Influenza, High Dose Seasonal PF 02/23/2016, 01/05/2017  . Influenza,inj,Quad PF,6+ Mos 03/10/2015, 02/08/2018  . Pneumococcal Conjugate-13 11/07/2014, 01/05/2017  . Pneumococcal Polysaccharide-23 03/17/2016  . Tdap 03/11/2015    Qualifies for Shingles Vaccine? Did shingles in the past  Will check at pharmacy for Shingrix coverage   Screening Tests Health Maintenance  Topic Date Due  . Hepatitis C Screening  12/28/1950  . TETANUS/TDAP  03/10/2025  . COLONOSCOPY  06/01/2026  . INFLUENZA VACCINE  Completed  . PNA vac Low Risk Adult  Completed   Cancer Screenings: Lung: Low Dose CT Chest recommended if Age 53-80 years, 30 pack-year currently smoking OR have quit w/in 15years. Patient does not qualify. Colorectal: done  Additional Screenings:  Hepatitis C Screening:  - done today       Plan:   67 y/o male here for [place] - is due for refills soon on most meds, routine labs/fasting done today  Problem List Items Addressed This Visit      Nervous and Auditory   Peripheral neuropathy     Other   Hyperlipidemia - Primary    FLP today No SE with simvastatin Continue med Work on diet changes for cholesterol, increase exercise      Relevant Orders     CBC with Differential/Platelet   COMPLETE METABOLIC PANEL WITH GFR   Lipid panel   Chronic insomnia   Class 2 obesity with body mass index (BMI) of 35.0 to 35.9 in adult   Relevant Orders   CBC with Differential/Platelet   COMPLETE METABOLIC PANEL WITH GFR   Lipid panel    Other Visit Diagnoses    Encounter for Medicare annual wellness exam       Screening for diabetes mellitus       check fasting glucose level, no hx of pre-diabetes, no past A1C in chart that I can find, risk of HLD and obesity + family hx of DM   Relevant Orders   COMPLETE METABOLIC PANEL WITH GFR   Need for hepatitis C screening test       agrees to screening   Relevant Orders   Hepatitis C antibody   Need for shingles vaccine       shingrix sent to pharmacy   Relevant Medications   Zoster Vaccine Adjuvanted [facility]) injection      I have personally reviewed and noted the following in the patient's chart:   . Medical and social history . Use of alcohol, tobacco or illicit drugs  . Current medications  and supplements . Functional ability and status . Nutritional status . Physical activity . Advanced directives . List of other physicians . Hospitalizations, surgeries, and ER visits in previous 12 months . Vitals . Screenings to include cognitive, depression, and falls . Referrals and appointments  In addition, I have reviewed and discussed with patient certain preventive protocols, quality metrics, and best practice recommendations. A written personalized care plan for preventive services as well as general preventive health recommendations were provided to patient.     Delsa Grana, PA-C  03/28/2018

## 2018-03-29 ENCOUNTER — Other Ambulatory Visit: Payer: Self-pay | Admitting: *Deleted

## 2018-03-29 DIAGNOSIS — E785 Hyperlipidemia, unspecified: Secondary | ICD-10-CM

## 2018-03-29 LAB — CBC WITH DIFFERENTIAL/PLATELET
BASOS ABS: 43 {cells}/uL (ref 0–200)
Basophils Relative: 0.7 %
EOS ABS: 140 {cells}/uL (ref 15–500)
Eosinophils Relative: 2.3 %
HEMATOCRIT: 46.3 % (ref 38.5–50.0)
HEMOGLOBIN: 15.9 g/dL (ref 13.2–17.1)
LYMPHS ABS: 2062 {cells}/uL (ref 850–3900)
MCH: 29.6 pg (ref 27.0–33.0)
MCHC: 34.3 g/dL (ref 32.0–36.0)
MCV: 86.2 fL (ref 80.0–100.0)
MONOS PCT: 10.5 %
MPV: 11 fL (ref 7.5–12.5)
NEUTROS PCT: 52.7 %
Neutro Abs: 3215 cells/uL (ref 1500–7800)
Platelets: 242 10*3/uL (ref 140–400)
RBC: 5.37 10*6/uL (ref 4.20–5.80)
RDW: 13.4 % (ref 11.0–15.0)
Total Lymphocyte: 33.8 %
WBC mixed population: 641 cells/uL (ref 200–950)
WBC: 6.1 10*3/uL (ref 3.8–10.8)

## 2018-03-29 LAB — COMPLETE METABOLIC PANEL WITH GFR
AG Ratio: 1.9 (calc) (ref 1.0–2.5)
ALT: 22 U/L (ref 9–46)
AST: 19 U/L (ref 10–35)
Albumin: 4.8 g/dL (ref 3.6–5.1)
Alkaline phosphatase (APISO): 86 U/L (ref 40–115)
BUN: 16 mg/dL (ref 7–25)
CALCIUM: 10.1 mg/dL (ref 8.6–10.3)
CO2: 26 mmol/L (ref 20–32)
CREATININE: 0.82 mg/dL (ref 0.70–1.25)
Chloride: 103 mmol/L (ref 98–110)
GFR, EST NON AFRICAN AMERICAN: 91 mL/min/{1.73_m2} (ref >=60)
GFR, Est African American: 105 mL/min/{1.73_m2} (ref >=60)
Globulin: 2.5 g/dL (calc) (ref 1.9–3.7)
Glucose, Bld: 79 mg/dL (ref 65–99)
POTASSIUM: 4.7 mmol/L (ref 3.5–5.3)
Sodium: 140 mmol/L (ref 135–146)
Total Bilirubin: 0.4 mg/dL (ref 0.2–1.2)
Total Protein: 7.3 g/dL (ref 6.1–8.1)

## 2018-03-29 LAB — LIPID PANEL
CHOL/HDL RATIO: 4.8 (calc) (ref <5.0)
CHOLESTEROL: 193 mg/dL (ref <200)
HDL: 40 mg/dL — AB (ref >40)
LDL Cholesterol (Calc): 129 mg/dL (calc) — ABNORMAL HIGH
NON-HDL CHOLESTEROL (CALC): 153 mg/dL — AB (ref <130)
Triglycerides: 126 mg/dL (ref <150)

## 2018-03-29 LAB — HEPATITIS C ANTIBODY
Hepatitis C Ab: NONREACTIVE
SIGNAL TO CUT-OFF: 0.01 (ref <1.00)

## 2018-03-29 MED ORDER — SIMVASTATIN 40 MG PO TABS: 40.0000 mg | ORAL_TABLET | Freq: Every day | ORAL | 3 refills | Status: DC

## 2018-03-29 NOTE — Addendum Note (Signed)
Addended by: Delsa Grana on: 03/29/2018 01:48 PM   Modules accepted: Orders

## 2018-04-03 ENCOUNTER — Other Ambulatory Visit: Payer: Self-pay

## 2018-04-03 ENCOUNTER — Encounter: Payer: Self-pay | Admitting: Family Medicine

## 2018-04-04 NOTE — Telephone Encounter (Signed)
Sandy sorry - I sent the pt the message

## 2018-04-19 DIAGNOSIS — N5231 Erectile dysfunction following radical prostatectomy: Secondary | ICD-10-CM | POA: Diagnosis not present

## 2018-04-19 DIAGNOSIS — C61 Malignant neoplasm of prostate: Secondary | ICD-10-CM | POA: Diagnosis not present

## 2018-05-03 DIAGNOSIS — M9903 Segmental and somatic dysfunction of lumbar region: Secondary | ICD-10-CM | POA: Diagnosis not present

## 2018-05-03 DIAGNOSIS — M5432 Sciatica, left side: Secondary | ICD-10-CM | POA: Diagnosis not present

## 2018-05-29 ENCOUNTER — Encounter: Payer: Self-pay | Admitting: Family Medicine

## 2018-05-30 MED ORDER — TRAZODONE HCL 50 MG PO TABS: 25.0000 mg | ORAL_TABLET | Freq: Every evening | ORAL | 3 refills | Status: DC | PRN

## 2018-06-05 DIAGNOSIS — M19012 Primary osteoarthritis, left shoulder: Secondary | ICD-10-CM | POA: Diagnosis not present

## 2018-06-26 ENCOUNTER — Other Ambulatory Visit: Payer: Medicare Other

## 2018-06-26 DIAGNOSIS — E785 Hyperlipidemia, unspecified: Secondary | ICD-10-CM | POA: Diagnosis not present

## 2018-06-27 ENCOUNTER — Other Ambulatory Visit: Payer: Self-pay | Admitting: Family Medicine

## 2018-06-27 ENCOUNTER — Other Ambulatory Visit: Payer: Self-pay

## 2018-06-27 ENCOUNTER — Encounter: Payer: Self-pay | Admitting: Family Medicine

## 2018-06-27 DIAGNOSIS — E782 Mixed hyperlipidemia: Secondary | ICD-10-CM

## 2018-06-27 LAB — COMPLETE METABOLIC PANEL WITH GFR
AG RATIO: 1.7 (calc) (ref 1.0–2.5)
ALBUMIN MSPROF: 4.2 g/dL (ref 3.6–5.1)
ALKALINE PHOSPHATASE (APISO): 76 U/L (ref 35–144)
ALT: 21 U/L (ref 9–46)
AST: 19 U/L (ref 10–35)
BILIRUBIN TOTAL: 0.3 mg/dL (ref 0.2–1.2)
BUN: 16 mg/dL (ref 7–25)
CHLORIDE: 104 mmol/L (ref 98–110)
CO2: 30 mmol/L (ref 20–32)
Calcium: 9.8 mg/dL (ref 8.6–10.3)
Creat: 0.98 mg/dL (ref 0.70–1.25)
GFR, Est African American: 91 mL/min/{1.73_m2} (ref >=60)
GFR, Est Non African American: 79 mL/min/{1.73_m2} (ref >=60)
GLOBULIN: 2.5 g/dL (ref 1.9–3.7)
Glucose, Bld: 94 mg/dL (ref 65–99)
POTASSIUM: 4.8 mmol/L (ref 3.5–5.3)
SODIUM: 141 mmol/L (ref 135–146)
Total Protein: 6.7 g/dL (ref 6.1–8.1)

## 2018-06-27 LAB — LIPID PANEL
CHOL/HDL RATIO: 5 (calc) — AB (ref <5.0)
CHOLESTEROL: 164 mg/dL (ref <200)
HDL: 33 mg/dL — ABNORMAL LOW (ref >=40)
LDL CHOLESTEROL (CALC): 103 mg/dL — AB
Non-HDL Cholesterol (Calc): 131 mg/dL (calc) — ABNORMAL HIGH (ref <130)
TRIGLYCERIDES: 162 mg/dL — AB (ref <150)

## 2018-06-27 MED ORDER — ATORVASTATIN CALCIUM 40 MG PO TABS: 40.0000 mg | ORAL_TABLET | Freq: Every day | ORAL | 2 refills | Status: DC

## 2018-07-10 DIAGNOSIS — H401222 Low-tension glaucoma, left eye, moderate stage: Secondary | ICD-10-CM | POA: Diagnosis not present

## 2018-09-25 DIAGNOSIS — M19012 Primary osteoarthritis, left shoulder: Secondary | ICD-10-CM | POA: Diagnosis not present

## 2018-09-26 ENCOUNTER — Other Ambulatory Visit: Payer: Self-pay | Admitting: Family Medicine

## 2018-09-26 ENCOUNTER — Ambulatory Visit: Payer: Self-pay | Admitting: Family Medicine

## 2018-09-26 DIAGNOSIS — Z131 Encounter for screening for diabetes mellitus: Secondary | ICD-10-CM

## 2018-09-26 DIAGNOSIS — E669 Obesity, unspecified: Secondary | ICD-10-CM

## 2018-09-26 DIAGNOSIS — R739 Hyperglycemia, unspecified: Secondary | ICD-10-CM

## 2018-09-26 DIAGNOSIS — E782 Mixed hyperlipidemia: Secondary | ICD-10-CM

## 2018-09-26 NOTE — Progress Notes (Signed)
Patient ID: Kyle Wilcox, male    DOB: Oct 14, 1950, 68 y.o.   MRN: [id number]  PCP: Delsa Grana, PA-C  Chief Complaint  Patient presents with  . Hyperlipidemia    Subjective:   Kyle Wilcox is a 68 y.o. male, presents to clinic with CC of routine f/up for HLD, insomnia, chronic pain and is due for repeat fasting labs and needs some med refills.  Pt reports he is taking statin pill in the afternoon (switched from with his am meds), tolerating 40 mg dose increase without any side effects.  He hasn't been able to exercise or eat as well as he was hoping (due to COVID-19 etc).    Left shoulder hurting still, just got a steroid shot from ortho  Also just got second shingles shot.  His immunizations were reconciled to chart.    Patient Active Problem List   Diagnosis Date Noted  . Overweight (BMI 25.0-29.9) 03/28/2018  . Abdominal distention 01/04/2018  . Left shoulder pain 09/19/2017  . Plantar fasciitis of right foot 11/17/2016  . Chronic insomnia 10/18/2016  . Male impotence 12/24/2014  . DDD (degenerative disc disease), lumbar 12/24/2014  . Disorder of rotator cuff 12/24/2014  . Peripheral neuropathy 12/24/2014  . Glaucoma 11/07/2014  . Hyperlipidemia 11/07/2014  . History of prostate cancer 11/07/2014  . Hx of colonic polyps 02/19/2011    Current Meds  Medication Sig  . aspirin (ASPIR-81) 81 MG EC tablet Take 81 mg by mouth daily. Swallow whole.  Marland Kitchen atorvastatin (LIPITOR) 40 MG tablet Take 1 tablet (40 mg total) by mouth daily at 6 PM.  . Latanoprostene Bunod (VYZULTA) 0.024 % SOLN Apply to eye.  Mckinley Jewel Dimesylate (RHOPRESSA) 0.02 % SOLN Place 1 drop into both eyes daily at 12 noon.   . ONE DAILY MULTIPLE VITAMIN PO Take 1 tablet by mouth daily at 12 noon.   . traZODone (DESYREL) 50 MG tablet Take 0.5-1 tablets (25-50 mg total) by mouth at bedtime as needed for sleep.     Review of Systems  Constitutional: Negative.   HENT: Negative.   Eyes: Negative.    Respiratory: Negative.   Cardiovascular: Negative.   Gastrointestinal: Negative.   Endocrine: Negative.   Genitourinary: Negative.   Musculoskeletal: Negative.   Skin: Negative.   Allergic/Immunologic: Negative.   Neurological: Negative.   Hematological: Negative.   Psychiatric/Behavioral: Negative.   All other systems reviewed and are negative.      Objective:    Vitals:   09/27/18 0905  BP: 132/68  Pulse: (!) 54  Resp: 18  Temp: 98.3 F (36.8 C)  SpO2: 99%  Weight: 184 lb (83.5 kg)  Height: 5\' 9"  (1.753 m)      Physical Exam Vitals signs and nursing note reviewed.  Constitutional:      General: He is not in acute distress.    Appearance: Normal appearance. He is well-developed. He is not ill-appearing, toxic-appearing or diaphoretic.     Interventions: Face mask in place.  HENT:     Head: Normocephalic and atraumatic.     Right Ear: External ear normal.     Left Ear: External ear normal.  Eyes:     General: Lids are normal. No scleral icterus.    Conjunctiva/sclera: Conjunctivae normal.     Pupils: Pupils are equal, round, and reactive to light.  Neck:     Musculoskeletal: Normal range of motion and neck supple.     Trachea: Trachea and phonation normal. No tracheal deviation.  Cardiovascular:     Rate and Rhythm: Regular rhythm. Bradycardia present.     Pulses: Normal pulses.          Radial pulses are 2+ on the right side and 2+ on the left side.       Posterior tibial pulses are 2+ on the right side and 2+ on the left side.     Heart sounds: Normal heart sounds. No murmur. No friction rub. No gallop.   Pulmonary:     Effort: Pulmonary effort is normal. No respiratory distress.     Breath sounds: Normal breath sounds. No wheezing, rhonchi or rales.  Abdominal:     General: Bowel sounds are normal. There is no distension.     Palpations: Abdomen is soft.     Tenderness: There is no abdominal tenderness. There is no guarding or rebound.  Skin:     General: Skin is warm and dry.     Capillary Refill: Capillary refill takes less than 2 seconds.     Coloration: Skin is not jaundiced.     Findings: No rash.  Neurological:     Mental Status: He is alert.     Gait: Gait normal.  Psychiatric:        Mood and Affect: Mood normal.        Speech: Speech normal.        Behavior: Behavior normal. Behavior is cooperative.           Assessment & Plan:   Pt is a 68 y/o male here for f/up on chronic conditions, recheck of fasting labs after med changes for HLD.    Problem List Items Addressed This Visit      Nervous and Auditory   Peripheral neuropathy    Compliant with gabapentin, no adverse SE, meds refilled      Relevant Medications   gabapentin (NEURONTIN) 600 MG tablet     Musculoskeletal and Integument   DDD (degenerative disc disease), lumbar    Refill on mobic for chronic joint pain - does continue to see ortho      Relevant Medications   meloxicam (MOBIC) 7.5 MG tablet     Other   Hyperlipidemia - Primary    Compliant with meds, no SE CMP and FLP today If cholesterol improved, will continue same dose Pt to follow up in 3-4 months for MWV, asked to check out, but he did not and did not get AVS or schedule appt - will mail his AVS to him      Chronic insomnia    Well managed with gabapentin and trazodone      Relevant Medications   gabapentin (NEURONTIN) 600 MG tablet   Overweight (BMI 25.0-29.9)    Pt had incorrectly class 2 obesity on his chart - corrected, BMI 27, pt appears healthy and well proportioned.   Healthy diet and lifestyle encouraged for overall health       Other Visit Diagnoses    Screening for diabetes mellitus       Elevated blood sugar       Elevated triglycerides with high cholesterol          Fasting labs done today - pt overdue for MWV, asked to schedule in the next 3-4 months. I have only seen him since he was discharged from the hospital after SBO, and then his PCP left and I  assumed care, last two visits have addressed cholesterol, and otherwise he has requested med refills a few times.  Screening added due to his lab abnormalities as a reviewed labs into last year.  With elevated triglycerides I wanted to screen for DM as well.  He otherwise appears to have up to date health maintenance.      Delsa Grana, PA-C 09/27/18 1:02 PM

## 2018-09-26 NOTE — Progress Notes (Signed)
Ordered future labs

## 2018-09-26 NOTE — Patient Instructions (Addendum)
Continue to work on decreasing saturated fat and trans fat from diet, increasing vegetables and whole grains, and increasing aerobic exercise- even getting out and walking.    High Cholesterol  High cholesterol is a condition in which the blood has high levels of a white, waxy, fat-like substance (cholesterol). The human body needs small amounts of cholesterol. The liver makes all the cholesterol that the body needs. Extra (excess) cholesterol comes from the food that we eat. Cholesterol is carried from the liver by the blood through the blood vessels. If you have high cholesterol, deposits (plaques) may build up on the walls of your blood vessels (arteries). Plaques make the arteries narrower and stiffer. Cholesterol plaques increase your risk for heart attack and stroke. Work with your health care provider to keep your cholesterol levels in a healthy range. What increases the risk? This condition is more likely to develop in people who:  Eat foods that are high in animal fat (saturated fat) or cholesterol.  Are overweight.  Are not getting enough exercise.  Have a family history of high cholesterol. What are the signs or symptoms? There are no symptoms of this condition. How is this diagnosed? This condition may be diagnosed from the results of a blood test.  If you are older than age 69, your health care provider may check your cholesterol every 4-6 years.  You may be checked more often if you already have high cholesterol or other risk factors for heart disease. The blood test for cholesterol measures:  "Bad" cholesterol (LDL cholesterol). This is the main type of cholesterol that causes heart disease. The desired level for LDL is less than 100.  "Good" cholesterol (HDL cholesterol). This type helps to protect against heart disease by cleaning the arteries and carrying the LDL away. The desired level for HDL is 60 or higher.  Triglycerides. These are fats that the body can store  or burn for energy. The desired number for triglycerides is lower than 150.  Total cholesterol. This is a measure of the total amount of cholesterol in your blood, including LDL cholesterol, HDL cholesterol, and triglycerides. A healthy number is less than 200. How is this treated? This condition is treated with diet changes, lifestyle changes, and medicines. Diet changes  This may include eating more whole grains, fruits, vegetables, nuts, and fish.  This may also include cutting back on red meat and foods that have a lot of added sugar. Lifestyle changes  Changes may include getting at least 40 minutes of aerobic exercise 3 times a week. Aerobic exercises include walking, biking, and swimming. Aerobic exercise along with a healthy diet can help you maintain a healthy weight.  Changes may also include quitting smoking. Medicines  Medicines are usually given if diet and lifestyle changes have failed to reduce your cholesterol to healthy levels.  Your health care provider may prescribe a statin medicine. Statin medicines have been shown to reduce cholesterol, which can reduce the risk of heart disease. Follow these instructions at home: Eating and drinking If told by your health care provider:  Eat chicken (without skin), fish, veal, shellfish, ground [place] breast, and round or loin cuts of red meat.  Do not eat fried foods or fatty meats, such as hot dogs and salami.  Eat plenty of fruits, such as apples.  Eat plenty of vegetables, such as broccoli, potatoes, and carrots.  Eat beans, peas, and lentils.  Eat grains such as barley, rice, couscous, and bulgur wheat.  Eat pasta without  cream sauces.  Use skim or nonfat milk, and eat low-fat or nonfat yogurt and cheeses.  Do not eat or drink whole milk, cream, ice cream, egg yolks, or hard cheeses.  Do not eat stick margarine or tub margarines that contain trans fats (also called partially hydrogenated oils).  Do not eat  saturated tropical oils, such as coconut oil and palm oil.  Do not eat cakes, cookies, crackers, or other baked goods that contain trans fats.  General instructions  Exercise as directed by your health care provider. Increase your activity level with activities such as gardening, walking, and taking the stairs.  Take over-the-counter and prescription medicines only as told by your health care provider.  Do not use any products that contain nicotine or tobacco, such as cigarettes and e-cigarettes. If you need help quitting, ask your health care provider.  Keep all follow-up visits as told by your health care provider. This is important. Contact a health care provider if:  You are struggling to maintain a healthy diet or weight.  You need help to start on an exercise program.  You need help to stop smoking. Get help right away if:  You have chest pain.  You have trouble breathing. This information is not intended to replace advice given to you by your health care provider. Make sure you discuss any questions you have with your health care provider. Document Released: 05/03/2005 Document Revised: 11/29/2015 Document Reviewed: 11/01/2015 Elsevier Interactive Patient Education  [employer].

## 2018-09-27 ENCOUNTER — Ambulatory Visit (INDEPENDENT_AMBULATORY_CARE_PROVIDER_SITE_OTHER): Payer: Medicare Other | Admitting: Family Medicine

## 2018-09-27 ENCOUNTER — Encounter: Payer: Self-pay | Admitting: Family Medicine

## 2018-09-27 ENCOUNTER — Other Ambulatory Visit: Payer: Self-pay

## 2018-09-27 VITALS — BP 132/68 | HR 54 | Temp 98.3°F | Resp 18 | Ht 69.0 in | Wt 184.0 lb

## 2018-09-27 DIAGNOSIS — E782 Mixed hyperlipidemia: Secondary | ICD-10-CM

## 2018-09-27 DIAGNOSIS — F5104 Psychophysiologic insomnia: Secondary | ICD-10-CM

## 2018-09-27 DIAGNOSIS — E663 Overweight: Secondary | ICD-10-CM

## 2018-09-27 DIAGNOSIS — E669 Obesity, unspecified: Secondary | ICD-10-CM | POA: Diagnosis not present

## 2018-09-27 DIAGNOSIS — R739 Hyperglycemia, unspecified: Secondary | ICD-10-CM | POA: Diagnosis not present

## 2018-09-27 DIAGNOSIS — M5136 Other intervertebral disc degeneration, lumbar region: Secondary | ICD-10-CM | POA: Diagnosis not present

## 2018-09-27 DIAGNOSIS — Z6835 Body mass index (BMI) 35.0-35.9, adult: Secondary | ICD-10-CM | POA: Diagnosis not present

## 2018-09-27 DIAGNOSIS — Z131 Encounter for screening for diabetes mellitus: Secondary | ICD-10-CM | POA: Diagnosis not present

## 2018-09-27 DIAGNOSIS — G6289 Other specified polyneuropathies: Secondary | ICD-10-CM

## 2018-09-27 MED ORDER — GABAPENTIN 600 MG PO TABS: 600.0000 mg | ORAL_TABLET | Freq: Two times a day (BID) | ORAL | 3 refills | Status: DC

## 2018-09-27 MED ORDER — MELOXICAM 7.5 MG PO TABS: 7.5000 mg | ORAL_TABLET | Freq: Every day | ORAL | 3 refills | Status: DC

## 2018-09-27 NOTE — Assessment & Plan Note (Signed)
Refill on mobic for chronic joint pain - does continue to see ortho

## 2018-09-27 NOTE — Assessment & Plan Note (Addendum)
Pt had incorrectly class 2 obesity on his chart - corrected, BMI 27, pt appears healthy and well proportioned.   Healthy diet and lifestyle encouraged for overall health

## 2018-09-27 NOTE — Assessment & Plan Note (Signed)
Compliant with meds, no SE CMP and FLP today If cholesterol improved, will continue same dose Pt to follow up in 3-4 months for MWV, asked to check out, but he did not and did not get AVS or schedule appt - will mail his AVS to him

## 2018-09-27 NOTE — Assessment & Plan Note (Signed)
Well managed with gabapentin and trazodone

## 2018-09-27 NOTE — Assessment & Plan Note (Signed)
Compliant with gabapentin, no adverse SE, meds refilled

## 2018-09-28 DIAGNOSIS — H401222 Low-tension glaucoma, left eye, moderate stage: Secondary | ICD-10-CM | POA: Diagnosis not present

## 2018-09-28 LAB — CBC WITH DIFFERENTIAL/PLATELET
Absolute Monocytes: 1235 cells/uL — ABNORMAL HIGH (ref 200–950)
Basophils Absolute: 26 cells/uL (ref 0–200)
Basophils Relative: 0.3 %
Eosinophils Absolute: 122 cells/uL (ref 15–500)
Eosinophils Relative: 1.4 %
HCT: 46 % (ref 38.5–50.0)
Hemoglobin: 15.6 g/dL (ref 13.2–17.1)
Lymphs Abs: 1253 cells/uL (ref 850–3900)
MCH: 30.4 pg (ref 27.0–33.0)
MCHC: 33.9 g/dL (ref 32.0–36.0)
MCV: 89.5 fL (ref 80.0–100.0)
MPV: 11.4 fL (ref 7.5–12.5)
Monocytes Relative: 14.2 %
Neutro Abs: 6064 cells/uL (ref 1500–7800)
Neutrophils Relative %: 69.7 %
Platelets: 219 10*3/uL (ref 140–400)
RBC: 5.14 10*6/uL (ref 4.20–5.80)
RDW: 12.8 % (ref 11.0–15.0)
Total Lymphocyte: 14.4 %
WBC: 8.7 10*3/uL (ref 3.8–10.8)

## 2018-09-28 LAB — COMPLETE METABOLIC PANEL WITH GFR
AG Ratio: 1.8 (calc) (ref 1.0–2.5)
ALT: 21 U/L (ref 9–46)
AST: 21 U/L (ref 10–35)
Albumin: 4.6 g/dL (ref 3.6–5.1)
Alkaline phosphatase (APISO): 91 U/L (ref 35–144)
BUN: 17 mg/dL (ref 7–25)
CO2: 28 mmol/L (ref 20–32)
Calcium: 10.1 mg/dL (ref 8.6–10.3)
Chloride: 104 mmol/L (ref 98–110)
Creat: 1.01 mg/dL (ref 0.70–1.25)
GFR, Est African American: 88 mL/min/{1.73_m2} (ref >=60)
GFR, Est Non African American: 76 mL/min/{1.73_m2} (ref >=60)
Globulin: 2.6 g/dL (calc) (ref 1.9–3.7)
Glucose, Bld: 88 mg/dL (ref 65–99)
Potassium: 5 mmol/L (ref 3.5–5.3)
Sodium: 143 mmol/L (ref 135–146)
Total Bilirubin: 0.7 mg/dL (ref 0.2–1.2)
Total Protein: 7.2 g/dL (ref 6.1–8.1)

## 2018-09-28 LAB — HEMOGLOBIN A1C
Hgb A1c MFr Bld: 5.5 % of total Hgb (ref <5.7)
Mean Plasma Glucose: 111 (calc)
eAG (mmol/L): 6.2 (calc)

## 2018-09-28 LAB — LIPID PANEL
Cholesterol: 141 mg/dL (ref <200)
HDL: 37 mg/dL — ABNORMAL LOW (ref >=40)
LDL Cholesterol (Calc): 82 mg/dL (calc)
Non-HDL Cholesterol (Calc): 104 mg/dL (calc) (ref <130)
Total CHOL/HDL Ratio: 3.8 (calc) (ref <5.0)
Triglycerides: 122 mg/dL (ref <150)

## 2018-12-25 DIAGNOSIS — M19012 Primary osteoarthritis, left shoulder: Secondary | ICD-10-CM | POA: Diagnosis not present

## 2019-01-08 DIAGNOSIS — L820 Inflamed seborrheic keratosis: Secondary | ICD-10-CM | POA: Diagnosis not present

## 2019-01-31 DIAGNOSIS — H401213 Low-tension glaucoma, right eye, severe stage: Secondary | ICD-10-CM | POA: Diagnosis not present

## 2019-01-31 DIAGNOSIS — H02839 Dermatochalasis of unspecified eye, unspecified eyelid: Secondary | ICD-10-CM | POA: Diagnosis not present

## 2019-02-07 ENCOUNTER — Other Ambulatory Visit: Payer: Self-pay

## 2019-02-07 ENCOUNTER — Ambulatory Visit: Payer: Self-pay | Admitting: Family Medicine

## 2019-02-08 ENCOUNTER — Encounter: Payer: Self-pay | Admitting: Family Medicine

## 2019-02-08 ENCOUNTER — Ambulatory Visit (INDEPENDENT_AMBULATORY_CARE_PROVIDER_SITE_OTHER): Payer: Medicare Other | Admitting: Family Medicine

## 2019-02-08 VITALS — BP 130/66 | HR 56 | Temp 98.0°F | Resp 16 | Ht 67.5 in | Wt 183.0 lb

## 2019-02-08 DIAGNOSIS — Z8546 Personal history of malignant neoplasm of prostate: Secondary | ICD-10-CM | POA: Diagnosis not present

## 2019-02-08 DIAGNOSIS — Z0000 Encounter for general adult medical examination without abnormal findings: Secondary | ICD-10-CM

## 2019-02-08 DIAGNOSIS — E785 Hyperlipidemia, unspecified: Secondary | ICD-10-CM

## 2019-02-08 DIAGNOSIS — F5104 Psychophysiologic insomnia: Secondary | ICD-10-CM

## 2019-02-08 DIAGNOSIS — Z01818 Encounter for other preprocedural examination: Secondary | ICD-10-CM | POA: Diagnosis not present

## 2019-02-08 DIAGNOSIS — G629 Polyneuropathy, unspecified: Secondary | ICD-10-CM | POA: Diagnosis not present

## 2019-02-08 DIAGNOSIS — H02839 Dermatochalasis of unspecified eye, unspecified eyelid: Secondary | ICD-10-CM | POA: Diagnosis not present

## 2019-02-08 NOTE — Progress Notes (Signed)
Subjective:    Patient ID: Kyle Wilcox, male    DOB: 08-23-1950, 68 y.o.   MRN: [id number]  HPI Patient is a very pleasant 68 year old Caucasian male here today for his Medicare wellness visit.  He denies any specific medical concerns other than some tightness in his distal right hamstring and proximal right gastrocnemius muscle.  He has no pain in the kneecap.  The pain is not elicited by twisting or bending the knee.  He has palpable popliteal and dorsalis pedis and posterior tibialis pulses in the right leg.  There is no erythema or swelling or warmth.  There is no claudication.  Symptoms sound more like stiffness and tightness due to muscle tendons.  Otherwise he is doing well with no concerns.  Specifically he denies any depression, falls, or memory loss.  He does have a remote history of smoking.  He began smoking in the late 60s and quit in the early 80s.  He was screened for AAA in 2017 and this was normal.  He does not have sufficient smoking history to warrant a CT scan of the lungs for lung cancer.  His last colonoscopy was in 2018 and was normal without any polyps or irregularities.  He does have a history of prostate cancer and had his prostate removed.  He is due to check a PSA.  He is getting his eyes checked regularly due to glaucoma.  He does have cataracts that are beginning to develop but they are not causing him any symptoms at the present time.  Otherwise he is doing well with no concerns Immunization History  Administered Date(s) Administered  . Influenza, High Dose Seasonal PF 02/23/2016, 01/05/2017, 01/12/2019  . Influenza,inj,Quad PF,6+ Mos 03/10/2015, 02/08/2018  . Influenza-Unspecified 02/08/2018  . Pneumococcal Conjugate-13 11/07/2014, 01/05/2017  . Pneumococcal Polysaccharide-23 03/17/2016  . Tdap 03/11/2015  . Zoster Recombinat (Shingrix) 05/29/2018, 09/26/2018    Past Medical History:  Diagnosis Date  . Allergy 2014  . Cancer [facility]) 2015   prostate cancer  .  Cataract 2016  . DDD (degenerative disc disease), lumbar 12/24/2014  . Glaucoma   . Hx of colonic polyps 02/19/2011  . Hyperlipidemia 11/07/2014  . Peripheral neuropathy 12/24/2014   Past Surgical History:  Procedure Laterality Date  . COLONOSCOPY    . EYE SURGERY     laser surgery to relieve pressure on eyes- both eyes  . JOINT REPLACEMENT  2011   total shoulder replacement (Right)  . LAPAROTOMY N/A 01/06/2018   Procedure: EXPLORATORY LAPAROTOMY;  Surgeon: Judeth Horn, MD;  Location: [place];  Service: General;  Laterality: N/A;  . LYSIS OF ADHESION N/A 01/06/2018   Procedure: LYSIS OF ADHESION;  Surgeon: Judeth Horn, MD;  Location: [place];  Service: General;  Laterality: N/A;  . PROSTATE SURGERY     2015  . VASECTOMY  1982   Current Outpatient Medications on File Prior to Visit  Medication Sig Dispense Refill  . aspirin (ASPIR-81) 81 MG EC tablet Take 81 mg by mouth daily. Swallow whole.    Marland Kitchen atorvastatin (LIPITOR) 40 MG tablet Take 1 tablet (40 mg total) by mouth daily at 6 PM. 90 tablet 2  . dorzolamide-timolol (COSOPT) 22.3-6.8 MG/ML ophthalmic solution     . gabapentin (NEURONTIN) 600 MG tablet Take 1 tablet (600 mg total) by mouth 2 (two) times daily. 180 tablet 3  . Latanoprostene Bunod (VYZULTA) 0.024 % SOLN Apply to eye.    . meloxicam (MOBIC) 7.5 MG tablet Take 1 tablet (7.5 mg  total) by mouth daily. 90 tablet 3  . ONE DAILY MULTIPLE VITAMIN PO Take 1 tablet by mouth daily at 12 noon.     . vitamin C (ASCORBIC ACID) 500 MG tablet Take 500 mg by mouth daily.    . traZODone (DESYREL) 50 MG tablet Take 0.5-1 tablets (25-50 mg total) by mouth at bedtime as needed for sleep. (Patient not taking: Reported on 02/08/2019) 90 tablet 3   No current facility-administered medications on file prior to visit.    No Known Allergies Social History   Socioeconomic History  . Marital status: Married    Spouse name: Not on file  . Number of children: Not on file  . Years of education: Not on  file  . Highest education level: Not on file  Occupational History  . Not on file  Social Needs  . Financial resource strain: Not on file  . Food insecurity    Worry: Not on file    Inability: Not on file  . Transportation needs    Medical: Not on file    Non-medical: Not on file  Tobacco Use  . Smoking status: Former Smoker    Quit date: 03/09/1981    Years since quitting: 37.9  . Smokeless tobacco: Never Used  Substance and Sexual Activity  . Alcohol use: Yes    Alcohol/week: 2.0 standard drinks    Types: 2 Cans of beer per week    Comment: occasionally  . Drug use: No  . Sexual activity: Not Currently  Lifestyle  . Physical activity    Days per week: Not on file    Minutes per session: Not on file  . Stress: Not on file  Relationships  . Social Herbalist on phone: Not on file    Gets together: Not on file    Attends religious service: Not on file    Active member of club or organization: Not on file    Attends meetings of clubs or organizations: Not on file    Relationship status: Not on file  . Intimate partner violence    Fear of current or ex partner: Not on file    Emotionally abused: Not on file    Physically abused: Not on file    Forced sexual activity: Not on file  Other Topics Concern  . Not on file  Social History Narrative  . Not on file   Family History  Problem Relation Age of Onset  . Arthritis Mother   . Heart disease Mother   . Hyperlipidemia Mother   . Hypertension Mother   . Stroke Mother   . Vision loss Mother   . Arthritis Father   . Depression Father   . Heart disease Father   . Hyperlipidemia Father   . Stroke Father   . Arthritis Sister   . Cancer Sister   . Diabetes Sister   . Hyperlipidemia Sister   . Arthritis Brother   . Diabetes Brother   . Heart disease Brother   . Hyperlipidemia Brother   . Hypertension Brother   . Stomach cancer Maternal Grandfather   . Colon cancer Neg Hx   . Esophageal cancer Neg Hx    . Rectal cancer Neg Hx      Review of Systems  All other systems reviewed and are negative.      Objective:   Physical Exam Vitals signs reviewed.  Constitutional:      General: He is not in acute distress.  Appearance: Normal appearance. He is not ill-appearing, toxic-appearing or diaphoretic.  HENT:     Head: Normocephalic and atraumatic.     Right Ear: Tympanic membrane, ear canal and external ear normal.     Left Ear: Tympanic membrane, ear canal and external ear normal.     Nose: Nose normal. No congestion or rhinorrhea.     Mouth/Throat:     Mouth: Mucous membranes are moist.     Pharynx: Oropharynx is clear. No oropharyngeal exudate or posterior oropharyngeal erythema.  Eyes:     General: No scleral icterus.       Right eye: No discharge.        Left eye: No discharge.     Extraocular Movements: Extraocular movements intact.     Conjunctiva/sclera: Conjunctivae normal.     Pupils: Pupils are equal, round, and reactive to light.  Neck:     Musculoskeletal: Normal range of motion and neck supple.     Vascular: No carotid bruit.  Cardiovascular:     Rate and Rhythm: Normal rate and regular rhythm.     Pulses: Normal pulses.     Heart sounds: Normal heart sounds. No murmur. No friction rub. No gallop.   Pulmonary:     Effort: Pulmonary effort is normal. No respiratory distress.     Breath sounds: Normal breath sounds. No stridor. No wheezing, rhonchi or rales.  Chest:     Chest wall: No tenderness.  Abdominal:     General: Abdomen is flat. Bowel sounds are normal. There is no distension.     Palpations: Abdomen is soft. There is no mass.     Tenderness: There is no abdominal tenderness. There is no right CVA tenderness, left CVA tenderness, guarding or rebound.     Hernia: No hernia is present.  Musculoskeletal:     Right lower leg: No edema.     Left lower leg: No edema.  Lymphadenopathy:     Cervical: No cervical adenopathy.  Skin:    General: Skin is  warm.     Coloration: Skin is not jaundiced or pale.     Findings: No bruising, erythema, lesion or rash.  Neurological:     General: No focal deficit present.     Mental Status: He is alert and oriented to person, place, and time. Mental status is at baseline.     Cranial Nerves: No cranial nerve deficit.     Sensory: No sensory deficit.     Motor: No weakness.     Coordination: Coordination normal.     Gait: Gait normal.     Deep Tendon Reflexes: Reflexes normal.  Psychiatric:        Mood and Affect: Mood normal.        Behavior: Behavior normal.        Thought Content: Thought content normal.        Judgment: Judgment normal.           Assessment & Plan:  General medical exam  Hyperlipidemia, unspecified hyperlipidemia type - Plan: CBC with Differential/Platelet, COMPLETE METABOLIC PANEL WITH GFR, Lipid panel  Chronic insomnia  Peripheral polyneuropathy  History of prostate cancer - Plan: PSA, CANCELED: PSA  Patient's physical exam today is completely normal.  His blood pressure is excellent at 130/66.  His colon cancer screening is up-to-date.  I will check a PSA today to screen for prostate cancer.  He is already been checked for a AAA.  He does not meet requirements for lung cancer  screening.  He denies any issues with memory loss, falls, or depression.  I will check a CBC, CMP, and fasting lipid panel.  Otherwise the remainder of his preventative care is up-to-date.  His immunizations are up-to-date.  Regular anticipatory guidance is provided.  I believe the tightness in his distal right hamstring is muscle tendinitis.

## 2019-02-09 ENCOUNTER — Encounter: Payer: Self-pay | Admitting: Family Medicine

## 2019-02-09 LAB — COMPLETE METABOLIC PANEL WITH GFR
AG Ratio: 1.8 (calc) (ref 1.0–2.5)
ALT: 20 U/L (ref 9–46)
AST: 17 U/L (ref 10–35)
Albumin: 4.4 g/dL (ref 3.6–5.1)
Alkaline phosphatase (APISO): 87 U/L (ref 35–144)
BUN: 17 mg/dL (ref 7–25)
CO2: 29 mmol/L (ref 20–32)
Calcium: 10.1 mg/dL (ref 8.6–10.3)
Chloride: 104 mmol/L (ref 98–110)
Creat: 0.94 mg/dL (ref 0.70–1.25)
GFR, Est African American: 95 mL/min/{1.73_m2} (ref >=60)
GFR, Est Non African American: 82 mL/min/{1.73_m2} (ref >=60)
Globulin: 2.5 g/dL (calc) (ref 1.9–3.7)
Glucose, Bld: 89 mg/dL (ref 65–99)
Potassium: 4.8 mmol/L (ref 3.5–5.3)
Sodium: 142 mmol/L (ref 135–146)
Total Bilirubin: 0.4 mg/dL (ref 0.2–1.2)
Total Protein: 6.9 g/dL (ref 6.1–8.1)

## 2019-02-09 LAB — LIPID PANEL
Cholesterol: 167 mg/dL (ref <200)
HDL: 36 mg/dL — ABNORMAL LOW (ref >=40)
LDL Cholesterol (Calc): 101 mg/dL (calc) — ABNORMAL HIGH
Non-HDL Cholesterol (Calc): 131 mg/dL (calc) — ABNORMAL HIGH (ref <130)
Total CHOL/HDL Ratio: 4.6 (calc) (ref <5.0)
Triglycerides: 186 mg/dL — ABNORMAL HIGH (ref <150)

## 2019-02-09 LAB — CBC WITH DIFFERENTIAL/PLATELET
Absolute Monocytes: 693 cells/uL (ref 200–950)
Basophils Absolute: 39 cells/uL (ref 0–200)
Basophils Relative: 0.7 %
Eosinophils Absolute: 204 cells/uL (ref 15–500)
Eosinophils Relative: 3.7 %
HCT: 47.3 % (ref 38.5–50.0)
Hemoglobin: 16.1 g/dL (ref 13.2–17.1)
Lymphs Abs: 1771 cells/uL (ref 850–3900)
MCH: 30.5 pg (ref 27.0–33.0)
MCHC: 34 g/dL (ref 32.0–36.0)
MCV: 89.6 fL (ref 80.0–100.0)
MPV: 11.2 fL (ref 7.5–12.5)
Monocytes Relative: 12.6 %
Neutro Abs: 2794 cells/uL (ref 1500–7800)
Neutrophils Relative %: 50.8 %
Platelets: 220 10*3/uL (ref 140–400)
RBC: 5.28 10*6/uL (ref 4.20–5.80)
RDW: 13.1 % (ref 11.0–15.0)
Total Lymphocyte: 32.2 %
WBC: 5.5 10*3/uL (ref 3.8–10.8)

## 2019-02-09 LAB — PSA: PSA: <0.1 ng/mL (ref <=4.0)

## 2019-02-12 NOTE — Addendum Note (Signed)
Addended by: Jenna Luo T on: 02/12/2019 01:44 PM   Modules accepted: Level of Service

## 2019-03-01 ENCOUNTER — Encounter: Payer: Self-pay | Admitting: Family Medicine

## 2019-03-01 ENCOUNTER — Other Ambulatory Visit: Payer: Self-pay | Admitting: Family Medicine

## 2019-03-01 DIAGNOSIS — E782 Mixed hyperlipidemia: Secondary | ICD-10-CM

## 2019-03-02 DIAGNOSIS — H02834 Dermatochalasis of left upper eyelid: Secondary | ICD-10-CM | POA: Diagnosis not present

## 2019-03-02 DIAGNOSIS — H02831 Dermatochalasis of right upper eyelid: Secondary | ICD-10-CM | POA: Diagnosis not present

## 2019-03-02 MED ORDER — ATORVASTATIN CALCIUM 40 MG PO TABS: 40.0000 mg | ORAL_TABLET | Freq: Every day | ORAL | 2 refills | Status: DC

## 2019-03-26 DIAGNOSIS — M19012 Primary osteoarthritis, left shoulder: Secondary | ICD-10-CM | POA: Diagnosis not present

## 2019-04-23 ENCOUNTER — Ambulatory Visit (INDEPENDENT_AMBULATORY_CARE_PROVIDER_SITE_OTHER): Payer: Medicare Other | Admitting: Family Medicine

## 2019-04-23 ENCOUNTER — Other Ambulatory Visit: Payer: Self-pay

## 2019-04-23 VITALS — BP 130/72 | HR 44 | Temp 96.8°F | Resp 12 | Ht 69.0 in | Wt 183.0 lb

## 2019-04-23 DIAGNOSIS — S39012A Strain of muscle, fascia and tendon of lower back, initial encounter: Secondary | ICD-10-CM

## 2019-04-23 MED ORDER — CYCLOBENZAPRINE HCL 10 MG PO TABS: 10.0000 mg | ORAL_TABLET | Freq: Three times a day (TID) | ORAL | 0 refills | Status: DC | PRN

## 2019-04-23 MED ORDER — PREDNISONE 20 MG PO TABS: ORAL_TABLET | ORAL | 0 refills | Status: DC

## 2019-04-23 NOTE — Progress Notes (Signed)
Subjective:    Patient ID: Kyle Wilcox, male    DOB: Apr 23, 1951, 68 y.o.   MRN: [id number]  HPI  Patient recently saw his chiropractor for a checkup.  After receiving a "adjustment" he developed pain in his lumbar spine.  The pain is located above the gluteus maximus on both sides.  It is a dull pulling aching pain.  He went back to the chiropractor and a repeated adjustment did not help the pain.  The patient presents today for evaluation.  He denies any numbness or tingling in his legs.  He denies any weakness in his legs.  He denies any neuropathic pain radiating down his legs.  He denies any dysuria or hematuria or urgency or frequency.  He denies any urinary retention or hesitancy.  He denies any fevers or chills.  Patient has no tenderness to palpation over the lumbar spinous processes.  He has no tenderness to palpation over the lumbar paraspinal muscles however he is tender to palpation over the superior aspect of the gluteus muscles bilaterally.  Patient has muscle strength 5/5 equal and symmetric in both legs.  He has normal reflexes 2/4 checked at the patella as well as that the Achilles tendon.  He has normal sensation in both legs.  Symptoms are more consistent with a muscle strain Past Medical History:  Diagnosis Date  . Allergy 2014  . Cancer [facility]) 2015   prostate cancer  . Cataract 2016  . DDD (degenerative disc disease), lumbar 12/24/2014  . Glaucoma   . Hx of colonic polyps 02/19/2011  . Hyperlipidemia 11/07/2014  . Peripheral neuropathy 12/24/2014   Past Surgical History:  Procedure Laterality Date  . COLONOSCOPY    . EYE SURGERY     laser surgery to relieve pressure on eyes- both eyes  . JOINT REPLACEMENT  2011   total shoulder replacement (Right)  . LAPAROTOMY N/A 01/06/2018   Procedure: EXPLORATORY LAPAROTOMY;  Surgeon: Judeth Horn, MD;  Location: [place];  Service: General;  Laterality: N/A;  . LYSIS OF ADHESION N/A 01/06/2018   Procedure: LYSIS OF ADHESION;  Surgeon:  Judeth Horn, MD;  Location: [place];  Service: General;  Laterality: N/A;  . PROSTATE SURGERY     2015  . VASECTOMY  1982   Current Outpatient Medications on File Prior to Visit  Medication Sig Dispense Refill  . aspirin (ASPIR-81) 81 MG EC tablet Take 81 mg by mouth daily. Swallow whole.    Marland Kitchen atorvastatin (LIPITOR) 40 MG tablet Take 1 tablet (40 mg total) by mouth daily at 6 PM. 90 tablet 2  . dorzolamide-timolol (COSOPT) 22.3-6.8 MG/ML ophthalmic solution     . gabapentin (NEURONTIN) 600 MG tablet Take 1 tablet (600 mg total) by mouth 2 (two) times daily. 180 tablet 3  . Latanoprostene Bunod (VYZULTA) 0.024 % SOLN Apply to eye.    . meloxicam (MOBIC) 7.5 MG tablet Take 1 tablet (7.5 mg total) by mouth daily. 90 tablet 3  . ONE DAILY MULTIPLE VITAMIN PO Take 1 tablet by mouth daily at 12 noon.     . traZODone (DESYREL) 50 MG tablet Take 0.5-1 tablets (25-50 mg total) by mouth at bedtime as needed for sleep. 90 tablet 3  . vitamin C (ASCORBIC ACID) 500 MG tablet Take 500 mg by mouth daily.     No current facility-administered medications on file prior to visit.    No Known Allergies Social History   Socioeconomic History  . Marital status: Married    Spouse  name: Not on file  . Number of children: Not on file  . Years of education: Not on file  . Highest education level: Not on file  Occupational History  . Not on file  Social Needs  . Financial resource strain: Not on file  . Food insecurity    Worry: Not on file    Inability: Not on file  . Transportation needs    Medical: Not on file    Non-medical: Not on file  Tobacco Use  . Smoking status: Former Smoker    Quit date: 03/09/1981    Years since quitting: 38.1  . Smokeless tobacco: Never Used  Substance and Sexual Activity  . Alcohol use: Yes    Alcohol/week: 2.0 standard drinks    Types: 2 Cans of beer per week    Comment: occasionally  . Drug use: No  . Sexual activity: Not Currently  Lifestyle  . Physical  activity    Days per week: Not on file    Minutes per session: Not on file  . Stress: Not on file  Relationships  . Social Herbalist on phone: Not on file    Gets together: Not on file    Attends religious service: Not on file    Active member of club or organization: Not on file    Attends meetings of clubs or organizations: Not on file    Relationship status: Not on file  . Intimate partner violence    Fear of current or ex partner: Not on file    Emotionally abused: Not on file    Physically abused: Not on file    Forced sexual activity: Not on file  Other Topics Concern  . Not on file  Social History Narrative  . Not on file     Review of Systems  All other systems reviewed and are negative.      Objective:   Physical Exam Vitals signs reviewed.  Cardiovascular:     Rate and Rhythm: Normal rate and regular rhythm.     Heart sounds: Normal heart sounds.  Pulmonary:     Effort: Pulmonary effort is normal.     Breath sounds: Normal breath sounds.  Musculoskeletal:     Lumbar back: He exhibits decreased range of motion and pain. He exhibits no tenderness, no bony tenderness, no swelling, no edema, no deformity and no spasm.       Back:           Assessment & Plan:  Strain of lumbar region, initial encounter  I believe the patient likely has a lumbar muscle strain or strain of the superior portion of the gluteus maximus likely due to stretching.  Recommended trying a prednisone taper pack as an anti-inflammatory with Flexeril 10 mg every 8 hours as needed for muscle spasms.  Patient is already tried and failed meloxicam as well as ibuprofen.

## 2019-04-24 ENCOUNTER — Ambulatory Visit: Payer: Medicare Other | Admitting: Family Medicine

## 2019-04-27 DIAGNOSIS — N5231 Erectile dysfunction following radical prostatectomy: Secondary | ICD-10-CM | POA: Diagnosis not present

## 2019-05-16 IMAGING — DX DG ABDOMEN 1V
1 series · 1 of 1 positions shown · non-contrast
Comparison: 01/05/2018 and 01/04/2018 and CT scan dated 01/04/2018

CLINICAL DATA: Small bowel obstruction.

EXAM:
ABDOMEN - 1 VIEW

[t abdomen supine]
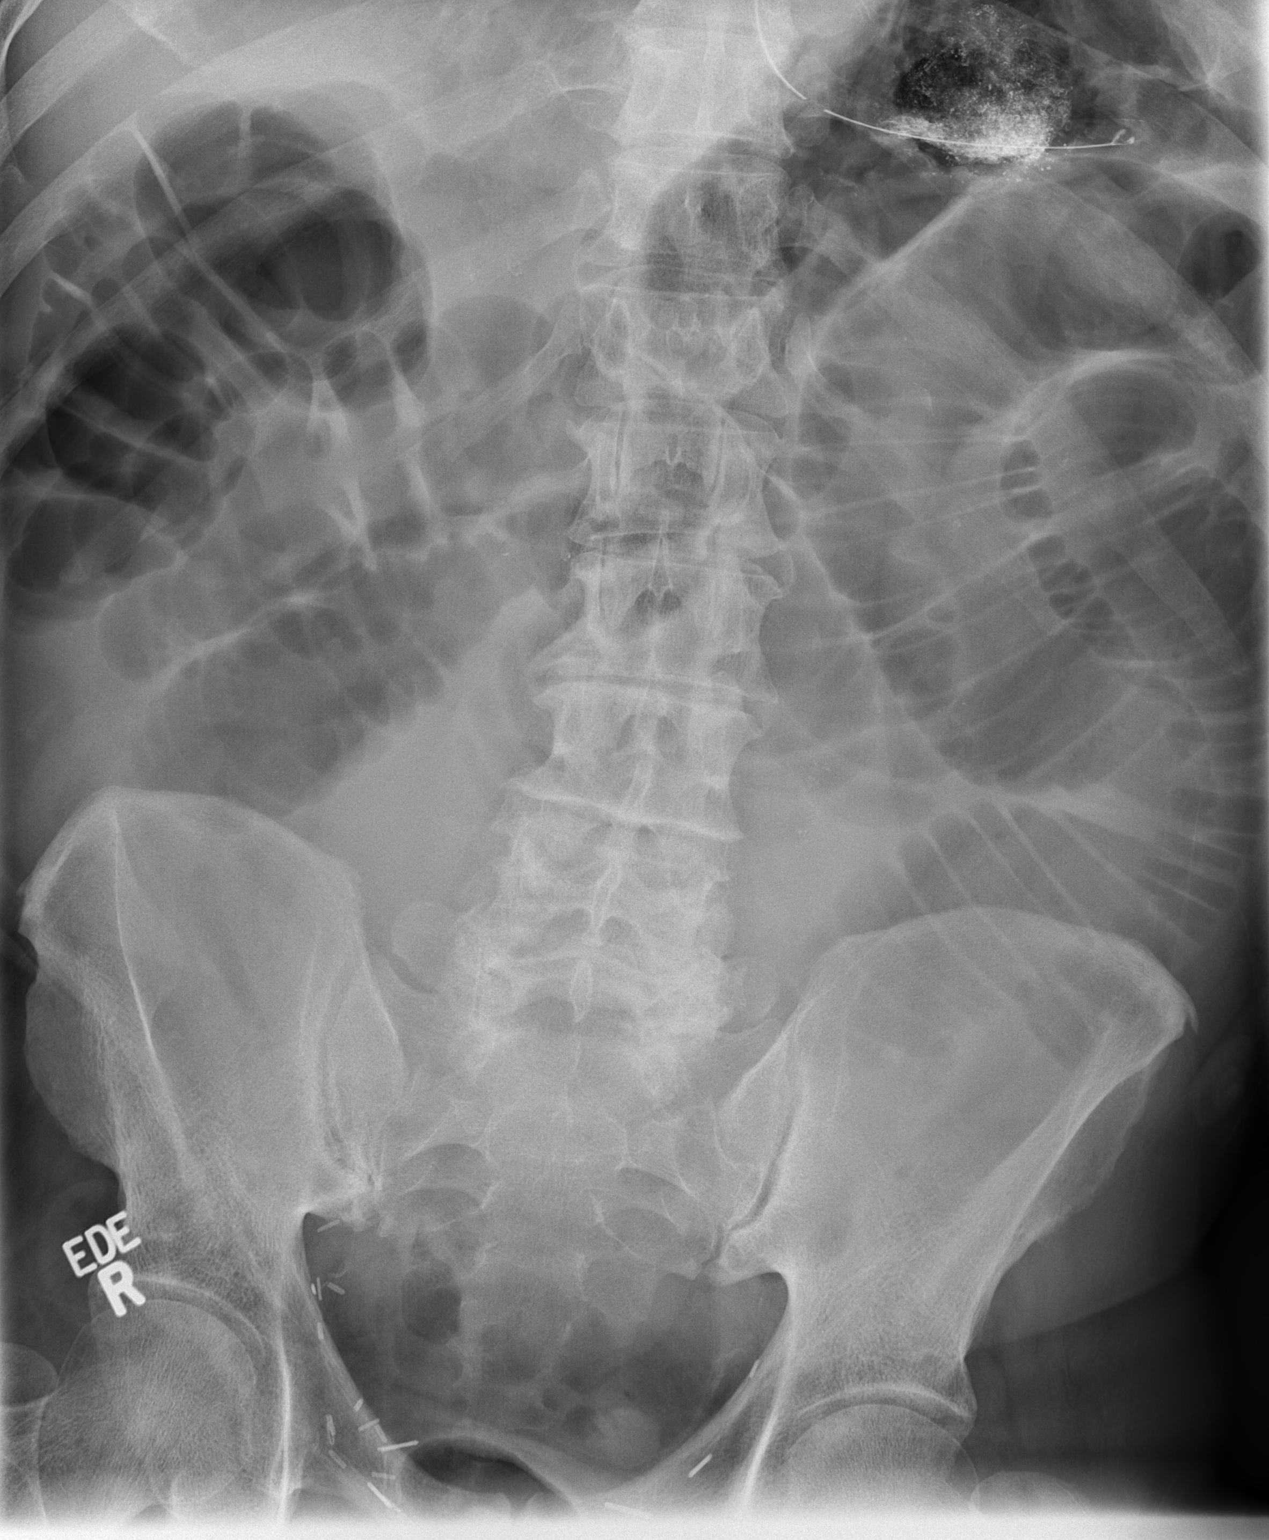

[1 of 1 positions shown; findings below may reference images not displayed]

FINDINGS: There is persistent small bowel obstruction with slightly increased
dilatation of small bowel loops in the left side of the abdomen. NG
tube tip is in the fundus of the stomach. There is gas in the
nondistended colon.

No acute bone abnormality.
IMPRESSION: Progressive small bowel obstruction with increased dilatation of the
small bowel.

## 2019-05-18 DIAGNOSIS — U071 COVID-19: Secondary | ICD-10-CM

## 2019-05-18 HISTORY — DX: COVID-19: U07.1

## 2019-05-18 IMAGING — DX DG ABD PORTABLE 1V
2 series · 2 of 2 positions shown · non-contrast
Comparison: 01/06/2018

CLINICAL DATA: Ileus

EXAM:
PORTABLE ABDOMEN - 1 VIEW

[abdomen kub (1 of 2)]
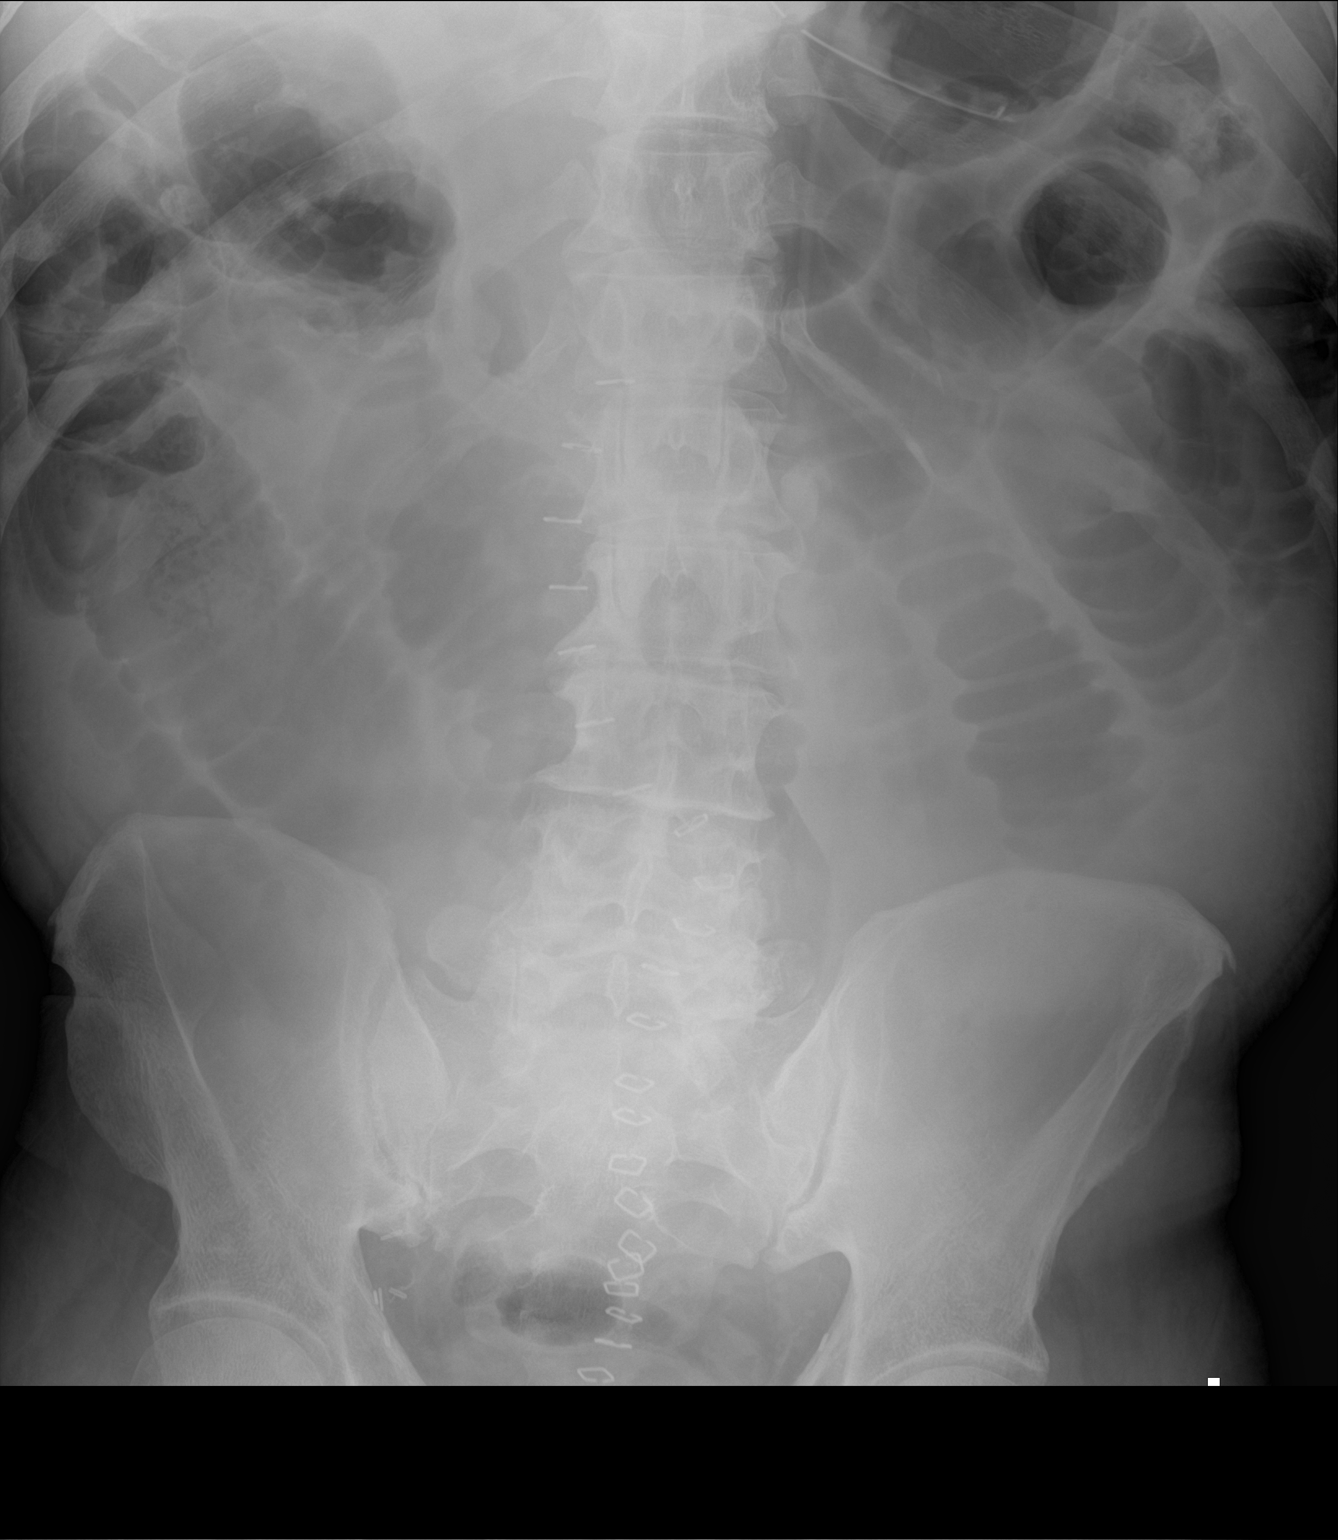

[abdomen kub (2 of 2)]
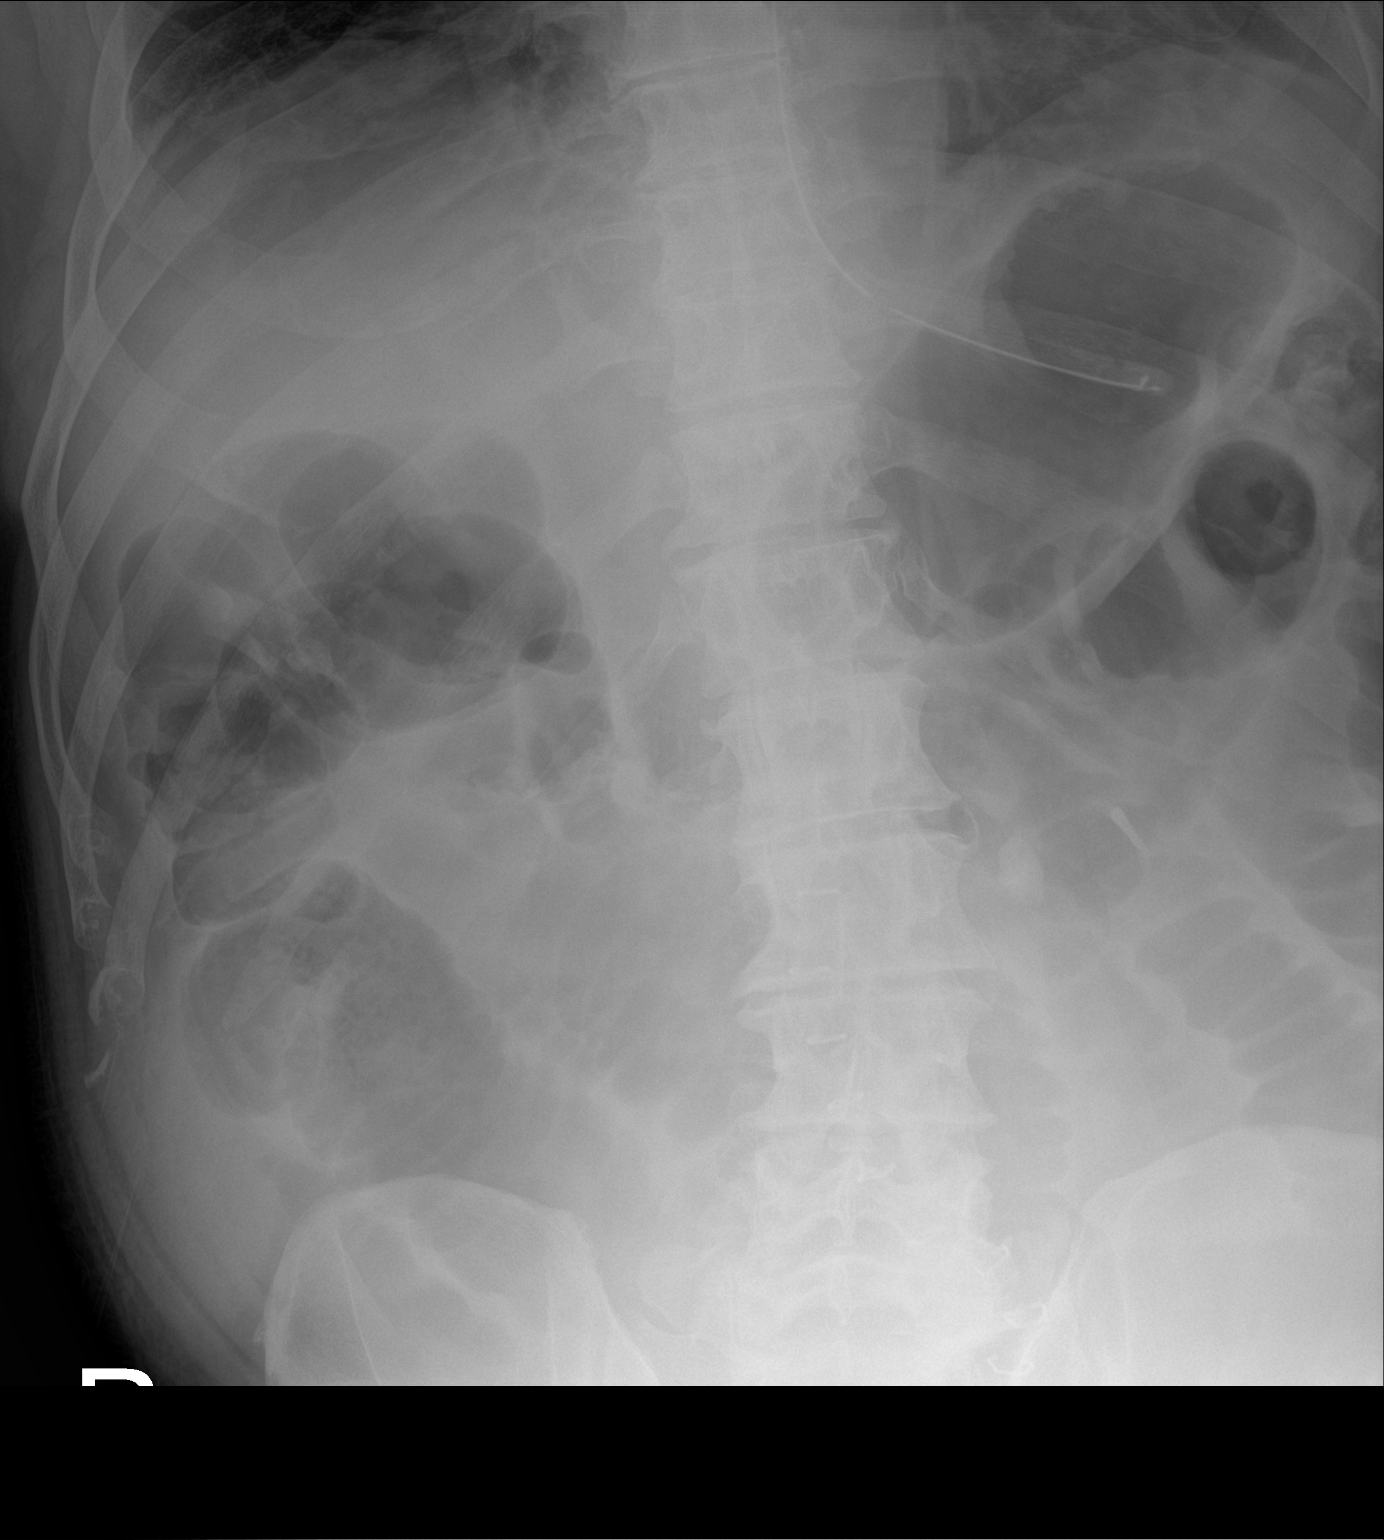

[2 of 2 positions shown; findings below may reference images not displayed]

FINDINGS: Enteric tube terminates in the proximal stomach.

Multiple dilated loops of small bowel in the central abdomen.

Colon is not decompressed.

Skin staples overlying the midline lower abdomen. Surgical clips
overlying the pelvis.
IMPRESSION: Multiple dilated loops of small and large bowel. In the setting of
recent surgery, this is compatible with the reported history of
adynamic ileus. Partial small bowel obstruction is also possible.

## 2019-06-05 ENCOUNTER — Encounter: Payer: Self-pay | Admitting: Family Medicine

## 2019-06-08 ENCOUNTER — Ambulatory Visit: Payer: Medicare Other | Attending: Internal Medicine

## 2019-06-08 ENCOUNTER — Other Ambulatory Visit: Payer: Self-pay

## 2019-06-08 DIAGNOSIS — Z20822 Contact with and (suspected) exposure to covid-19: Secondary | ICD-10-CM | POA: Diagnosis not present

## 2019-06-09 LAB — NOVEL CORONAVIRUS, NAA: SARS-CoV-2, NAA: DETECTED — AB

## 2019-06-10 ENCOUNTER — Other Ambulatory Visit (HOSPITAL_COMMUNITY): Payer: Self-pay | Admitting: Nurse Practitioner

## 2019-06-10 ENCOUNTER — Encounter: Payer: Self-pay | Admitting: Nurse Practitioner

## 2019-06-10 ENCOUNTER — Telehealth (HOSPITAL_COMMUNITY): Payer: Self-pay | Admitting: Nurse Practitioner

## 2019-06-10 DIAGNOSIS — U071 COVID-19: Secondary | ICD-10-CM

## 2019-06-10 NOTE — Progress Notes (Signed)
  I connected by phone with Kyle Wilcox on 06/10/2019 at 8:43 AM to discuss the potential use of an new treatment for mild to moderate COVID-19 viral infection in non-hospitalized patients.  This patient is a 69 y.o. male that meets the FDA criteria for Emergency Use Authorization of bamlanivimab or casirivimab\imdevimab.  Has a (+) direct SARS-CoV-2 viral test result  Has mild or moderate COVID-19   Is ? 70 years of age and weighs ? 40 kg  Is NOT hospitalized due to COVID-19  Is NOT requiring oxygen therapy or requiring an increase in baseline oxygen flow rate due to COVID-19  Is within 10 days of symptom onset  Has at least one of the high risk factor(s) for progression to severe COVID-19 and/or hospitalization as defined in EUA.  Specific high risk criteria : >/= 70 yo   I have spoken and communicated the following to the patient or parent/caregiver:  1. FDA has authorized the emergency use of bamlanivimab and casirivimab\imdevimab for the treatment of mild to moderate COVID-19 in adults and pediatric patients with positive results of direct SARS-CoV-2 viral testing who are 40 years of age and older weighing at least 40 kg, and who are at high risk for progressing to severe COVID-19 and/or hospitalization.  2. The significant known and potential risks and benefits of bamlanivimab and casirivimab\imdevimab, and the extent to which such potential risks and benefits are unknown.  3. Information on available alternative treatments and the risks and benefits of those alternatives, including clinical trials.  4. Patients treated with bamlanivimab and casirivimab\imdevimab should continue to self-isolate and use infection control measures (e.g., wear mask, isolate, social distance, avoid sharing personal items, clean and disinfect "high touch" surfaces, and frequent handwashing) according to CDC guidelines.   5. The patient or parent/caregiver has the option to accept or refuse  bamlanivimab or casirivimab\imdevimab .  After reviewing this information with the patient, The patient agreed to proceed with receiving the casirivimab\imdevimab infusion and will be provided a copy of the Fact sheet prior to receiving the infusion.Verlon Au 06/10/2019 8:43 AM

## 2019-06-10 NOTE — Telephone Encounter (Signed)
Spoke to patient regarding positive covid 19 test result. Discussed MAB therapy. Appointment made. Please see orders note.

## 2019-06-13 ENCOUNTER — Ambulatory Visit (HOSPITAL_COMMUNITY)
Admission: RE | Admit: 2019-06-13 | Discharge: 2019-06-13 | Disposition: A | Discharge status: Home / Self Care | Payer: Medicare Other | Source: Ambulatory Visit | Attending: Pulmonary Disease | Admitting: Pulmonary Disease

## 2019-06-13 DIAGNOSIS — U071 COVID-19: Secondary | ICD-10-CM | POA: Diagnosis not present

## 2019-06-13 DIAGNOSIS — Z23 Encounter for immunization: Secondary | ICD-10-CM | POA: Insufficient documentation

## 2019-06-13 MED ORDER — ALBUTEROL SULFATE HFA 108 (90 BASE) MCG/ACT IN AERS: 2.0000 | INHALATION_SPRAY | Freq: Once | RESPIRATORY_TRACT | Status: DC | PRN

## 2019-06-13 MED ORDER — SODIUM CHLORIDE 0.9 % IV SOLN
INTRAVENOUS | Status: DC | PRN
  Administered 2019-06-13: 250 mL via INTRAVENOUS

## 2019-06-13 MED ORDER — EPINEPHRINE 0.3 MG/0.3ML IJ SOAJ: 0.3000 mg | Freq: Once | INTRAMUSCULAR | Status: DC | PRN

## 2019-06-13 MED ORDER — FAMOTIDINE IN NACL 20-0.9 MG/50ML-% IV SOLN: 20.0000 mg | Freq: Once | INTRAVENOUS | Status: DC | PRN

## 2019-06-13 MED ORDER — SODIUM CHLORIDE 0.9 % IV SOLN
Freq: Once | INTRAVENOUS | Status: AC
  Filled 2019-06-13: qty 10

## 2019-06-13 MED ORDER — METHYLPREDNISOLONE SODIUM SUCC 125 MG IJ SOLR: 125.0000 mg | Freq: Once | INTRAMUSCULAR | Status: DC | PRN

## 2019-06-13 MED ORDER — DIPHENHYDRAMINE HCL 50 MG/ML IJ SOLN: 50.0000 mg | Freq: Once | INTRAMUSCULAR | Status: DC | PRN

## 2019-06-13 NOTE — Progress Notes (Signed)
  Diagnosis: COVID-19  Physician: Dr. Jenna Luo  Procedure: Covid Infusion Clinic Med: casirivimab\imdevimab infusion - Provided patient with casirivimab\imdevimab fact sheet for patients, parents and caregivers prior to infusion.  Complications: No immediate complications noted.  Discharge: Discharged home   Jabin Tapp L 06/13/2019

## 2019-06-13 NOTE — Discharge Instructions (Signed)
10 Things You Can Do to Manage Your COVID-19 Symptoms at Home If you have possible or confirmed COVID-19: 1. Stay home from work and school. And stay away from other public places. If you must go out, avoid using any kind of public transportation, ridesharing, or taxis. 2. Monitor your symptoms carefully. If your symptoms get worse, call your healthcare provider immediately. 3. Get rest and stay hydrated. 4. If you have a medical appointment, call the healthcare provider ahead of time and tell them that you have or may have COVID-19. 5. For medical emergencies, call 911 and notify the dispatch personnel that you have or may have COVID-19. 6. Cover your cough and sneezes with a tissue or use the inside of your elbow. 7. Wash your hands often with soap and water for at least 20 seconds or clean your hands with an alcohol-based hand sanitizer that contains at least 60% alcohol. 8. As much as possible, stay in a specific room and away from other people in your home. Also, you should use a separate bathroom, if available. If you need to be around other people in or outside of the home, wear a mask. 9. Avoid sharing personal items with other people in your household, like dishes, towels, and bedding. 10. Clean all surfaces that are touched often, like counters, tabletops, and doorknobs. Use household cleaning sprays or wipes according to the label instructions. cdc.gov/coronavirus 11/15/2018 This information is not intended to replace advice given to you by your health care provider. Make sure you discuss any questions you have with your health care provider. Document Revised: 04/19/2019 Document Reviewed: 04/19/2019 Elsevier Patient Education  2020 Elsevier Inc.  

## 2019-06-13 NOTE — Progress Notes (Signed)
  Diagnosis: COVID-19  Physician: Dr. Joya Gaskins  Procedure: Covid Infusion Clinic Med: bamlanivimab infusion - Provided patient with bamlanimivab fact sheet for patients, parents and caregivers prior to infusion.  Complications: No immediate complications noted.  Discharge: Discharged home   Kyle Wilcox 06/13/2019

## 2019-06-25 DIAGNOSIS — M19012 Primary osteoarthritis, left shoulder: Secondary | ICD-10-CM | POA: Diagnosis not present

## 2019-07-06 ENCOUNTER — Encounter: Payer: Self-pay | Admitting: Family Medicine

## 2019-07-06 MED ORDER — TRAZODONE HCL 50 MG PO TABS: 25.0000 mg | ORAL_TABLET | Freq: Every evening | ORAL | 3 refills | Status: DC | PRN

## 2019-08-01 DIAGNOSIS — H401222 Low-tension glaucoma, left eye, moderate stage: Secondary | ICD-10-CM | POA: Diagnosis not present

## 2019-08-07 ENCOUNTER — Other Ambulatory Visit: Payer: Self-pay | Admitting: Family Medicine

## 2019-08-07 DIAGNOSIS — M5136 Other intervertebral disc degeneration, lumbar region: Secondary | ICD-10-CM

## 2019-08-23 ENCOUNTER — Ambulatory Visit: Payer: Medicare Other

## 2019-09-12 ENCOUNTER — Ambulatory Visit: Payer: Medicare Other | Attending: Internal Medicine

## 2019-09-12 DIAGNOSIS — Z23 Encounter for immunization: Secondary | ICD-10-CM

## 2019-09-12 NOTE — Progress Notes (Signed)
   Covid-19 Vaccination Clinic  Name:  Kyle Wilcox    MRN: [id number] DOB: 04/26/1951  09/12/2019  [name] was observed post Covid-19 immunization for 15 minutes without incident. He was provided with Vaccine Information Sheet and instruction to access the V-Safe system.   [name] was instructed to call 911 with any severe reactions post vaccine: Marland Kitchen Difficulty breathing  . Swelling of face and throat  . A fast heartbeat  . A bad rash all over body  . Dizziness and weakness   Immunizations Administered    Name Date Dose VIS Date Route   Pfizer COVID-19 Vaccine 09/12/2019  8:23 AM 0.3 mL 07/11/2018 Intramuscular   Manufacturer: [place]   Lot: [id number]   [place]: [id number]

## 2019-09-17 ENCOUNTER — Ambulatory Visit: Payer: Medicare Other

## 2019-09-28 DIAGNOSIS — M19012 Primary osteoarthritis, left shoulder: Secondary | ICD-10-CM | POA: Diagnosis not present

## 2019-10-06 ENCOUNTER — Encounter: Payer: Self-pay | Admitting: Family Medicine

## 2019-10-06 DIAGNOSIS — G6289 Other specified polyneuropathies: Secondary | ICD-10-CM

## 2019-10-06 DIAGNOSIS — F5104 Psychophysiologic insomnia: Secondary | ICD-10-CM

## 2019-10-08 ENCOUNTER — Ambulatory Visit: Payer: Medicare Other

## 2019-10-08 MED ORDER — GABAPENTIN 600 MG PO TABS: 600.0000 mg | ORAL_TABLET | Freq: Two times a day (BID) | ORAL | 3 refills | Status: DC

## 2019-10-14 ENCOUNTER — Other Ambulatory Visit: Payer: Self-pay | Admitting: Family Medicine

## 2019-10-14 DIAGNOSIS — G6289 Other specified polyneuropathies: Secondary | ICD-10-CM

## 2019-10-14 DIAGNOSIS — F5104 Psychophysiologic insomnia: Secondary | ICD-10-CM

## 2019-11-27 ENCOUNTER — Encounter: Payer: Self-pay | Admitting: Family Medicine

## 2019-12-03 ENCOUNTER — Encounter: Payer: Self-pay | Admitting: Family Medicine

## 2019-12-17 ENCOUNTER — Encounter: Payer: Self-pay | Admitting: Family Medicine

## 2019-12-17 DIAGNOSIS — E782 Mixed hyperlipidemia: Secondary | ICD-10-CM

## 2019-12-18 MED ORDER — ATORVASTATIN CALCIUM 40 MG PO TABS: 40.0000 mg | ORAL_TABLET | Freq: Every day | ORAL | 2 refills | Status: DC

## 2019-12-20 DIAGNOSIS — Z20822 Contact with and (suspected) exposure to covid-19: Secondary | ICD-10-CM | POA: Diagnosis not present

## 2019-12-20 DIAGNOSIS — Z03818 Encounter for observation for suspected exposure to other biological agents ruled out: Secondary | ICD-10-CM | POA: Diagnosis not present

## 2019-12-25 DIAGNOSIS — M19012 Primary osteoarthritis, left shoulder: Secondary | ICD-10-CM | POA: Diagnosis not present

## 2019-12-28 ENCOUNTER — Other Ambulatory Visit: Payer: Self-pay | Admitting: Orthopedic Surgery

## 2019-12-28 DIAGNOSIS — M19012 Primary osteoarthritis, left shoulder: Secondary | ICD-10-CM | POA: Diagnosis not present

## 2019-12-28 DIAGNOSIS — M25512 Pain in left shoulder: Secondary | ICD-10-CM

## 2019-12-28 DIAGNOSIS — M542 Cervicalgia: Secondary | ICD-10-CM | POA: Diagnosis not present

## 2020-01-02 ENCOUNTER — Encounter: Payer: Self-pay | Admitting: Family Medicine

## 2020-01-27 ENCOUNTER — Encounter: Payer: Self-pay | Admitting: Family Medicine

## 2020-01-28 ENCOUNTER — Other Ambulatory Visit: Payer: Self-pay | Admitting: *Deleted

## 2020-01-28 DIAGNOSIS — Z8546 Personal history of malignant neoplasm of prostate: Secondary | ICD-10-CM

## 2020-01-28 DIAGNOSIS — E782 Mixed hyperlipidemia: Secondary | ICD-10-CM

## 2020-01-30 ENCOUNTER — Ambulatory Visit
Admission: RE | Admit: 2020-01-30 | Discharge: 2020-01-30 | Disposition: A | Discharge status: Home / Self Care | Payer: Medicare Other | Source: Ambulatory Visit | Attending: Orthopedic Surgery | Admitting: Orthopedic Surgery

## 2020-01-30 DIAGNOSIS — M25412 Effusion, left shoulder: Secondary | ICD-10-CM | POA: Diagnosis not present

## 2020-01-30 DIAGNOSIS — Z8546 Personal history of malignant neoplasm of prostate: Secondary | ICD-10-CM | POA: Diagnosis not present

## 2020-01-30 DIAGNOSIS — M19012 Primary osteoarthritis, left shoulder: Secondary | ICD-10-CM | POA: Diagnosis not present

## 2020-01-30 DIAGNOSIS — M25512 Pain in left shoulder: Secondary | ICD-10-CM

## 2020-01-30 DIAGNOSIS — I7090 Unspecified atherosclerosis: Secondary | ICD-10-CM | POA: Diagnosis not present

## 2020-02-08 ENCOUNTER — Other Ambulatory Visit: Payer: Self-pay

## 2020-02-08 ENCOUNTER — Other Ambulatory Visit: Payer: Medicare Other

## 2020-02-08 DIAGNOSIS — Z20828 Contact with and (suspected) exposure to other viral communicable diseases: Secondary | ICD-10-CM | POA: Diagnosis not present

## 2020-02-11 ENCOUNTER — Encounter: Payer: Medicare Other | Admitting: Family Medicine

## 2020-02-15 ENCOUNTER — Other Ambulatory Visit: Payer: Self-pay

## 2020-02-15 ENCOUNTER — Other Ambulatory Visit: Payer: Medicare Other

## 2020-02-15 DIAGNOSIS — Z8546 Personal history of malignant neoplasm of prostate: Secondary | ICD-10-CM

## 2020-02-15 DIAGNOSIS — E782 Mixed hyperlipidemia: Secondary | ICD-10-CM | POA: Diagnosis not present

## 2020-02-16 LAB — COMPLETE METABOLIC PANEL WITH GFR
AG Ratio: 1.8 (calc) (ref 1.0–2.5)
ALT: 24 U/L (ref 9–46)
AST: 19 U/L (ref 10–35)
Albumin: 4.4 g/dL (ref 3.6–5.1)
Alkaline phosphatase (APISO): 86 U/L (ref 35–144)
BUN: 18 mg/dL (ref 7–25)
CO2: 30 mmol/L (ref 20–32)
Calcium: 9.9 mg/dL (ref 8.6–10.3)
Chloride: 104 mmol/L (ref 98–110)
Creat: 0.87 mg/dL (ref 0.70–1.18)
GFR, Est African American: 101 mL/min/{1.73_m2} (ref >=60)
GFR, Est Non African American: 87 mL/min/{1.73_m2} (ref >=60)
Globulin: 2.5 g/dL (calc) (ref 1.9–3.7)
Glucose, Bld: 93 mg/dL (ref 65–99)
Potassium: 4.7 mmol/L (ref 3.5–5.3)
Sodium: 141 mmol/L (ref 135–146)
Total Bilirubin: 0.4 mg/dL (ref 0.2–1.2)
Total Protein: 6.9 g/dL (ref 6.1–8.1)

## 2020-02-16 LAB — CBC WITH DIFFERENTIAL/PLATELET
Absolute Monocytes: 819 cells/uL (ref 200–950)
Basophils Absolute: 39 cells/uL (ref 0–200)
Basophils Relative: 0.6 %
Eosinophils Absolute: 169 cells/uL (ref 15–500)
Eosinophils Relative: 2.6 %
HCT: 47.1 % (ref 38.5–50.0)
Hemoglobin: 15.9 g/dL (ref 13.2–17.1)
Lymphs Abs: 2119 cells/uL (ref 850–3900)
MCH: 30.7 pg (ref 27.0–33.0)
MCHC: 33.8 g/dL (ref 32.0–36.0)
MCV: 90.9 fL (ref 80.0–100.0)
MPV: 10.3 fL (ref 7.5–12.5)
Monocytes Relative: 12.6 %
Neutro Abs: 3354 cells/uL (ref 1500–7800)
Neutrophils Relative %: 51.6 %
Platelets: 226 10*3/uL (ref 140–400)
RBC: 5.18 10*6/uL (ref 4.20–5.80)
RDW: 13 % (ref 11.0–15.0)
Total Lymphocyte: 32.6 %
WBC: 6.5 10*3/uL (ref 3.8–10.8)

## 2020-02-16 LAB — LIPID PANEL
Cholesterol: 152 mg/dL (ref <200)
HDL: 35 mg/dL — ABNORMAL LOW (ref >=40)
LDL Cholesterol (Calc): 92 mg/dL (calc)
Non-HDL Cholesterol (Calc): 117 mg/dL (calc) (ref <130)
Total CHOL/HDL Ratio: 4.3 (calc) (ref <5.0)
Triglycerides: 145 mg/dL (ref <150)

## 2020-02-16 LAB — PSA: PSA: <0.04 ng/mL (ref <=4.0)

## 2020-02-19 ENCOUNTER — Ambulatory Visit (INDEPENDENT_AMBULATORY_CARE_PROVIDER_SITE_OTHER): Payer: Medicare Other | Admitting: Family Medicine

## 2020-02-19 ENCOUNTER — Other Ambulatory Visit: Payer: Self-pay

## 2020-02-19 VITALS — BP 124/80 | HR 51 | Temp 98.1°F | Ht 69.0 in | Wt 180.0 lb

## 2020-02-19 DIAGNOSIS — R0989 Other specified symptoms and signs involving the circulatory and respiratory systems: Secondary | ICD-10-CM | POA: Diagnosis not present

## 2020-02-19 DIAGNOSIS — Z0001 Encounter for general adult medical examination with abnormal findings: Secondary | ICD-10-CM | POA: Diagnosis not present

## 2020-02-19 DIAGNOSIS — G6289 Other specified polyneuropathies: Secondary | ICD-10-CM

## 2020-02-19 DIAGNOSIS — Z8546 Personal history of malignant neoplasm of prostate: Secondary | ICD-10-CM

## 2020-02-19 DIAGNOSIS — Z0000 Encounter for general adult medical examination without abnormal findings: Secondary | ICD-10-CM

## 2020-02-19 DIAGNOSIS — E782 Mixed hyperlipidemia: Secondary | ICD-10-CM

## 2020-02-19 NOTE — Progress Notes (Signed)
Subjective:    Patient ID: Kyle Wilcox, male    DOB: 31-Aug-1950, 69 y.o.   MRN: [id number]  HPI Patient is a very pleasant 69 year old Caucasian male here today for his Medicare wellness visit.  He denies any depression, falls, or memory loss.  He does have a remote history of smoking.  He began smoking in the late 60s and quit in the early 80s.  He was screened for AAA in 2017 and this was normal.  He does not have sufficient smoking history to warrant a CT scan of the lungs for lung cancer.  His last colonoscopy was in 2018 and was normal without any polyps or irregularities.  He does have a history of prostate cancer and had his prostate removed.  He continues to suffer from neuropathy.  He is not sure the gabapentin is helping.  I performed a foot exam today.  He has normal sensation to 10 g monofilament bilaterally however he does have diminished dorsalis pedis pulses in his left foot.  He also has decreased capillary refill in the left foot.  All of his immunizations are up-to-date.  He had his flu shot at an outside clinic.  He has had his Covid vaccine.  Otherwise he is doing well with no concerns Immunization History  Administered Date(s) Administered  . Influenza, High Dose Seasonal PF 02/23/2016, 01/05/2017, 01/12/2019  . Influenza,inj,Quad PF,6+ Mos 03/10/2015, 02/08/2018  . Influenza-Unspecified 02/08/2018  . PFIZER SARS-COV-2 Vaccination 09/12/2019, 10/02/2019  . Pneumococcal Conjugate-13 11/07/2014, 01/05/2017  . Pneumococcal Polysaccharide-23 03/17/2016  . Tdap 03/11/2015  . Zoster Recombinat (Shingrix) 05/29/2018, 09/26/2018    Past Medical History:  Diagnosis Date  . Allergy 2014  . Arthritis    Phreesia 02/16/2020  . Cancer [facility]) 2015   prostate cancer  . Cataract 2016  . DDD (degenerative disc disease), lumbar 12/24/2014  . Glaucoma   . Hx of colonic polyps 02/19/2011  . Hyperlipidemia 11/07/2014  . Peripheral neuropathy 12/24/2014   Past Surgical History:  Procedure  Laterality Date  . COLONOSCOPY    . EYE SURGERY     laser surgery to relieve pressure on eyes- both eyes  . JOINT REPLACEMENT  2011   total shoulder replacement (Right)  . LAPAROTOMY N/A 01/06/2018   Procedure: EXPLORATORY LAPAROTOMY;  Surgeon: Judeth Horn, MD;  Location: [place];  Service: General;  Laterality: N/A;  . LYSIS OF ADHESION N/A 01/06/2018   Procedure: LYSIS OF ADHESION;  Surgeon: Judeth Horn, MD;  Location: [place];  Service: General;  Laterality: N/A;  . PROSTATE SURGERY     2015  . SMALL INTESTINE SURGERY N/A    Phreesia 02/16/2020  . VASECTOMY  1982   Current Outpatient Medications on File Prior to Visit  Medication Sig Dispense Refill  . acetaminophen (TYLENOL) 500 MG tablet Take 500-1,000 mg by mouth every 6 (six) hours as needed for moderate pain or headache.    Marland Kitchen aspirin (ASPIR-81) 81 MG EC tablet Take 81 mg by mouth daily. Swallow whole.    Marland Kitchen atorvastatin (LIPITOR) 40 MG tablet Take 1 tablet (40 mg total) by mouth daily at 6 PM. 90 tablet 2  . diphenhydrAMINE (BENADRYL) 25 MG tablet Take 25 mg by mouth See admin instructions. Take 25 mg daily, may take a second 25 mg dose as needed for allergies    . dorzolamide-timolol (COSOPT) 22.3-6.8 MG/ML ophthalmic solution Place 1 drop into both eyes 2 (two) times daily.     Marland Kitchen FIBER PO Take 1 capsule by  mouth 2 (two) times daily.    . Fluticasone Propionate (ALLERGY RELIEF NA) Place 1 spray into the nose daily as needed (allergies).    . gabapentin (NEURONTIN) 600 MG tablet Take 1 tablet (600 mg total) by mouth 2 (two) times daily. 180 tablet 3  . Latanoprostene Bunod (VYZULTA) 0.024 % SOLN Place 1 drop into both eyes at bedtime.     . Melatonin 3 MG CAPS Take 3 mg by mouth at bedtime as needed (sleep).    . Menthol, Topical Analgesic, (BIOFREEZE EX) Apply 1 application topically daily as needed (pain).    . Omega-3 Fatty Acids (OMEGA-3 FISH OIL) 300 MG CAPS Take 300 mg by mouth daily.    . ONE DAILY MULTIPLE VITAMIN PO Take 1  tablet by mouth daily.     . Simethicone (GAS-X PO) Take 1 tablet by mouth daily as needed (gas).    . traZODone (DESYREL) 50 MG tablet Take 0.5-1 tablets (25-50 mg total) by mouth at bedtime as needed for sleep. 90 tablet 3  . vitamin C (ASCORBIC ACID) 500 MG tablet Take 500 mg by mouth daily.    . meloxicam (MOBIC) 7.5 MG tablet Take 1 tablet (7.5 mg total) by mouth daily. (Patient not taking: Reported on 02/13/2020) 90 tablet 3   No current facility-administered medications on file prior to visit.   No Known Allergies Social History   Socioeconomic History  . Marital status: Married    Spouse name: Not on file  . Number of children: Not on file  . Years of education: Not on file  . Highest education level: Not on file  Occupational History  . Not on file  Tobacco Use  . Smoking status: Former Smoker    Quit date: 03/09/1981    Years since quitting: 38.9  . Smokeless tobacco: Never Used  Substance and Sexual Activity  . Alcohol use: Yes    Alcohol/week: 2.0 standard drinks    Types: 2 Cans of beer per week    Comment: occasionally  . Drug use: No  . Sexual activity: Not Currently  Other Topics Concern  . Not on file  Social History Narrative  . Not on file   Social Determinants of Health   Financial Resource Strain:   . Difficulty of Paying Living Expenses: Not on file  Food Insecurity:   . Worried About Charity fundraiser in the Last Year: Not on file  . Ran Out of Food in the Last Year: Not on file  Transportation Needs:   . Lack of Transportation (Medical): Not on file  . Lack of Transportation (Non-Medical): Not on file  Physical Activity:   . Days of Exercise per Week: Not on file  . Minutes of Exercise per Session: Not on file  Stress:   . Feeling of Stress : Not on file  Social Connections:   . Frequency of Communication with Friends and Family: Not on file  . Frequency of Social Gatherings with Friends and Family: Not on file  . Attends Religious  Services: Not on file  . Active Member of Clubs or Organizations: Not on file  . Attends Archivist Meetings: Not on file  . Marital Status: Not on file  Intimate Partner Violence:   . Fear of Current or Ex-Partner: Not on file  . Emotionally Abused: Not on file  . Physically Abused: Not on file  . Sexually Abused: Not on file   Family History  Problem Relation Age of Onset  .  Arthritis Mother   . Heart disease Mother   . Hyperlipidemia Mother   . Hypertension Mother   . Stroke Mother   . Vision loss Mother   . Arthritis Father   . Depression Father   . Heart disease Father   . Hyperlipidemia Father   . Stroke Father   . Arthritis Sister   . Cancer Sister   . Diabetes Sister   . Hyperlipidemia Sister   . Arthritis Brother   . Diabetes Brother   . Heart disease Brother   . Hyperlipidemia Brother   . Hypertension Brother   . Stomach cancer Maternal Grandfather   . Colon cancer Neg Hx   . Esophageal cancer Neg Hx   . Rectal cancer Neg Hx      Review of Systems  All other systems reviewed and are negative.      Objective:   Physical Exam Vitals reviewed.  Constitutional:      General: He is not in acute distress.    Appearance: Normal appearance. He is not ill-appearing, toxic-appearing or diaphoretic.  HENT:     Head: Normocephalic and atraumatic.     Right Ear: Tympanic membrane, ear canal and external ear normal.     Left Ear: Tympanic membrane, ear canal and external ear normal.     Nose: Nose normal. No congestion or rhinorrhea.     Mouth/Throat:     Mouth: Mucous membranes are moist.     Pharynx: Oropharynx is clear. No oropharyngeal exudate or posterior oropharyngeal erythema.  Eyes:     General: No scleral icterus.       Right eye: No discharge.        Left eye: No discharge.     Extraocular Movements: Extraocular movements intact.     Conjunctiva/sclera: Conjunctivae normal.     Pupils: Pupils are equal, round, and reactive to light.    Neck:     Vascular: No carotid bruit.  Cardiovascular:     Rate and Rhythm: Normal rate and regular rhythm.     Pulses:          Dorsalis pedis pulses are 0 on the left side.     Heart sounds: Normal heart sounds. No murmur heard.  No friction rub. No gallop.   Pulmonary:     Effort: Pulmonary effort is normal. No respiratory distress.     Breath sounds: Normal breath sounds. No stridor. No wheezing, rhonchi or rales.  Chest:     Chest wall: No tenderness.  Abdominal:     General: Abdomen is flat. Bowel sounds are normal. There is no distension.     Palpations: Abdomen is soft. There is no mass.     Tenderness: There is no abdominal tenderness. There is no right CVA tenderness, left CVA tenderness, guarding or rebound.     Hernia: No hernia is present.  Musculoskeletal:     Cervical back: Normal range of motion and neck supple.     Right lower leg: No edema.     Left lower leg: No edema.  Lymphadenopathy:     Cervical: No cervical adenopathy.  Skin:    General: Skin is warm.     Coloration: Skin is not jaundiced or pale.     Findings: No bruising, erythema, lesion or rash.  Neurological:     General: No focal deficit present.     Mental Status: He is alert and oriented to person, place, and time. Mental status is at baseline.  Cranial Nerves: No cranial nerve deficit.     Sensory: No sensory deficit.     Motor: No weakness.     Coordination: Coordination normal.     Gait: Gait normal.     Deep Tendon Reflexes: Reflexes normal.  Psychiatric:        Mood and Affect: Mood normal.        Behavior: Behavior normal.        Thought Content: Thought content normal.        Judgment: Judgment normal.           Assessment & Plan:  General medical exam  History of prostate cancer  Mixed hyperlipidemia  Other polyneuropathy  Absent pedal pulses - Plan: VAS [place] ABI WITH/WO TBI  Appointment on 02/15/2020  Component Date Value Ref Range Status  . PSA 02/15/2020 <0.04   < OR = 4.0 ng/mL Final   Comment: The total PSA value from this assay system is  standardized against the WHO standard. The test  result will be approximately 20% lower when compared  to the equimolar-standardized total PSA (Beckman  Coulter). Comparison of serial PSA results should be  interpreted with this fact in mind. . This test was performed using the Siemens  chemiluminescent method. Values obtained from  different assay methods cannot be used interchangeably. PSA levels, regardless of value, should not be interpreted as absolute evidence of the presence or absence of disease.   . Cholesterol 02/15/2020 152  <200 mg/dL Final  . HDL 02/15/2020 35* > OR = 40 mg/dL Final  . Triglycerides 02/15/2020 145  <150 mg/dL Final  . LDL Cholesterol (Calc) 02/15/2020 92  mg/dL (calc) Final   Comment: Reference range: <100 . Desirable range <100 mg/dL for primary prevention;   <70 mg/dL for patients with CHD or diabetic patients  with > or = 2 CHD risk factors. Marland Kitchen LDL-C is now calculated using the Martin-Hopkins  calculation, which is a validated novel method providing  better accuracy than the Friedewald equation in the  estimation of LDL-C.  Cresenciano Genre et al. Annamaria Helling. 1749;449(67): 2061-2068  (http://education.[facility].com/faq/FAQ164)   . Total CHOL/HDL Ratio 02/15/2020 4.3  <5.0 (calc) Final  . Non-HDL Cholesterol (Calc) 02/15/2020 117  <130 mg/dL (calc) Final   Comment: For patients with diabetes plus 1 major ASCVD risk  factor, treating to a non-HDL-C goal of <100 mg/dL  (LDL-C of <70 mg/dL) is considered a therapeutic  option.   . Glucose, Bld 02/15/2020 93  65 - 99 mg/dL Final   Comment: .            Fasting reference interval .   . BUN 02/15/2020 18  7 - 25 mg/dL Final  . Creat 02/15/2020 0.87  0.70 - 1.18 mg/dL Final   Comment: For patients >67 years of age, the reference limit for Creatinine is approximately 13% higher for people identified as  African-American. .   . GFR, Est Non African American 02/15/2020 87  > OR = 60 mL/min/1.96m2 Final  . GFR, Est African American 02/15/2020 101  > OR = 60 mL/min/1.79m2 Final  . BUN/Creatinine Ratio 59/16/3846 NOT APPLICABLE  6 - 22 (calc) Final  . Sodium 02/15/2020 141  135 - 146 mmol/L Final  . Potassium 02/15/2020 4.7  3.5 - 5.3 mmol/L Final  . Chloride 02/15/2020 104  98 - 110 mmol/L Final  . CO2 02/15/2020 30  20 - 32 mmol/L Final  . Calcium 02/15/2020 9.9  8.6 - 10.3 mg/dL Final  .  Total Protein 02/15/2020 6.9  6.1 - 8.1 g/dL Final  . Albumin 02/15/2020 4.4  3.6 - 5.1 g/dL Final  . Globulin 02/15/2020 2.5  1.9 - 3.7 g/dL (calc) Final  . AG Ratio 02/15/2020 1.8  1.0 - 2.5 (calc) Final  . Total Bilirubin 02/15/2020 0.4  0.2 - 1.2 mg/dL Final  . Alkaline phosphatase (APISO) 02/15/2020 86  35 - 144 U/L Final  . AST 02/15/2020 19  10 - 35 U/L Final  . ALT 02/15/2020 24  9 - 46 U/L Final  . WBC 02/15/2020 6.5  3.8 - 10.8 Thousand/uL Final  . RBC 02/15/2020 5.18  4.20 - 5.80 Million/uL Final  . Hemoglobin 02/15/2020 15.9  13.2 - 17.1 g/dL Final  . HCT 02/15/2020 47.1  38 - 50 % Final  . MCV 02/15/2020 90.9  80.0 - 100.0 fL Final  . MCH 02/15/2020 30.7  27.0 - 33.0 pg Final  . MCHC 02/15/2020 33.8  32.0 - 36.0 g/dL Final  . RDW 02/15/2020 13.0  11.0 - 15.0 % Final  . Platelets 02/15/2020 226  140 - 400 Thousand/uL Final  . MPV 02/15/2020 10.3  7.5 - 12.5 fL Final  . Neutro Abs 02/15/2020 3,354  1,500 - 7,800 cells/uL Final  . Lymphs Abs 02/15/2020 2,119  850 - 3,900 cells/uL Final  . Absolute Monocytes 02/15/2020 819  200 - 950 cells/uL Final  . Eosinophils Absolute 02/15/2020 169  15 - 500 cells/uL Final  . Basophils Absolute 02/15/2020 39  0 - 200 cells/uL Final  . Neutrophils Relative % 02/15/2020 51.6  % Final  . Total Lymphocyte 02/15/2020 32.6  % Final  . Monocytes Relative 02/15/2020 12.6  % Final  . Eosinophils Relative 02/15/2020 2.6  % Final  . Basophils Relative  02/15/2020 0.6  % Final   Labs are excellent except for low HDL cholesterol which I encouraged aerobic exercise and fish oil.  I performed cryotherapy on 4 separate seborrheic keratoses on the medial surface of his right knee free of charge.  Colonoscopy and prostate cancer screening are up-to-date.  Immunizations are up-to-date.  He denies any falls, depression, or memory loss.  I am concerned because I am not able to palpate pulses in his left foot.  Recommended a vascular ultrasound with ABIs to evaluate for any peripheral vascular disease.  Otherwise his physical exam is excellent.

## 2020-02-21 ENCOUNTER — Other Ambulatory Visit: Payer: Self-pay

## 2020-02-21 ENCOUNTER — Encounter (HOSPITAL_COMMUNITY): Payer: Self-pay

## 2020-02-21 ENCOUNTER — Ambulatory Visit (HOSPITAL_COMMUNITY)
Admission: RE | Admit: 2020-02-21 | Discharge: 2020-02-21 | Disposition: A | Discharge status: Home / Self Care | Payer: Medicare Other | Source: Ambulatory Visit | Attending: Family Medicine | Admitting: Family Medicine

## 2020-02-21 DIAGNOSIS — R0989 Other specified symptoms and signs involving the circulatory and respiratory systems: Secondary | ICD-10-CM | POA: Insufficient documentation

## 2020-02-21 NOTE — Progress Notes (Addendum)
COVID Vaccine Completed: x2 Date COVID Vaccine completed: 09-12-19 & 10-02-19 COVID vaccine manufacturer: [place]   PCP - Jenna Luo, MD Cardiologist -   Chest x-ray -  EKG -  Stress Test - 2015 ECHO -  Cardiac Cath -  Pacemaker/ICD device last checked:  Sleep Study - 5+ negative for sleep apnea CPAP -   Fasting Blood Sugar -  Checks Blood Sugar _____ times a day  Blood Thinner Instructions: Aspirin Instructions: Last Dose:  Anesthesia review:   Patient denies shortness of breath, fever, cough and chest pain at PAT appointment   Patient verbalized understanding of instructions that were given to them at the PAT appointment. Patient was also instructed that they will need to review over the PAT instructions again at home before surgery.

## 2020-02-21 NOTE — Patient Instructions (Addendum)
DUE TO COVID-19 ONLY ONE VISITOR IS ALLOWED TO COME WITH YOU AND STAY IN THE WAITING ROOM ONLY  DURING PRE OP AND PROCEDURE.   IF YOU WILL BE ADMITTED INTO THE HOSPITAL YOU ARE ALLOWED ONE SUPPORT PERSON DURING  VISITATION HOURS ONLY (10AM -8PM)   . The support person may change daily. . The support person must pass our screening, gel in and out, and wear a mask at all times, including in the patient's room. . Patients must also wear a mask when staff or their support person are in the room.   COVID SWAB TESTING MUST BE COMPLETED ON:  Friday, 02-29-20 @ 10:05 AM   [address] [place], [place] [address]  (Must self quarantine after testing. Follow instructions on  handout.)        Your procedure is scheduled on:  Tuesday, 03-04-20   Report to [facility] Main  Entrance   Report to Short Stay at 5:30 AM   [facility])    Call this number if you have problems the morning of surgery [phone]   Do not eat food :After Midnight.   May have liquids until 4:30 AM  day of surgery  CLEAR LIQUID DIET  Foods Allowed                                                                     Foods Excluded  Water, Black Coffee and tea, regular and decaf             liquids that you cannot  Plain Jell-O in any flavor  (No red)                                    see through such as: Fruit ices (not with fruit pulp)                                      milk, soups, orange juice              Iced Popsicles (No red)                                      All solid food                                   Apple juices Sports drinks like Gatorade (No red) Lightly seasoned clear broth or consume(fat free) Sugar, honey syrup     Complete one Ensure drink the morning of surgery at 4:30 AM  the day of surgery.    Oral Hygiene is also important to reduce your risk of infection.                                    Remember - BRUSH YOUR TEETH THE MORNING OF SURGERY WITH YOUR REGULAR  TOOTHPASTE   Do NOT smoke  after Midnight   Take these medicines the morning of surgery with A SIP OF WATER:  None                               You may not have any metal on your body including jewelry, and body piercings             Do not wear  lotions, powders, perfumes/cologne, or deodorant             Men may shave face and neck.   Do not bring valuables to the hospital. [place].   Contacts, dentures or bridgework may not be worn into surgery.   Bring small overnight bag day of surgery.    Please read over the following fact sheets you were given: IF YOU HAVE QUESTIONS ABOUT YOUR PRE OP INSTRUCTIONS PLEASE CALL [place]- Preparing for Total Shoulder Arthroplasty    Before surgery, you can play an important role. Because skin is not sterile, your skin needs to be as free of germs as possible. You can reduce the number of germs on your skin by using the following products. . Benzoyl Peroxide Gel o Reduces the number of germs present on the skin o Applied twice a day to shoulder area starting two days before surgery    ==================================================================  Please follow these instructions carefully:  BENZOYL PEROXIDE 5% GEL  Please do not use if you have an allergy to benzoyl peroxide.   If your skin becomes reddened/irritated stop using the benzoyl peroxide.  Starting two days before surgery, apply as follows: 1. Apply benzoyl peroxide in the morning and at night. Apply after taking a shower. If you are not taking a shower clean entire shoulder front, back, and side along with the armpit with a clean wet washcloth.  2. Place a quarter-sized dollop on your shoulder and rub in thoroughly, making sure to cover the front, back, and side of your shoulder, along with the armpit.   2 days before ____ AM   ____ PM              1 day before ____ AM   ____ PM                         3. Do this  twice a day for two days.  (Last application is the night before surgery, AFTER using the CHG soap as described below).  4. Do NOT apply benzoyl peroxide gel on the day of surgery.    [facility] - Preparing for Surgery Before surgery, you can play an important role.  Because skin is not sterile, your skin needs to be as free of germs as possible.  You can reduce the number of germs on your skin by washing with CHG (chlorahexidine gluconate) soap before surgery.  CHG is an antiseptic cleaner which kills germs and bonds with the skin to continue killing germs even after washing. Please DO NOT use if you have an allergy to CHG or antibacterial soaps.  If your skin becomes reddened/irritated stop using the CHG and inform your nurse when you arrive at Short Stay. Do not shave (including legs and underarms) for at least 48 hours prior to the first CHG shower.  You may shave your face/neck.  Please follow these instructions carefully:  1.  Shower with CHG  Soap the night before surgery and the  morning of surgery.  2.  If you choose to wash your hair, wash your hair first as usual with your normal  shampoo.  3.  After you shampoo, rinse your hair and body thoroughly to remove the shampoo.                             4.  Use CHG as you would any other liquid soap.  You can apply chg directly to the skin and wash.  Gently with a scrungie or clean washcloth.  5.  Apply the CHG Soap to your body ONLY FROM THE NECK DOWN.   Do   not use on face/ open                           Wound or open sores. Avoid contact with eyes, ears mouth and   genitals (private parts).                       Wash face,  Genitals (private parts) with your normal soap.             6.  Wash thoroughly, paying special attention to the area where your    surgery  will be performed.  7.  Thoroughly rinse your body with warm water from the neck down.  8.  DO NOT shower/wash with your normal soap after using and rinsing off the CHG Soap.                 9.  Pat yourself dry with a clean towel.            10.  Wear clean pajamas.            11.  Place clean sheets on your bed the night of your first shower and do not  sleep with pets. Day of Surgery : Do not apply any lotions/deodorants the morning of surgery.  Please wear clean clothes to the hospital/surgery center.  FAILURE TO FOLLOW THESE INSTRUCTIONS MAY RESULT IN THE CANCELLATION OF YOUR SURGERY  PATIENT SIGNATURE_________________________________  NURSE SIGNATURE__________________________________  ________________________________________________________________________   Adam Phenix  An incentive spirometer is a tool that can help keep your lungs clear and active. This tool measures how well you are filling your lungs with each breath. Taking long deep breaths may help reverse or decrease the chance of developing breathing (pulmonary) problems (especially infection) following:  A long period of time when you are unable to move or be active. BEFORE THE PROCEDURE   If the spirometer includes an indicator to show your best effort, your nurse or respiratory therapist will set it to a desired goal.  If possible, sit up straight or lean slightly forward. Try not to slouch.  Hold the incentive spirometer in an upright position. INSTRUCTIONS FOR USE  1. Sit on the edge of your bed if possible, or sit up as far as you can in bed or on a chair. 2. Hold the incentive spirometer in an upright position. 3. Breathe out normally. 4. Place the mouthpiece in your mouth and seal your lips tightly around it. 5. Breathe in slowly and as deeply as possible, raising the piston or the ball toward the top of the column. 6. Hold your breath for 3-5 seconds or for as long as possible. Allow the piston or ball to fall  to the bottom of the column. 7. Remove the mouthpiece from your mouth and breathe out normally. 8. Rest for a few seconds and repeat Steps 1 through 7 at least 10  times every 1-2 hours when you are awake. Take your time and take a few normal breaths between deep breaths. 9. The spirometer may include an indicator to show your best effort. Use the indicator as a goal to work toward during each repetition. 10. After each set of 10 deep breaths, practice coughing to be sure your lungs are clear. If you have an incision (the cut made at the time of surgery), support your incision when coughing by placing a pillow or rolled up towels firmly against it. Once you are able to get out of bed, walk around indoors and cough well. You may stop using the incentive spirometer when instructed by your caregiver.  RISKS AND COMPLICATIONS  Take your time so you do not get dizzy or light-headed.  If you are in pain, you may need to take or ask for pain medication before doing incentive spirometry. It is harder to take a deep breath if you are having pain. AFTER USE  Rest and breathe slowly and easily.  It can be helpful to keep track of a log of your progress. Your caregiver can provide you with a simple table to help with this. If you are using the spirometer at home, follow these instructions: [place] IF:   You are having difficultly using the spirometer.  You have trouble using the spirometer as often as instructed.  Your pain medication is not giving enough relief while using the spirometer.  You develop fever of 100.5 F (38.1 C) or higher. SEEK IMMEDIATE MEDICAL CARE IF:   You cough up bloody sputum that had not been present before.  You develop fever of 102 F (38.9 C) or greater.  You develop worsening pain at or near the incision site. MAKE SURE YOU:   Understand these instructions.  Will watch your condition.  Will get help right away if you are not doing well or get worse. Document Released: 09/13/2006 Document Revised: 07/26/2011 Document Reviewed: 11/14/2006 [facility] Patient Information 2014 [place],  [place].   ________________________________________________________________________

## 2020-02-22 ENCOUNTER — Encounter (HOSPITAL_COMMUNITY): Payer: Self-pay

## 2020-02-22 ENCOUNTER — Encounter (HOSPITAL_COMMUNITY)
Admission: RE | Admit: 2020-02-22 | Discharge: 2020-02-22 | Disposition: A | Discharge status: Home / Self Care | Payer: Medicare Other | Source: Ambulatory Visit | Attending: Orthopedic Surgery | Admitting: Orthopedic Surgery

## 2020-02-22 DIAGNOSIS — Z01812 Encounter for preprocedural laboratory examination: Secondary | ICD-10-CM | POA: Insufficient documentation

## 2020-02-22 HISTORY — DX: Gastro-esophageal reflux disease without esophagitis: K21.9

## 2020-02-22 LAB — CBC
HCT: 46.1 % (ref 39.0–52.0)
Hemoglobin: 15.4 g/dL (ref 13.0–17.0)
MCH: 30.4 pg (ref 26.0–34.0)
MCHC: 33.4 g/dL (ref 30.0–36.0)
MCV: 91.1 fL (ref 80.0–100.0)
Platelets: 232 10*3/uL (ref 150–400)
RBC: 5.06 MIL/uL (ref 4.22–5.81)
RDW: 13.4 % (ref 11.5–15.5)
WBC: 5.4 10*3/uL (ref 4.0–10.5)
nRBC: 0 % (ref 0.0–0.2)

## 2020-02-22 LAB — SURGICAL PCR SCREEN
MRSA, PCR: NEGATIVE
Staphylococcus aureus: NEGATIVE

## 2020-02-26 ENCOUNTER — Encounter: Payer: Self-pay | Admitting: Family Medicine

## 2020-02-28 ENCOUNTER — Telehealth: Payer: Self-pay

## 2020-02-28 NOTE — Telephone Encounter (Signed)
Pt called triage to get results of his ABI's this week. I have advised him to call his PCP who ordered the study for results. Pt stated he had just left them a message requesting this. He will await their call and call us back if he needs further assistance.

## 2020-02-29 ENCOUNTER — Other Ambulatory Visit (HOSPITAL_COMMUNITY)
Admission: RE | Admit: 2020-02-29 | Discharge: 2020-02-29 | Disposition: A | Discharge status: Home / Self Care | Payer: Medicare Other | Source: Ambulatory Visit | Attending: Orthopedic Surgery | Admitting: Orthopedic Surgery

## 2020-02-29 DIAGNOSIS — Z20822 Contact with and (suspected) exposure to covid-19: Secondary | ICD-10-CM | POA: Diagnosis not present

## 2020-02-29 DIAGNOSIS — Z01812 Encounter for preprocedural laboratory examination: Secondary | ICD-10-CM | POA: Insufficient documentation

## 2020-02-29 LAB — SARS CORONAVIRUS 2 (TAT 6-24 HRS): SARS Coronavirus 2: NEGATIVE

## 2020-03-03 NOTE — Anesthesia Preprocedure Evaluation (Addendum)
Anesthesia Evaluation  Patient identified by MRN, date of birth, ID band Patient awake    Reviewed: Allergy & Precautions, NPO status , Patient's Chart, lab work & pertinent test results  Airway Mallampati: II  TM Distance: >3 FB Neck ROM: Full    Dental  (+) Dental Advisory Given, Teeth Intact, Caps   Pulmonary neg pulmonary ROS, former smoker,    Pulmonary exam normal breath sounds clear to auscultation       Cardiovascular negative cardio ROS Normal cardiovascular exam Rhythm:Regular Rate:Normal     Neuro/Psych    GI/Hepatic Neg liver ROS, GERD  ,  Endo/Other  negative endocrine ROS  Renal/GU negative Renal ROS     Musculoskeletal  (+) Arthritis ,   Abdominal   Peds  Hematology negative hematology ROS (+)   Anesthesia Other Findings   Reproductive/Obstetrics                           Anesthesia Physical Anesthesia Plan  ASA: II  Anesthesia Plan: General   Post-op Pain Management: GA combined w/ Regional for post-op pain   Induction: Intravenous  PONV Risk Score and Plan: 2 and Ondansetron, Dexamethasone and Treatment may vary due to age or medical condition  Airway Management Planned: Oral ETT  Additional Equipment: None  Intra-op Plan:   Post-operative Plan: Extubation in OR  Informed Consent: I have reviewed the patients History and Physical, chart, labs and discussed the procedure including the risks, benefits and alternatives for the proposed anesthesia with the patient or authorized representative who has indicated his/her understanding and acceptance.     Dental advisory given  Plan Discussed with: CRNA  Anesthesia Plan Comments:        Anesthesia Quick Evaluation

## 2020-03-04 ENCOUNTER — Ambulatory Visit (HOSPITAL_COMMUNITY): Payer: Medicare Other | Admitting: Certified Registered"

## 2020-03-04 ENCOUNTER — Ambulatory Visit (HOSPITAL_COMMUNITY)
Admission: RE | Admit: 2020-03-04 | Discharge: 2020-03-04 | Disposition: A | Discharge status: Home / Self Care | Payer: Medicare Other | Source: Ambulatory Visit | Attending: Orthopedic Surgery | Admitting: Orthopedic Surgery

## 2020-03-04 ENCOUNTER — Encounter (HOSPITAL_COMMUNITY): Payer: Self-pay | Admitting: Orthopedic Surgery

## 2020-03-04 ENCOUNTER — Ambulatory Visit (HOSPITAL_COMMUNITY): Payer: Medicare Other

## 2020-03-04 ENCOUNTER — Encounter (HOSPITAL_COMMUNITY)
Admission: RE | Disposition: A | Discharge status: Home / Self Care | Payer: Self-pay | Source: Ambulatory Visit | Attending: Orthopedic Surgery

## 2020-03-04 DIAGNOSIS — Z809 Family history of malignant neoplasm, unspecified: Secondary | ICD-10-CM | POA: Diagnosis not present

## 2020-03-04 DIAGNOSIS — H409 Unspecified glaucoma: Secondary | ICD-10-CM | POA: Insufficient documentation

## 2020-03-04 DIAGNOSIS — Z8261 Family history of arthritis: Secondary | ICD-10-CM | POA: Diagnosis not present

## 2020-03-04 DIAGNOSIS — G629 Polyneuropathy, unspecified: Secondary | ICD-10-CM | POA: Diagnosis not present

## 2020-03-04 DIAGNOSIS — Z8546 Personal history of malignant neoplasm of prostate: Secondary | ICD-10-CM | POA: Insufficient documentation

## 2020-03-04 DIAGNOSIS — Z8616 Personal history of COVID-19: Secondary | ICD-10-CM | POA: Insufficient documentation

## 2020-03-04 DIAGNOSIS — K219 Gastro-esophageal reflux disease without esophagitis: Secondary | ICD-10-CM | POA: Insufficient documentation

## 2020-03-04 DIAGNOSIS — Z96611 Presence of right artificial shoulder joint: Secondary | ICD-10-CM | POA: Diagnosis not present

## 2020-03-04 DIAGNOSIS — F5104 Psychophysiologic insomnia: Secondary | ICD-10-CM | POA: Diagnosis not present

## 2020-03-04 DIAGNOSIS — Z79899 Other long term (current) drug therapy: Secondary | ICD-10-CM | POA: Insufficient documentation

## 2020-03-04 DIAGNOSIS — E785 Hyperlipidemia, unspecified: Secondary | ICD-10-CM | POA: Diagnosis not present

## 2020-03-04 DIAGNOSIS — Z96612 Presence of left artificial shoulder joint: Secondary | ICD-10-CM

## 2020-03-04 DIAGNOSIS — M25712 Osteophyte, left shoulder: Secondary | ICD-10-CM | POA: Insufficient documentation

## 2020-03-04 DIAGNOSIS — G8918 Other acute postprocedural pain: Secondary | ICD-10-CM | POA: Diagnosis not present

## 2020-03-04 DIAGNOSIS — M19012 Primary osteoarthritis, left shoulder: Secondary | ICD-10-CM | POA: Insufficient documentation

## 2020-03-04 DIAGNOSIS — Z87891 Personal history of nicotine dependence: Secondary | ICD-10-CM | POA: Insufficient documentation

## 2020-03-04 DIAGNOSIS — Z9852 Vasectomy status: Secondary | ICD-10-CM | POA: Diagnosis not present

## 2020-03-04 DIAGNOSIS — H269 Unspecified cataract: Secondary | ICD-10-CM | POA: Insufficient documentation

## 2020-03-04 DIAGNOSIS — Z471 Aftercare following joint replacement surgery: Secondary | ICD-10-CM | POA: Diagnosis not present

## 2020-03-04 HISTORY — PX: TOTAL SHOULDER ARTHROPLASTY: SHX126

## 2020-03-04 SURGERY — ARTHROPLASTY, SHOULDER, TOTAL
Anesthesia: General | Site: Shoulder | Laterality: Left

## 2020-03-04 MED ORDER — FENTANYL CITRATE (PF) 250 MCG/5ML IJ SOLN
INTRAMUSCULAR | Status: DC | PRN
Start: 2020-03-04 — End: 2020-03-04
  Administered 2020-03-04 (×2): 50 ug via INTRAVENOUS

## 2020-03-04 MED ORDER — DIPHENHYDRAMINE HCL 12.5 MG/5ML PO ELIX: 12.5000 mg | ORAL_SOLUTION | ORAL | Status: DC | PRN

## 2020-03-04 MED ORDER — ONDANSETRON HCL 4 MG PO TABS: 4.0000 mg | ORAL_TABLET | Freq: Four times a day (QID) | ORAL | Status: DC | PRN

## 2020-03-04 MED ORDER — ALUM & MAG HYDROXIDE-SIMETH 200-200-20 MG/5ML PO SUSP: 30.0000 mL | ORAL | Status: DC | PRN

## 2020-03-04 MED ORDER — MENTHOL 3 MG MT LOZG: 1.0000 | LOZENGE | OROMUCOSAL | Status: DC | PRN

## 2020-03-04 MED ORDER — PROPOFOL 10 MG/ML IV BOLUS
INTRAVENOUS | Status: AC
  Filled 2020-03-04: qty 20

## 2020-03-04 MED ORDER — BISACODYL 10 MG RE SUPP: 10.0000 mg | Freq: Every day | RECTAL | Status: DC | PRN

## 2020-03-04 MED ORDER — LIDOCAINE 2% (20 MG/ML) 5 ML SYRINGE
INTRAMUSCULAR | Status: DC | PRN
  Administered 2020-03-04: 40 mg via INTRAVENOUS

## 2020-03-04 MED ORDER — EPHEDRINE 5 MG/ML INJ
INTRAVENOUS | Status: AC
  Filled 2020-03-04: qty 20

## 2020-03-04 MED ORDER — HYDROCODONE-ACETAMINOPHEN 5-325 MG PO TABS: 1.0000 | ORAL_TABLET | ORAL | Status: DC | PRN

## 2020-03-04 MED ORDER — METHOCARBAMOL 500 MG PO TABS: 500.0000 mg | ORAL_TABLET | Freq: Four times a day (QID) | ORAL | 1 refills | Status: DC | PRN

## 2020-03-04 MED ORDER — FENTANYL CITRATE (PF) 100 MCG/2ML IJ SOLN: 25.0000 ug | INTRAMUSCULAR | Status: DC | PRN

## 2020-03-04 MED ORDER — GLYCOPYRROLATE PF 0.2 MG/ML IJ SOSY
PREFILLED_SYRINGE | INTRAMUSCULAR | Status: AC
  Filled 2020-03-04: qty 1

## 2020-03-04 MED ORDER — MORPHINE SULFATE (PF) 4 MG/ML IV SOLN: 0.5000 mg | INTRAVENOUS | Status: DC | PRN

## 2020-03-04 MED ORDER — PHENOL 1.4 % MT LIQD: 1.0000 | OROMUCOSAL | Status: DC | PRN

## 2020-03-04 MED ORDER — DOCUSATE SODIUM 100 MG PO CAPS: 100.0000 mg | ORAL_CAPSULE | Freq: Two times a day (BID) | ORAL | Status: DC

## 2020-03-04 MED ORDER — POLYETHYLENE GLYCOL 3350 17 G PO PACK: 17.0000 g | PACK | Freq: Every day | ORAL | Status: DC | PRN

## 2020-03-04 MED ORDER — LACTATED RINGERS IV SOLN: INTRAVENOUS | Status: DC

## 2020-03-04 MED ORDER — POVIDONE-IODINE 10 % EX SWAB
2.0000 "application " | Freq: Once | CUTANEOUS | Status: AC
  Administered 2020-03-04: 2 via TOPICAL

## 2020-03-04 MED ORDER — METOCLOPRAMIDE HCL 5 MG PO TABS: 5.0000 mg | ORAL_TABLET | Freq: Three times a day (TID) | ORAL | Status: DC | PRN

## 2020-03-04 MED ORDER — PHENYLEPHRINE HCL (PRESSORS) 10 MG/ML IV SOLN
INTRAVENOUS | Status: AC
  Filled 2020-03-04: qty 1

## 2020-03-04 MED ORDER — CEFAZOLIN SODIUM-DEXTROSE 2-4 GM/100ML-% IV SOLN
2.0000 g | INTRAVENOUS | Status: AC
  Administered 2020-03-04: 2 g via INTRAVENOUS
  Filled 2020-03-04: qty 100

## 2020-03-04 MED ORDER — CEFAZOLIN SODIUM-DEXTROSE 2-4 GM/100ML-% IV SOLN: 2.0000 g | Freq: Four times a day (QID) | INTRAVENOUS | Status: DC

## 2020-03-04 MED ORDER — HYDROCODONE-ACETAMINOPHEN 7.5-325 MG PO TABS: 1.0000 | ORAL_TABLET | ORAL | Status: DC | PRN

## 2020-03-04 MED ORDER — BUPIVACAINE HCL (PF) 0.25 % IJ SOLN
INTRAMUSCULAR | Status: AC
  Filled 2020-03-04: qty 30

## 2020-03-04 MED ORDER — OXYCODONE HCL 5 MG PO TABS
5.0000 mg | ORAL_TABLET | Freq: Four times a day (QID) | ORAL | 0 refills | Status: DC | PRN
Start: 2020-03-04 — End: 2020-09-02

## 2020-03-04 MED ORDER — EPHEDRINE SULFATE-NACL 50-0.9 MG/10ML-% IV SOSY
PREFILLED_SYRINGE | INTRAVENOUS | Status: DC | PRN
  Administered 2020-03-04 (×2): 5 mg via INTRAVENOUS

## 2020-03-04 MED ORDER — PHENYLEPHRINE HCL-NACL 10-0.9 MG/250ML-% IV SOLN
INTRAVENOUS | Status: DC | PRN
  Administered 2020-03-04: 20 ug/min via INTRAVENOUS

## 2020-03-04 MED ORDER — METOCLOPRAMIDE HCL 5 MG/ML IJ SOLN: 5.0000 mg | Freq: Three times a day (TID) | INTRAMUSCULAR | Status: DC | PRN

## 2020-03-04 MED ORDER — GLYCOPYRROLATE 0.2 MG/ML IJ SOLN
INTRAMUSCULAR | Status: DC | PRN
  Administered 2020-03-04: .2 mg via INTRAVENOUS

## 2020-03-04 MED ORDER — PHENYLEPHRINE 40 MCG/ML (10ML) SYRINGE FOR IV PUSH (FOR BLOOD PRESSURE SUPPORT)
PREFILLED_SYRINGE | INTRAVENOUS | Status: DC | PRN
  Administered 2020-03-04: 60 ug via INTRAVENOUS
  Administered 2020-03-04 (×2): 40 ug via INTRAVENOUS

## 2020-03-04 MED ORDER — ORAL CARE MOUTH RINSE: 15.0000 mL | Freq: Once | OROMUCOSAL | Status: AC

## 2020-03-04 MED ORDER — DEXAMETHASONE SODIUM PHOSPHATE 10 MG/ML IJ SOLN
INTRAMUSCULAR | Status: AC
  Filled 2020-03-04: qty 1

## 2020-03-04 MED ORDER — MAGNESIUM CITRATE PO SOLN: 1.0000 | Freq: Once | ORAL | Status: DC | PRN

## 2020-03-04 MED ORDER — LACTATED RINGERS IV BOLUS: 250.0000 mL | Freq: Once | INTRAVENOUS | Status: DC

## 2020-03-04 MED ORDER — ACETAMINOPHEN 500 MG PO TABS: 500.0000 mg | ORAL_TABLET | Freq: Four times a day (QID) | ORAL | Status: DC

## 2020-03-04 MED ORDER — ONDANSETRON HCL 4 MG/2ML IJ SOLN
INTRAMUSCULAR | Status: AC
  Filled 2020-03-04: qty 2

## 2020-03-04 MED ORDER — SUGAMMADEX SODIUM 200 MG/2ML IV SOLN
INTRAVENOUS | Status: DC | PRN
  Administered 2020-03-04: 180 mg via INTRAVENOUS

## 2020-03-04 MED ORDER — PHENYLEPHRINE 40 MCG/ML (10ML) SYRINGE FOR IV PUSH (FOR BLOOD PRESSURE SUPPORT)
PREFILLED_SYRINGE | INTRAVENOUS | Status: AC
  Filled 2020-03-04: qty 10

## 2020-03-04 MED ORDER — MIDAZOLAM HCL 2 MG/2ML IJ SOLN
INTRAMUSCULAR | Status: AC
  Filled 2020-03-04: qty 2

## 2020-03-04 MED ORDER — 0.9 % SODIUM CHLORIDE (POUR BTL) OPTIME
TOPICAL | Status: DC | PRN
  Administered 2020-03-04: 1000 mL

## 2020-03-04 MED ORDER — ROCURONIUM BROMIDE 10 MG/ML (PF) SYRINGE
PREFILLED_SYRINGE | INTRAVENOUS | Status: DC | PRN
  Administered 2020-03-04 (×3): 10 mg via INTRAVENOUS
  Administered 2020-03-04: 60 mg via INTRAVENOUS

## 2020-03-04 MED ORDER — MEPERIDINE HCL 50 MG/ML IJ SOLN: 6.2500 mg | INTRAMUSCULAR | Status: DC | PRN

## 2020-03-04 MED ORDER — ACETAMINOPHEN 500 MG PO TABS
1000.0000 mg | ORAL_TABLET | Freq: Once | ORAL | Status: AC
  Administered 2020-03-04: 1000 mg via ORAL
  Filled 2020-03-04: qty 2

## 2020-03-04 MED ORDER — ACETAMINOPHEN 325 MG PO TABS: 325.0000 mg | ORAL_TABLET | Freq: Four times a day (QID) | ORAL | Status: DC | PRN

## 2020-03-04 MED ORDER — MIDAZOLAM HCL 2 MG/2ML IJ SOLN
INTRAMUSCULAR | Status: DC | PRN
  Administered 2020-03-04 (×2): 1 mg via INTRAVENOUS

## 2020-03-04 MED ORDER — LACTATED RINGERS IV BOLUS: 500.0000 mL | Freq: Once | INTRAVENOUS | Status: DC

## 2020-03-04 MED ORDER — ONDANSETRON HCL 4 MG/2ML IJ SOLN
INTRAMUSCULAR | Status: DC | PRN
  Administered 2020-03-04: 4 mg via INTRAVENOUS

## 2020-03-04 MED ORDER — STERILE WATER FOR IRRIGATION IR SOLN
Status: DC | PRN
  Administered 2020-03-04: 2000 mL

## 2020-03-04 MED ORDER — POTASSIUM CHLORIDE IN NACL 20-0.45 MEQ/L-% IV SOLN: INTRAVENOUS | Status: DC

## 2020-03-04 MED ORDER — CHLORHEXIDINE GLUCONATE 0.12 % MT SOLN
15.0000 mL | Freq: Once | OROMUCOSAL | Status: AC
  Administered 2020-03-04: 15 mL via OROMUCOSAL

## 2020-03-04 MED ORDER — SENNA-DOCUSATE SODIUM 8.6-50 MG PO TABS: 2.0000 | ORAL_TABLET | Freq: Every day | ORAL | 1 refills | Status: DC

## 2020-03-04 MED ORDER — ONDANSETRON HCL 4 MG/2ML IJ SOLN: 4.0000 mg | Freq: Once | INTRAMUSCULAR | Status: DC | PRN

## 2020-03-04 MED ORDER — ONDANSETRON HCL 4 MG PO TABS: 4.0000 mg | ORAL_TABLET | Freq: Three times a day (TID) | ORAL | 0 refills | Status: DC | PRN

## 2020-03-04 MED ORDER — FENTANYL CITRATE (PF) 100 MCG/2ML IJ SOLN
INTRAMUSCULAR | Status: AC
  Filled 2020-03-04: qty 2

## 2020-03-04 MED ORDER — DEXAMETHASONE SODIUM PHOSPHATE 10 MG/ML IJ SOLN
INTRAMUSCULAR | Status: DC | PRN
  Administered 2020-03-04: 4 mg via INTRAVENOUS

## 2020-03-04 MED ORDER — PROPOFOL 10 MG/ML IV BOLUS
INTRAVENOUS | Status: DC | PRN
  Administered 2020-03-04: 150 mg via INTRAVENOUS

## 2020-03-04 MED ORDER — LIDOCAINE 2% (20 MG/ML) 5 ML SYRINGE
INTRAMUSCULAR | Status: AC
  Filled 2020-03-04: qty 5

## 2020-03-04 MED ORDER — ONDANSETRON HCL 4 MG/2ML IJ SOLN: 4.0000 mg | Freq: Four times a day (QID) | INTRAMUSCULAR | Status: DC | PRN

## 2020-03-04 SURGICAL SUPPLY — 63 items
ADPR HD STD TPR HUM TI RVRS (Orthopedic Implant) ×1 IMPLANT
AID PSTN UNV HD RSTRNT DISP (MISCELLANEOUS)
BAG SPEC THK2 15X12 ZIP CLS (MISCELLANEOUS) ×1
BAG ZIPLOCK 12X15 (MISCELLANEOUS) ×2 IMPLANT
BLADE SAW SGTL 18X1.27X75 (BLADE) ×2 IMPLANT
CEMENT BONE R 1X40 (Cement) ×2 IMPLANT
CLSR STERI-STRIP ANTIMIC 1/2X4 (GAUZE/BANDAGES/DRESSINGS) ×2 IMPLANT
COVER BACK TABLE 60X90IN (DRAPES) ×2 IMPLANT
COVER MAYO STAND STRL (DRAPES) ×2 IMPLANT
COVER SURGICAL LIGHT HANDLE (MISCELLANEOUS) ×2 IMPLANT
COVER WAND RF STERILE (DRAPES) IMPLANT
DECANTER SPIKE VIAL GLASS SM (MISCELLANEOUS) IMPLANT
DRAPE POUCH INSTRU U-SHP 10X18 (DRAPES) ×2 IMPLANT
DRAPE SHEET LG 3/4 BI-LAMINATE (DRAPES) ×2 IMPLANT
DRAPE SURG 17X11 SM STRL (DRAPES) ×2 IMPLANT
DRAPE U-SHAPE 47X51 STRL (DRAPES) ×2 IMPLANT
DRSG MEPILEX BORDER 4X8 (GAUZE/BANDAGES/DRESSINGS) ×2 IMPLANT
DRSG PAD ABDOMINAL 8X10 ST (GAUZE/BANDAGES/DRESSINGS) ×2 IMPLANT
DURAPREP 26ML APPLICATOR (WOUND CARE) ×2 IMPLANT
ELECT REM PT RETURN 15FT ADLT (MISCELLANEOUS) ×2 IMPLANT
GAUZE SPONGE 4X4 12PLY STRL (GAUZE/BANDAGES/DRESSINGS) ×2 IMPLANT
GLENOID MOD AUG 4 PEG LT SZ 4 (Shoulder) ×2 IMPLANT
GLENOID MOD POST TM SZ2 (Post) ×2 IMPLANT
GLOVE BIO SURGEON STRL SZ7.5 (GLOVE) ×2 IMPLANT
GLOVE BIO SURGEON STRL SZ8 (GLOVE) ×2 IMPLANT
GLOVE BIOGEL PI IND STRL 8 (GLOVE) ×2 IMPLANT
GLOVE BIOGEL PI INDICATOR 8 (GLOVE) ×2
GOWN STRL REUS W/TWL 2XL LVL3 (GOWN DISPOSABLE) ×2 IMPLANT
GOWN STRL REUS W/TWL LRG LVL3 (GOWN DISPOSABLE) ×2 IMPLANT
HANDPIECE INTERPULSE COAX TIP (DISPOSABLE) ×2
HEAD HUMERAL BIPOLAR 47X24X46 (Miscellaneous) ×1 IMPLANT
HEAD HUMERAL COMP STD (Orthopedic Implant) ×1 IMPLANT
HOOD PEEL AWAY FLYTE STAYCOOL (MISCELLANEOUS) ×8 IMPLANT
HUMERAL HEAD BIPOLAR 47X24X46 (Miscellaneous) ×2 IMPLANT
HUMERAL HEAD COMP STD (Orthopedic Implant) ×2 IMPLANT
KIT BASIN OR (CUSTOM PROCEDURE TRAY) ×2 IMPLANT
KIT TURNOVER KIT A (KITS) IMPLANT
NS IRRIG 1000ML POUR BTL (IV SOLUTION) ×2 IMPLANT
PACK SHOULDER (CUSTOM PROCEDURE TRAY) ×2 IMPLANT
PAD COLD SHLDR WRAP-ON (PAD) ×2 IMPLANT
PENCIL SMOKE EVACUATOR (MISCELLANEOUS) IMPLANT
PIN STEINMANN THREADED TIP (PIN) ×2 IMPLANT
PIN THREADED REVERSE (PIN) ×2 IMPLANT
PROTECTOR NERVE ULNAR (MISCELLANEOUS) ×2 IMPLANT
REAMER GUIDE AUG PEG 4 LT (INSTRUMENTS) ×2 IMPLANT
RESTRAINT HEAD UNIVERSAL NS (MISCELLANEOUS) IMPLANT
SET HNDPC FAN SPRY TIP SCT (DISPOSABLE) ×1 IMPLANT
SLING ARM IMMOBILIZER LRG (SOFTGOODS) ×2 IMPLANT
SMARTMIX MINI TOWER (MISCELLANEOUS) ×2
STEM HUMERAL STRL 12MMX14MM (Stem) ×2 IMPLANT
SUCTION FRAZIER HANDLE 12FR (TUBING) ×1
SUCTION TUBE FRAZIER 12FR DISP (TUBING) ×1 IMPLANT
SUPPORT WRAP ARM LG (MISCELLANEOUS) ×2 IMPLANT
SUT FIBERWIRE #2 38 REV NDL BL (SUTURE) ×12
SUT VIC AB 1 CT1 36 (SUTURE) ×2 IMPLANT
SUT VIC AB 2-0 CT1 27 (SUTURE) ×2
SUT VIC AB 2-0 CT1 TAPERPNT 27 (SUTURE) ×1 IMPLANT
SUT VIC AB 3-0 SH 8-18 (SUTURE) ×2 IMPLANT
SUTURE FIBERWR#2 38 REV NDL BL (SUTURE) ×6 IMPLANT
TOWEL OR 17X26 10 PK STRL BLUE (TOWEL DISPOSABLE) ×2 IMPLANT
TOWEL OR NON WOVEN STRL DISP B (DISPOSABLE) ×2 IMPLANT
TOWER SMARTMIX MINI (MISCELLANEOUS) ×1 IMPLANT
WATER STERILE IRR 1000ML POUR (IV SOLUTION) ×4 IMPLANT

## 2020-03-04 NOTE — Discharge Instructions (Signed)
Diet: As you were doing prior to hospitalization   Shower:  May shower but keep the wounds dry, use an occlusive plastic wrap, NO SOAKING IN TUB.  If the bandage gets wet, change with a clean dry gauze.  Dressing:  You may change your dressing 3-5 days after surgery, unless you have a splint.  If you have a splint, then just leave the splint in place and we will change your bandages during your first follow-up appointment.    If you had hand or foot surgery, we will plan to remove your stitches in about 2 weeks in the office.  For all other surgeries, there are sticky tapes (steri-strips) on your wounds and all the stitches are absorbable.  Leave the steri-strips in place when changing your dressings, they will peel off with time, usually 2-3 weeks.  Activity:  Increase activity slowly as tolerated, but follow the weight bearing instructions below.  The rules on driving is that you can not be taking narcotics while you drive, and you must feel in control of the vehicle.    Weight Bearing:   Do not lift with left arm, keep in sling at all times.   To prevent constipation: you may use a stool softener such as -  Colace (over the counter) 100 mg by mouth twice a day  Drink plenty of fluids (prune juice may be helpful) and high fiber foods Miralax (over the counter) for constipation as needed.    Itching:  If you experience itching with your medications, try taking only a single pain pill, or even half a pain pill at a time.  You may take up to 10 pain pills per day, and you can also use benadryl over the counter for itching or also to help with sleep.   Precautions:  If you experience chest pain or shortness of breath - call 911 immediately for transfer to the hospital emergency department!!  If you develop a fever greater that 101 F, purulent drainage from wound, increased redness or drainage from wound, or calf pain -- Call the office at [phone]                                                 Follow- Up Appointment:  Please call for an appointment to be seen in 2 weeks [place] - [phone]  Dental Antibiotics:  In most cases prophylactic antibiotics for Dental procdeures after total joint surgery are not necessary.  Exceptions are as follows:  1. History of prior total joint infection  2. Severely immunocompromised (Organ Transplant, cancer chemotherapy, Rheumatoid biologic meds such as [place])  3. Poorly controlled diabetes (A1C &gt; 8.0, blood glucose over 200)  If you have one of these conditions, contact your surgeon for an antibiotic prescription, prior to your dental procedure.

## 2020-03-04 NOTE — Anesthesia Postprocedure Evaluation (Signed)
Anesthesia Post Note  Patient: Kyle Wilcox  Procedure(s) Performed: TOTAL SHOULDER ARTHROPLASTY (Left Shoulder)     Patient location during evaluation: PACU Anesthesia Type: General Level of consciousness: sedated and patient cooperative Pain management: pain level controlled Vital Signs Assessment: post-procedure vital signs reviewed and stable Respiratory status: spontaneous breathing Cardiovascular status: stable Anesthetic complications: no   No complications documented.  Last Vitals:  Vitals:   03/04/20 1100 03/04/20 1115  BP: 104/72 94/77  Pulse: 91   Resp: 14   Temp: 36.7 C 36.7 C  SpO2: 93% 93%    Last Pain:  Vitals:   03/04/20 1115  TempSrc:   PainSc: 0-No pain                 Nolon Nations

## 2020-03-04 NOTE — Anesthesia Procedure Notes (Signed)
Procedure Name: Intubation Date/Time: 03/04/2020 7:41 AM Performed by: Eben Burow, CRNA Pre-anesthesia Checklist: Patient identified, Emergency Drugs available, Suction available, Patient being monitored and Timeout performed Patient Re-evaluated:Patient Re-evaluated prior to induction Oxygen Delivery Method: Circle system utilized Preoxygenation: Pre-oxygenation with 100% oxygen Induction Type: IV induction Ventilation: Mask ventilation without difficulty Laryngoscope Size: Mac and 4 Grade View: Grade II Tube type: Oral Tube size: 7.5 mm Number of attempts: 1 Airway Equipment and Method: Stylet Placement Confirmation: ETT inserted through vocal cords under direct vision,  positive ETCO2 and breath sounds checked- equal and bilateral Secured at: 23 cm Tube secured with: Tape Dental Injury: Teeth and Oropharynx as per pre-operative assessment

## 2020-03-04 NOTE — Anesthesia Procedure Notes (Signed)
Anesthesia Regional Block: Interscalene brachial plexus block   Pre-Anesthetic Checklist: ,, timeout performed, Correct Patient, Correct Site, Correct Laterality, Correct Procedure, Correct Position, site marked, Risks and benefits discussed,  Surgical consent,  Pre-op evaluation,  At surgeon's request and post-op pain management  Laterality: Left  Prep: chloraprep       Needles:  Injection technique: Single-shot  Needle Type: Stimulator Needle - 40     Needle Length: 4cm  Needle Gauge: 22     Additional Needles:   Procedures:,,,, ultrasound used (permanent image in chart),,,,  Narrative:  Start time: 03/04/2020 6:56 AM End time: 03/04/2020 7:01 AM Injection made incrementally with aspirations every 5 mL.  Performed by: Personally  Anesthesiologist: Nolon Nations, MD  Additional Notes: BP cuff, EKG monitors applied. Sedation begun. Nerve location verified with U/S. Anesthetic injected incrementally, slowly , and after neg aspirations under direct u/s guidance. Good perineural spread. Tolerated well.

## 2020-03-04 NOTE — Transfer of Care (Signed)
Immediate Anesthesia Transfer of Care Note  Patient: [name]  Procedure(s) Performed: TOTAL SHOULDER ARTHROPLASTY (Left Shoulder)  Patient Location: PACU  Anesthesia Type:General  Level of Consciousness: awake, alert  and patient cooperative  Airway & Oxygen Therapy: Patient Spontanous Breathing and Patient connected to face mask oxygen  Post-op Assessment: Report given to RN and Post -op Vital signs reviewed and stable  Post vital signs: Reviewed and stable  Last Vitals:  Vitals Value Taken Time  BP 119/83 03/04/20 1034  Temp 37 C 03/04/20 1034  Pulse 93 03/04/20 1034  Resp 15 03/04/20 1034  SpO2 98 % 03/04/20 1034  Vitals shown include unvalidated device data.  Last Pain:  Vitals:   03/04/20 0630  TempSrc: Oral         Complications: No complications documented.

## 2020-03-04 NOTE — H&P (Signed)
PREOPERATIVE H&P  Chief Complaint: left shoulder pain  HPI: Kyle Wilcox is a 69 y.o. male who presents for preoperative history and physical with a diagnosis of left shoulder OA. Symptoms are rated as moderate to severe, and have been worsening.  This is significantly impairing activities of daily living.  He has elected for surgical management.   He has failed injections, activity modification, anti-inflammatories, and assistive devices.  Preoperative X-rays demonstrate end stage degenerative changes with osteophyte formation, loss of joint space, subchondral sclerosis.   Past Medical History:  Diagnosis Date  . Allergy 2014  . Arthritis    Phreesia 02/16/2020  . Cancer [facility]) 2015   prostate cancer  . Cataract 2016  . COVID-19 05/2019  . COVID-19 05/2019  . DDD (degenerative disc disease), lumbar 12/24/2014  . GERD (gastroesophageal reflux disease)   . Glaucoma   . Hx of colonic polyps 02/19/2011  . Hyperlipidemia 11/07/2014  . Peripheral neuropathy 12/24/2014   Past Surgical History:  Procedure Laterality Date  . COLONOSCOPY    . EYE SURGERY     laser surgery to relieve pressure on eyes- both eyes  . JOINT REPLACEMENT  2011   total shoulder replacement (Right)  . LAPAROTOMY N/A 01/06/2018   Procedure: EXPLORATORY LAPAROTOMY;  Surgeon: Judeth Horn, MD;  Location: [place];  Service: General;  Laterality: N/A;  . LYSIS OF ADHESION N/A 01/06/2018   Procedure: LYSIS OF ADHESION;  Surgeon: Judeth Horn, MD;  Location: [place];  Service: General;  Laterality: N/A;  . PROSTATE SURGERY     2015  . SMALL INTESTINE SURGERY N/A    Phreesia 02/16/2020  . TONSILLECTOMY    . VASECTOMY  1982   Social History   Socioeconomic History  . Marital status: Married    Spouse name: Not on file  . Number of children: Not on file  . Years of education: Not on file  . Highest education level: Not on file  Occupational History  . Not on file  Tobacco Use  . Smoking status: Former Smoker     Quit date: 03/09/1981    Years since quitting: 39.0  . Smokeless tobacco: Never Used  Vaping Use  . Vaping Use: Never used  Substance and Sexual Activity  . Alcohol use: Yes    Alcohol/week: 2.0 standard drinks    Types: 2 Cans of beer per week    Comment: occasionally  . Drug use: No  . Sexual activity: Not Currently  Other Topics Concern  . Not on file  Social History Narrative  . Not on file   Social Determinants of Health   Financial Resource Strain:   . Difficulty of Paying Living Expenses: Not on file  Food Insecurity:   . Worried About Charity fundraiser in the Last Year: Not on file  . Ran Out of Food in the Last Year: Not on file  Transportation Needs:   . Lack of Transportation (Medical): Not on file  . Lack of Transportation (Non-Medical): Not on file  Physical Activity:   . Days of Exercise per Week: Not on file  . Minutes of Exercise per Session: Not on file  Stress:   . Feeling of Stress : Not on file  Social Connections:   . Frequency of Communication with Friends and Family: Not on file  . Frequency of Social Gatherings with Friends and Family: Not on file  . Attends Religious Services: Not on file  . Active Member of Clubs or Organizations: Not  on file  . Attends Archivist Meetings: Not on file  . Marital Status: Not on file   Family History  Problem Relation Age of Onset  . Arthritis Mother   . Heart disease Mother   . Hyperlipidemia Mother   . Hypertension Mother   . Stroke Mother   . Vision loss Mother   . Arthritis Father   . Depression Father   . Heart disease Father   . Hyperlipidemia Father   . Stroke Father   . Arthritis Sister   . Cancer Sister   . Diabetes Sister   . Hyperlipidemia Sister   . Arthritis Brother   . Diabetes Brother   . Heart disease Brother   . Hyperlipidemia Brother   . Hypertension Brother   . Stomach cancer Maternal Grandfather   . Colon cancer Neg Hx   . Esophageal cancer Neg Hx   . Rectal  cancer Neg Hx    No Known Allergies Prior to Admission medications   Medication Sig Start Date End Date Taking? Authorizing Provider  acetaminophen (TYLENOL) 500 MG tablet Take 500-1,000 mg by mouth every 6 (six) hours as needed for moderate pain or headache.   Yes [provider]  atorvastatin (LIPITOR) 40 MG tablet Take 1 tablet (40 mg total) by mouth daily at 6 PM. 12/18/19  Yes Pickard, Cammie Mcgee, MD  diphenhydrAMINE (BENADRYL) 25 MG tablet Take 25 mg by mouth See admin instructions. Take 25 mg daily, may take a second 25 mg dose as needed for allergies   Yes [provider]  dorzolamide-timolol (COSOPT) 22.3-6.8 MG/ML ophthalmic solution Place 1 drop into both eyes 2 (two) times daily.  09/19/18  Yes [provider]  FIBER PO Take 1 capsule by mouth 2 (two) times daily.   Yes [provider]  Fluticasone Propionate (ALLERGY RELIEF NA) Place 1 spray into the nose daily as needed (allergies).   Yes [provider]  gabapentin (NEURONTIN) 600 MG tablet Take 1 tablet (600 mg total) by mouth 2 (two) times daily. 10/08/19  Yes PickardCammie Mcgee, MD  Latanoprostene Bunod (VYZULTA) 0.024 % SOLN Place 1 drop into both eyes at bedtime.    Yes [provider]  Melatonin 3 MG CAPS Take 3 mg by mouth at bedtime as needed (sleep).   Yes [provider]  Menthol, Topical Analgesic, (BIOFREEZE EX) Apply 1 application topically daily as needed (pain).   Yes [provider]  Omega-3 Fatty Acids (OMEGA-3 FISH OIL) 300 MG CAPS Take 300 mg by mouth daily.   Yes [provider]  ONE DAILY MULTIPLE VITAMIN PO Take 1 tablet by mouth daily.    Yes [provider]  Simethicone (GAS-X PO) Take 1 tablet by mouth daily as needed (gas).   Yes [provider]  traZODone (DESYREL) 50 MG tablet Take 0.5-1 tablets (25-50 mg total) by mouth at bedtime as needed for sleep. 07/06/19  Yes Susy Frizzle, MD  vitamin C (ASCORBIC ACID)  500 MG tablet Take 500 mg by mouth daily.   Yes [provider]  aspirin (ASPIR-81) 81 MG EC tablet Take 81 mg by mouth daily. Swallow whole.    [provider]  meloxicam (MOBIC) 7.5 MG tablet Take 1 tablet (7.5 mg total) by mouth daily. Patient not taking: Reported on 02/13/2020 09/27/18   Delsa Grana, PA-C     Positive ROS: All other systems have been reviewed and were otherwise negative with the exception of those mentioned  in the HPI and as above.  Physical Exam: General: Alert, no acute distress Cardiovascular: No pedal edema Respiratory: No cyanosis, no use of accessory musculature GI: No organomegaly, abdomen is soft and non-tender Skin: No lesions in the area of chief complaint Neurologic: Sensation intact distally Psychiatric: Patient is competent for consent with normal mood and affect Lymphatic: No axillary or cervical lymphadenopathy  MUSCULOSKELETAL: left shoulder AROM 0-140 with crepitance, ER to neutral, positive painful arc  Assessment: Left shoulder primary localized osteoarthritis   Plan: Plan for Procedure(s): TOTAL SHOULDER ARTHROPLASTY  The risks benefits and alternatives were discussed with the patient including but not limited to the risks of nonoperative treatment, versus surgical intervention including infection, bleeding, nerve injury,  blood clots, cardiopulmonary complications, morbidity, mortality, among others, and they were willing to proceed.    Patient's anticipated LOS is less than 2 midnights, meeting these requirements: - Younger than 61 - Lives within 1 hour of care - Has a competent adult at home to recover with post-op recover - NO history of  - Chronic pain requiring opiods  - Diabetes  - Coronary Artery Disease  - Heart failure  - Heart attack  - Stroke  - DVT/VTE  - Cardiac arrhythmia  - Respiratory Failure/COPD  - Renal failure  - Anemia  - Advanced Liver disease        Johnny Bridge, MD Cell [phone]   03/04/2020 7:23 AM

## 2020-03-04 NOTE — Op Note (Signed)
03/04/2020  10:06 AM  PATIENT:  Kyle Wilcox    PRE-OPERATIVE DIAGNOSIS:  Left shoulder primary localized osteoarthritis with severe wear   POST-OPERATIVE DIAGNOSIS:  Same  PROCEDURE:  LEFT Total Shoulder Arthroplasty  SURGEON:  Johnny Bridge, MD  PHYSICIAN ASSISTANT: Merlene Pulling, PA-C, present and scrubbed throughout the case, critical for completion in a timely fashion, and for retraction, instrumentation, and closure.  ANESTHESIA:   General with an interscalene block  ESTIMATED BLOOD LOSS: 150 ml  UNIQUE ASPECTS OF THE CASE: There was severe erosive posterior wear requiring an augment.  Bone quality was good.  I had all 4 holes contained within the vault.  His rotator cuff is intact.  Access to the glenoid was extremely difficult, cut the humerus twice in order to be able to have access.  PREOPERATIVE INDICATIONS:  Kyle Wilcox is a  69 y.o. male who failed conservative measures and elected for surgical management.    The risks benefits and alternatives were discussed with the patient preoperatively including but not limited to the risks of infection, bleeding, nerve injury, cardiopulmonary complications, the need for revision surgery, dislocation, loosening, incomplete relief of pain, among others, and the patient was willing to proceed.   OPERATIVE IMPLANTS: Biomet size 12 micro press-fit humeral stem, size 4 Alliance Glenoid with a posterior augment polyethylene 3 peg implant with a central regenerex noncemented post, Versa-dial humeral head 46 x 24, set in the E position with increased coverage posterior superiorly.   OPERATIVE FINDINGS: Advanced glenohumeral osteoarthritis involving the glenoid and the humeral head with substantial osteophyte formation inferiorly with posterior wear.   OPERATIVE PROCEDURE: The patient was brought to the operating room and placed in the supine position. General anesthesia was administered. IV antibiotics were given.  The upper extremity  was prepped and draped in usual sterile fashion. The patient was in a beachchair position with all bony prominences padded.   Time out was performed and a deltopectoral approach was carried out. The biceps tendon was tenodesed to the pectoralis tendon. The subscapularis was released, tagging it with a #2 FiberWire, leaving a cuff of tendon for repair.   The inferior osteophyte was removed, and release of the capsule off of the humeral side was completed. The head was dislocated, and I reamed sequentially. I placed the humeral cutting guide at 30 of retroversion, and then pinned this into place, and made my humeral neck cut. This was at the appropriate level.   I then placed deep retractors and exposed the glenoid. I excised the labrum circumferentially, taking care to protect the axillary nerve inferiorly. Access was limited so I cut the humerus again.  There was still significant posterior wear to contend with.    I then placed a guidewire into the center position, controlling appropriate version and inclination. I then reamed over the guidewire with the small reamer, and was satisfied with the preparation. I preserved the subchondral bone in order to maximize the strength and minimize the risk for subsequent subsidence.  I prepared the central peg screw as well.  I used the anterior protector to place the reamer posteriorly and then prepared the posterior surface, barely scratching that surface.  I then drilled and then drilled the 3 peripheral peg holes. I had excellent bony circumferential contact.    I then cleaned the glenoid, irrigated it copiously, and then dried it and cemented the prosthesis into place. Excellent seating was achieved. I had full exposure. The cement cured while I turned my  attention to the humeral side.   I sequentially broached, up to the selected size, with the broach set at 30 of retroversion. I placed 3 #2 Fiberwire through the bone for subsequent repair.  I then placed  the real stem. I trialed with multiple heads, and the above-named component was selected. Increased posterior coverage improved the coverage. The soft tissue tension was appropriate.   I then impacted the real humeral head into place, reduced the head, and irrigated copiously. Excellent stability and range of motion was achieved. I repaired the subscapularis with a total of 5 #2 FiberWire; one for the interval, one for the corner, and then the remaining three from the lesser tuberosity which had already been passed.  Excellent repair achieved and I irrigated copiously once more. The subcutaneous tissue was closed with Vicryl including the deltopectoral fascia.   The skin was closed with Steri-Strips and sterile gauze was applied. He had a preoperative nerve block. He tolerated the procedure well and there were no complications.

## 2020-03-04 NOTE — Evaluation (Signed)
Occupational Therapy Evaluation Patient Details Name: Kyle Wilcox MRN: [id number] DOB: 1951-05-16 Today's Date: 03/04/2020    History of Present Illness Patient is a 69 year old male admitted 10/19 for L total shoulder arthroplasty. PMH includes prostate CA, COVID, DDD and glaucoma    Clinical Impression   s/p shoulder replacement without functional use of left non dominant upper extremity secondary to effects of surgery and interscalene block and shoulder precautions. Therapist provided education and instruction to patient and spouse in regards to exercises, precautions, positioning, donning upper extremity clothing and bathing while maintaining shoulder precautions, ice and edema management and donning/doffing sling. Patient and spouse verbalized understanding and demonstrated as needed. Patient needed assistance to donn shirt, underwear, pants, socks and shoes and provided with instruction on compensatory strategies to perform ADLs. Patient to follow up with MD for further therapy needs.      Follow Up Recommendations  Follow surgeon's recommendation for DC plan and follow-up therapies    Equipment Recommendations  None recommended by OT       Precautions / Restrictions Precautions Precautions: Shoulder Type of Shoulder Precautions: AROM elbow, wrist, hand ok Shoulder Interventions: Shoulder sling/immobilizer;Off for dressing/bathing/exercises Precaution Booklet Issued: Yes (comment) Required Braces or Orthoses: Sling Restrictions Weight Bearing Restrictions: Yes LUE Weight Bearing: Non weight bearing      Mobility Bed Mobility               General bed mobility comments: in chair  Transfers Overall transfer level: Needs assistance Equipment used: None Transfers: Sit to/from Stand Sit to Stand: Supervision         General transfer comment: S for safety    Balance Overall balance assessment: Mild deficits observed, not formally tested                                          ADL either performed or assessed with clinical judgement   ADL Overall ADL's : Needs assistance/impaired Eating/Feeding: Independent;Sitting Eating/Feeding Details (indicate cue type and reason): drink from cup Grooming: Independent   Upper Body Bathing: Minimal assistance;Sitting   Lower Body Bathing: Moderate assistance;Sitting/lateral leans;Sit to/from stand   Upper Body Dressing : Moderate assistance;Cueing for sequencing;Cueing for compensatory techniques;Cueing for UE precautions;Sitting Upper Body Dressing Details (indicate cue type and reason): assist to thread L UE d/t nerve block and safely get R UE through d/t IV Lower Body Dressing: Moderate assistance;Sitting/lateral leans;Sit to/from stand Lower Body Dressing Details (indicate cue type and reason): patient's spouse assist with initaiting threading LEs and min A to pull up pants over L hip Toilet Transfer: Supervision/safety Toilet Transfer Details (indicate cue type and reason): with sit to stand from chair Toileting- Clothing Manipulation and Hygiene: Minimal assistance       Functional mobility during ADLs: Supervision/safety General ADL Comments: patient and spouse educated in shoulder precautions and how to adhere during self care tasks, educated in compensatory strategies                   Pertinent Vitals/Pain Pain Assessment: Faces Faces Pain Scale: Hurts a little bit Pain Location: L shoulder Pain Descriptors / Indicators: Numbness Pain Intervention(s): Monitored during session     Hand Dominance Right   Extremity/Trunk Assessment Upper Extremity Assessment Upper Extremity Assessment: LUE deficits/detail LUE Deficits / Details: + nerve block, AROM digits WFL    Lower Extremity Assessment Lower Extremity Assessment: Overall [facility]  for tasks assessed       Communication Communication Communication: No difficulties   Cognition Arousal/Alertness:  Awake/alert Behavior During Therapy: WFL for tasks assessed/performed Overall Cognitive Status: Within Functional Limits for tasks assessed                                        Exercises Exercises: Shoulder   Shoulder Instructions Shoulder Instructions Donning/doffing shirt without moving shoulder: Moderate assistance;Caregiver independent with task;Patient able to independently direct caregiver Method for sponge bathing under operated UE: Minimal assistance;Caregiver independent with task;Patient able to independently direct caregiver Donning/doffing sling/immobilizer: Moderate assistance;Caregiver independent with task;Patient able to independently direct caregiver Correct positioning of sling/immobilizer: Caregiver independent with task;Patient able to independently direct caregiver Pendulum exercises (written home exercise program):  (N/A) ROM for elbow, wrist and digits of operated UE: Patient able to independently direct caregiver;Caregiver independent with task Sling wearing schedule (on at all times/off for ADL's): Caregiver independent with task;Patient able to independently direct caregiver Proper positioning of operated UE when showering: Minimal assistance;Patient able to independently direct caregiver;Caregiver independent with task Positioning of UE while sleeping: Caregiver independent with task;Patient able to independently direct caregiver    Home Living Family/patient expects to be discharged to:: Private residence Living Arrangements: Spouse/significant other Available Help at Discharge: Family Type of Home: House Home Access: Level entry     [place]: One level     Bathroom Shower/Tub: Occupational psychologist: Handicapped height     [place]: [place] - built in;Grab bars - tub/shower          Prior Functioning/Environment Level of Independence: Independent                 OT Problem List: Pain;Impaired UE  functional use         OT Goals(Current goals can be found in the care plan section) Acute Rehab OT Goals Patient Stated Goal: go home OT Goal Formulation: With patient   AM-PAC OT "6 Clicks" Daily Activity     Outcome Measure Help from another person eating meals?: None Help from another person taking care of personal grooming?: A Little Help from another person toileting, which includes using toliet, bedpan, or urinal?: A Little Help from another person bathing (including washing, rinsing, drying)?: A Lot Help from another person to put on and taking off regular upper body clothing?: A Lot Help from another person to put on and taking off regular lower body clothing?: A Lot 6 Click Score: 16   End of Session Equipment Utilized During Treatment: Other (comment) (sling) Nurse Communication: Mobility status  Activity Tolerance: Patient tolerated treatment well Patient left: in chair;with call bell/phone within reach;with family/visitor present  OT Visit Diagnosis: Pain Pain - Right/Left: Left Pain - part of body: Shoulder                Time: 1497-0263 OT Time Calculation (min): 38 min Charges:  OT General Charges $OT Visit: 1 Visit OT Evaluation $OT Eval Low Complexity: 1 Low OT Treatments $Self Care/Home Management : 23-37 mins  Delbert Phenix OT OT pager: [place] 03/04/2020, 1:00 PM

## 2020-03-05 ENCOUNTER — Encounter (HOSPITAL_COMMUNITY): Payer: Self-pay | Admitting: Orthopedic Surgery

## 2020-03-17 DIAGNOSIS — M19012 Primary osteoarthritis, left shoulder: Secondary | ICD-10-CM | POA: Diagnosis not present

## 2020-04-04 DIAGNOSIS — Z23 Encounter for immunization: Secondary | ICD-10-CM | POA: Diagnosis not present

## 2020-04-14 DIAGNOSIS — L570 Actinic keratosis: Secondary | ICD-10-CM | POA: Diagnosis not present

## 2020-04-14 DIAGNOSIS — L579 Skin changes due to chronic exposure to nonionizing radiation, unspecified: Secondary | ICD-10-CM | POA: Diagnosis not present

## 2020-04-14 DIAGNOSIS — L820 Inflamed seborrheic keratosis: Secondary | ICD-10-CM | POA: Diagnosis not present

## 2020-04-14 DIAGNOSIS — L821 Other seborrheic keratosis: Secondary | ICD-10-CM | POA: Diagnosis not present

## 2020-04-16 DIAGNOSIS — M25612 Stiffness of left shoulder, not elsewhere classified: Secondary | ICD-10-CM | POA: Diagnosis not present

## 2020-04-21 DIAGNOSIS — M25612 Stiffness of left shoulder, not elsewhere classified: Secondary | ICD-10-CM | POA: Diagnosis not present

## 2020-04-23 DIAGNOSIS — M25612 Stiffness of left shoulder, not elsewhere classified: Secondary | ICD-10-CM | POA: Diagnosis not present

## 2020-04-28 DIAGNOSIS — M25612 Stiffness of left shoulder, not elsewhere classified: Secondary | ICD-10-CM | POA: Diagnosis not present

## 2020-04-30 DIAGNOSIS — M25612 Stiffness of left shoulder, not elsewhere classified: Secondary | ICD-10-CM | POA: Diagnosis not present

## 2020-05-05 DIAGNOSIS — M25612 Stiffness of left shoulder, not elsewhere classified: Secondary | ICD-10-CM | POA: Diagnosis not present

## 2020-05-07 DIAGNOSIS — M25612 Stiffness of left shoulder, not elsewhere classified: Secondary | ICD-10-CM | POA: Diagnosis not present

## 2020-05-13 DIAGNOSIS — M25612 Stiffness of left shoulder, not elsewhere classified: Secondary | ICD-10-CM | POA: Diagnosis not present

## 2020-05-14 DIAGNOSIS — M19012 Primary osteoarthritis, left shoulder: Secondary | ICD-10-CM | POA: Diagnosis not present

## 2020-05-15 DIAGNOSIS — M25612 Stiffness of left shoulder, not elsewhere classified: Secondary | ICD-10-CM | POA: Diagnosis not present

## 2020-05-20 DIAGNOSIS — M25612 Stiffness of left shoulder, not elsewhere classified: Secondary | ICD-10-CM | POA: Diagnosis not present

## 2020-05-22 DIAGNOSIS — M25612 Stiffness of left shoulder, not elsewhere classified: Secondary | ICD-10-CM | POA: Diagnosis not present

## 2020-05-27 DIAGNOSIS — M25612 Stiffness of left shoulder, not elsewhere classified: Secondary | ICD-10-CM | POA: Diagnosis not present

## 2020-05-29 DIAGNOSIS — M25612 Stiffness of left shoulder, not elsewhere classified: Secondary | ICD-10-CM | POA: Diagnosis not present

## 2020-06-04 DIAGNOSIS — H401213 Low-tension glaucoma, right eye, severe stage: Secondary | ICD-10-CM | POA: Diagnosis not present

## 2020-06-09 DIAGNOSIS — L570 Actinic keratosis: Secondary | ICD-10-CM | POA: Diagnosis not present

## 2020-06-09 DIAGNOSIS — M19012 Primary osteoarthritis, left shoulder: Secondary | ICD-10-CM | POA: Diagnosis not present

## 2020-06-16 DIAGNOSIS — M25612 Stiffness of left shoulder, not elsewhere classified: Secondary | ICD-10-CM | POA: Diagnosis not present

## 2020-07-30 ENCOUNTER — Other Ambulatory Visit: Payer: Self-pay | Admitting: Family Medicine

## 2020-08-11 DIAGNOSIS — M19012 Primary osteoarthritis, left shoulder: Secondary | ICD-10-CM | POA: Diagnosis not present

## 2020-09-02 ENCOUNTER — Encounter: Payer: Self-pay | Admitting: Podiatry

## 2020-09-02 ENCOUNTER — Ambulatory Visit (INDEPENDENT_AMBULATORY_CARE_PROVIDER_SITE_OTHER): Payer: Medicare Other | Admitting: Podiatry

## 2020-09-02 ENCOUNTER — Other Ambulatory Visit: Payer: Self-pay | Admitting: Podiatry

## 2020-09-02 ENCOUNTER — Ambulatory Visit (INDEPENDENT_AMBULATORY_CARE_PROVIDER_SITE_OTHER): Payer: Medicare Other

## 2020-09-02 ENCOUNTER — Other Ambulatory Visit: Payer: Self-pay

## 2020-09-02 DIAGNOSIS — M778 Other enthesopathies, not elsewhere classified: Secondary | ICD-10-CM | POA: Diagnosis not present

## 2020-09-02 DIAGNOSIS — M76822 Posterior tibial tendinitis, left leg: Secondary | ICD-10-CM | POA: Diagnosis not present

## 2020-09-02 DIAGNOSIS — M76812 Anterior tibial syndrome, left leg: Secondary | ICD-10-CM

## 2020-09-02 DIAGNOSIS — M722 Plantar fascial fibromatosis: Secondary | ICD-10-CM

## 2020-09-02 MED ORDER — TRIAMCINOLONE ACETONIDE 40 MG/ML IJ SUSP
20.0000 mg | Freq: Once | INTRAMUSCULAR | Status: AC
  Administered 2020-09-02: 20 mg

## 2020-09-02 MED ORDER — METHYLPREDNISOLONE 4 MG PO TBPK: ORAL_TABLET | ORAL | 0 refills | Status: DC

## 2020-09-02 MED ORDER — MELOXICAM 15 MG PO TABS: 15.0000 mg | ORAL_TABLET | Freq: Every day | ORAL | 3 refills | Status: DC

## 2020-09-03 NOTE — Progress Notes (Signed)
Subjective:  Patient ID: Kyle Wilcox, male    DOB: [birth year],  MRN: [id number] HPI Chief Complaint  Patient presents with  . Ankle Pain    Medial ankle left - weakness x few weeks, tried OTC inserts-helps some, also noticed off and on discomfort lateral side of foot  . New Patient (Initial Visit)    Est pt 2017    70 y.o. male presents with the above complaint.   ROS: Denies fever chills nausea vomiting muscle aches pains calf pain back pain chest pain shortness of breath.  Past Medical History:  Diagnosis Date  . Allergy 2014  . Arthritis    Phreesia 02/16/2020  . Cancer [facility]) 2015   prostate cancer  . Cataract 2016  . COVID-19 05/2019  . COVID-19 05/2019  . DDD (degenerative disc disease), lumbar 12/24/2014  . GERD (gastroesophageal reflux disease)   . Glaucoma   . Hx of colonic polyps 02/19/2011  . Hyperlipidemia 11/07/2014  . Peripheral neuropathy 12/24/2014   Past Surgical History:  Procedure Laterality Date  . COLONOSCOPY    . EYE SURGERY     laser surgery to relieve pressure on eyes- both eyes  . JOINT REPLACEMENT  2011   total shoulder replacement (Right)  . LAPAROTOMY N/A 01/06/2018   Procedure: EXPLORATORY LAPAROTOMY;  Surgeon: Judeth Horn, MD;  Location: [place];  Service: General;  Laterality: N/A;  . LYSIS OF ADHESION N/A 01/06/2018   Procedure: LYSIS OF ADHESION;  Surgeon: Judeth Horn, MD;  Location: [place];  Service: General;  Laterality: N/A;  . PROSTATE SURGERY     2015  . SMALL INTESTINE SURGERY N/A    Phreesia 02/16/2020  . TONSILLECTOMY    . TOTAL SHOULDER ARTHROPLASTY Left 03/04/2020   Procedure: TOTAL SHOULDER ARTHROPLASTY;  Surgeon: Marchia Bond, MD;  Location: [facility];  Service: Orthopedics;  Laterality: Left;  Marland Kitchen VASECTOMY  1982    Current Outpatient Medications:  .  meloxicam (MOBIC) 15 MG tablet, Take 1 tablet (15 mg total) by mouth daily., Disp: 30 tablet, Rfl: 3 .  methylPREDNISolone (MEDROL DOSEPAK) 4 MG TBPK tablet, 6 day dose pack -  take as directed, Disp: 21 tablet, Rfl: 0 .  aspirin (ASPIR-81) 81 MG EC tablet, Take 81 mg by mouth daily. Swallow whole., Disp: , Rfl:  .  atorvastatin (LIPITOR) 40 MG tablet, Take 1 tablet (40 mg total) by mouth daily at 6 PM., Disp: 90 tablet, Rfl: 2 .  diphenhydrAMINE (BENADRYL) 25 MG tablet, Take 25 mg by mouth See admin instructions. Take 25 mg daily, may take a second 25 mg dose as needed for allergies, Disp: , Rfl:  .  dorzolamide-timolol (COSOPT) 22.3-6.8 MG/ML ophthalmic solution, Place 1 drop into both eyes 2 (two) times daily. , Disp: , Rfl:  .  FIBER PO, Take 1 capsule by mouth 2 (two) times daily., Disp: , Rfl:  .  gabapentin (NEURONTIN) 600 MG tablet, Take 1 tablet (600 mg total) by mouth 2 (two) times daily., Disp: 180 tablet, Rfl: 3 .  Latanoprostene Bunod (VYZULTA) 0.024 % SOLN, Place 1 drop into both eyes at bedtime. , Disp: , Rfl:  .  Omega-3 Fatty Acids (OMEGA-3 FISH OIL) 300 MG CAPS, Take 300 mg by mouth daily., Disp: , Rfl:  .  ONE DAILY MULTIPLE VITAMIN PO, Take 1 tablet by mouth daily. , Disp: , Rfl:  .  vitamin C (ASCORBIC ACID) 500 MG tablet, Take 500 mg by mouth daily., Disp: , Rfl:   No Known Allergies  Review of Systems Objective:  There were no vitals filed for this visit.  General: Well developed, nourished, in no acute distress, alert and oriented x3   Dermatological: Skin is warm, dry and supple bilateral. Nails x 10 are well maintained; remaining integument appears unremarkable at this time. There are no open sores, no preulcerative lesions, no rash or signs of infection present.  Vascular: Dorsalis Pedis artery and Posterior Tibial artery pedal pulses are 2/4 bilateral with immedate capillary fill time. Pedal hair growth present. No varicosities and no lower extremity edema present bilateral.   Neruologic: Grossly intact via light touch bilateral. Vibratory intact via tuning fork bilateral. Protective threshold with Semmes Wienstein monofilament intact to  all pedal sites bilateral. Patellar and Achilles deep tendon reflexes 2+ bilateral. No Babinski or clonus noted bilateral.   Musculoskeletal: No gross boney pedal deformities bilateral. No pain, crepitus, or limitation noted with foot and ankle range of motion bilateral. Muscular strength 5/5 in all groups tested bilateral.  Tenderness on palpation of the medial calcaneal tubercle of the left heel.  He has tenderness on palpation with some fluctuance at the posterior tibial tendon navicular insertion he also has some tenderness on palpation of the tibialis anterior insertion.  Both of these seem to be consistent with chronic proximal plantar fasciitis.  Compensation.  Gait: Unassisted, Nonantalgic.    Radiographs:  Radiographs taken today demonstrate osseously mature individual no acute findings otherwise.  He still does have some soft tissue increase in density plantar fascial cannula insertion site and some thickening of the posterior tibial tendon  Assessment & Plan:   Assessment: Posterior tibial tendinitis tibialis anterior tendinitis secondary to chronic proximal Planter fasciitis left.  Plan: I injected the left heel today 20 mg Kenalog 5 mg Marcaine point of maximal tenderness.  Start him on methylprednisolone to be followed by meloxicam.  Discussed appropriate shoe gear stretching exercise ice therapy shoe gear modifications.       Denesha Brouse T. [place], [place]

## 2020-10-14 ENCOUNTER — Encounter: Payer: Self-pay | Admitting: Podiatry

## 2020-10-14 ENCOUNTER — Ambulatory Visit (INDEPENDENT_AMBULATORY_CARE_PROVIDER_SITE_OTHER): Payer: Medicare Other | Admitting: Podiatry

## 2020-10-14 ENCOUNTER — Other Ambulatory Visit: Payer: Self-pay

## 2020-10-14 ENCOUNTER — Other Ambulatory Visit: Payer: Self-pay | Admitting: Family Medicine

## 2020-10-14 DIAGNOSIS — M722 Plantar fascial fibromatosis: Secondary | ICD-10-CM

## 2020-10-14 DIAGNOSIS — B351 Tinea unguium: Secondary | ICD-10-CM | POA: Diagnosis not present

## 2020-10-14 DIAGNOSIS — M76812 Anterior tibial syndrome, left leg: Secondary | ICD-10-CM

## 2020-10-14 DIAGNOSIS — E782 Mixed hyperlipidemia: Secondary | ICD-10-CM

## 2020-10-14 DIAGNOSIS — M76822 Posterior tibial tendinitis, left leg: Secondary | ICD-10-CM

## 2020-10-14 DIAGNOSIS — L603 Nail dystrophy: Secondary | ICD-10-CM

## 2020-10-14 NOTE — Progress Notes (Signed)
He presents today for follow-up of his Planter fasciitis posterior tibial tendinitis and tibialis anterior tendinitis of his left foot.  States that it feels a little better I believe.  When asked how much he states that about 90%.  He is also concerned about the discoloration of his toenails.  Objective: Vital signs are stable he is alert and oriented x3.  Pulses are palpable.  He has no reproducible pain on palpation of the plantar fascia of the anterior tibialis tendon or the posterior tibialis tendon.  Toenails are thick and dystrophic with distal onycholysis.  Assessment: Well-healing tendinitis and Planter fasciitis left.  Nail dystrophy.  Plan: Samples of the skin and nail were taken today.  I encouraged him to continue his anti-inflammatories I will follow-up with me in 1 month if his tendinitis progresses or recurs then we will seek an MRI.

## 2020-11-12 ENCOUNTER — Encounter: Payer: Self-pay | Admitting: Family Medicine

## 2020-11-12 MED ORDER — TRAZODONE HCL 50 MG PO TABS
ORAL_TABLET | ORAL | 1 refills | Status: DC
Start: 2020-11-12 — End: 2020-12-08

## 2020-11-13 ENCOUNTER — Ambulatory Visit (INDEPENDENT_AMBULATORY_CARE_PROVIDER_SITE_OTHER): Payer: Medicare Other | Admitting: Podiatry

## 2020-11-13 ENCOUNTER — Encounter: Payer: Self-pay | Admitting: Podiatry

## 2020-11-13 ENCOUNTER — Other Ambulatory Visit: Payer: Self-pay

## 2020-11-13 DIAGNOSIS — Z79899 Other long term (current) drug therapy: Secondary | ICD-10-CM | POA: Diagnosis not present

## 2020-11-13 DIAGNOSIS — L603 Nail dystrophy: Secondary | ICD-10-CM | POA: Diagnosis not present

## 2020-11-13 MED ORDER — TERBINAFINE HCL 250 MG PO TABS
250.0000 mg | ORAL_TABLET | Freq: Every day | ORAL | 0 refills | Status: DC
Start: 2020-11-13 — End: 2021-01-15

## 2020-11-13 NOTE — Progress Notes (Signed)
He presents today chief complaint of painful toenails.  Objective: Pathology results do demonstrate Trichophyton rubrum.  Assessment: Trichophyton rubrum to the toenails with onychomycosis.  Plan: Discussed pros and cons of topical oral and laser therapies.  At this point topical therapy was chosen.  We will start him on that once we have completed a complete metabolic panel to evaluate the kidneys and the liver.  Also start him his first 30 days of Lamisil 250 mg tablets 1 tablet p.o. daily.  Follow-up with him in 1 month.

## 2020-11-13 NOTE — Patient Instructions (Signed)
Terbinafine tablets What is this medication? TERBINAFINE (TER bin a feen) is an antifungal medicine. It is used to treatcertain kinds of fungal or yeast infections. This medicine may be used for other purposes; ask your health care provider orpharmacist if you have questions. COMMON BRAND NAME(S): Lamisil, Terbinex What should I tell my care team before I take this medication? They need to know if you have any of these conditions: drink alcoholic beverages kidney disease liver disease an unusual or allergic reaction to terbinafine, other medicines, foods, dyes, or preservatives pregnant or trying to get pregnant breast-feeding How should I use this medication? Take this medicine by mouth with a full glass of water. Follow the directions on the prescription label. You can take this medicine with food or on an empty stomach. Take your medicine at regular intervals. Do not take your medicine more often than directed. Do not skip doses or stop your medicine early even ifyou feel better. Do not stop taking except on your doctor's advice. A special MedGuide will be given to you by the pharmacist with eachprescription and refill. Be sure to read this information carefully each time. Talk to your pediatrician regarding the use of this medicine in children.Special care may be needed. Overdosage: If you think you have taken too much of this medicine contact apoison control center or emergency room at once. NOTE: This medicine is only for you. Do not share this medicine with others. What if I miss a dose? If you miss a dose, take it as soon as you can. If it is almost time for yournext dose, take only that dose. Do not take double or extra doses. What may interact with this medication? Do not take this medicine with any of the following medications: thioridazine This medicine may also interact with the following medications: beta-blockers caffeine cimetidine cyclosporine medicines for depression,  anxiety, or psychotic disturbances medicines for fungal infections like fluconazole and ketoconazole medicines for irregular heartbeat like amiodarone, flecainide and propafenone rifampin warfarin This list may not describe all possible interactions. Give your health care provider a list of all the medicines, herbs, non-prescription drugs, or dietary supplements you use. Also tell them if you smoke, drink alcohol, or use illegaldrugs. Some items may interact with your medicine. What should I watch for while using this medication? Visit your doctor or health care provider regularly. Tell your doctor right away if you have nausea or vomiting, loss of appetite, stomach pain on your right upper side, yellow skin, dark urine, light stools, or are over tired. Some fungal infections need many weeks or months of treatment to cure. If you are taking this medicine for a long time, you will need to have important bloodwork done. This medicine may cause serious skin reactions. They can happen weeks to months after starting the medicine. Contact your health care provider right away if you notice fevers or flu-like symptoms with a rash. The rash may be red or purple and then turn into blisters or peeling of the skin. Or, you might notice a red rash with swelling of the face, lips or lymph nodes in your neck or underyour arms. What side effects may I notice from receiving this medication? Side effects that you should report to your doctor or health care professionalas soon as possible: allergic reactions like skin rash or hives, swelling of the face, lips, or tongue changes in vision dark urine fever or infection general ill feeling or flu-like symptoms light-colored stools loss of appetite, nausea rash, fever,   and swollen lymph nodes redness, blistering, peeling or loosening of the skin, including inside the mouth right upper belly pain unusually weak or tired yellowing of the eyes or skin Side effects that  usually do not require medical attention (report to yourdoctor or health care professional if they continue or are bothersome): changes in taste diarrhea hair loss muscle or joint pain stomach gas stomach upset This list may not describe all possible side effects. Call your doctor for medical advice about side effects. You may report side effects to FDA at1-800-FDA-1088. Where should I keep my medication? Keep out of the reach of children. Store at room temperature below 25 degrees C (77 degrees F). Protect fromlight. Throw away any unused medicine after the expiration date. NOTE: This sheet is a summary. It may not cover all possible information. If you have questions about this medicine, talk to your doctor, pharmacist, orhealth care provider.  2022 Elsevier/Gold Standard (2018-08-11 15:37:07)  

## 2020-11-14 LAB — COMPREHENSIVE METABOLIC PANEL
AG Ratio: 1.7 (calc) (ref 1.0–2.5)
ALT: 22 U/L (ref 9–46)
AST: 20 U/L (ref 10–35)
Albumin: 4.1 g/dL (ref 3.6–5.1)
Alkaline phosphatase (APISO): 76 U/L (ref 35–144)
BUN: 19 mg/dL (ref 7–25)
CO2: 30 mmol/L (ref 20–32)
Calcium: 9.5 mg/dL (ref 8.6–10.3)
Chloride: 107 mmol/L (ref 98–110)
Creat: 0.99 mg/dL (ref 0.70–1.18)
Globulin: 2.4 g/dL (calc) (ref 1.9–3.7)
Glucose, Bld: 75 mg/dL (ref 65–139)
Potassium: 4.7 mmol/L (ref 3.5–5.3)
Sodium: 142 mmol/L (ref 135–146)
Total Bilirubin: 0.5 mg/dL (ref 0.2–1.2)
Total Protein: 6.5 g/dL (ref 6.1–8.1)

## 2020-12-02 ENCOUNTER — Encounter: Payer: Self-pay | Admitting: Family Medicine

## 2020-12-02 ENCOUNTER — Other Ambulatory Visit: Payer: Self-pay | Admitting: *Deleted

## 2020-12-02 DIAGNOSIS — G6289 Other specified polyneuropathies: Secondary | ICD-10-CM

## 2020-12-02 DIAGNOSIS — F5104 Psychophysiologic insomnia: Secondary | ICD-10-CM

## 2020-12-02 DIAGNOSIS — E782 Mixed hyperlipidemia: Secondary | ICD-10-CM

## 2020-12-02 MED ORDER — GABAPENTIN 600 MG PO TABS: 600.0000 mg | ORAL_TABLET | Freq: Two times a day (BID) | ORAL | 3 refills | Status: DC

## 2020-12-03 DIAGNOSIS — H401222 Low-tension glaucoma, left eye, moderate stage: Secondary | ICD-10-CM | POA: Diagnosis not present

## 2020-12-08 MED ORDER — ATORVASTATIN CALCIUM 40 MG PO TABS: ORAL_TABLET | ORAL | 2 refills | Status: DC

## 2020-12-08 MED ORDER — GABAPENTIN 600 MG PO TABS: 600.0000 mg | ORAL_TABLET | Freq: Two times a day (BID) | ORAL | 3 refills | Status: DC

## 2020-12-08 MED ORDER — TRAZODONE HCL 50 MG PO TABS
ORAL_TABLET | ORAL | 1 refills | Status: DC
Start: 2020-12-08 — End: 2022-04-01

## 2020-12-08 MED ORDER — MELOXICAM 15 MG PO TABS
15.0000 mg | ORAL_TABLET | Freq: Every day | ORAL | 3 refills | Status: DC
Start: 2020-12-08 — End: 2021-04-28

## 2020-12-18 ENCOUNTER — Ambulatory Visit: Payer: Medicare Other | Admitting: Podiatry

## 2020-12-23 ENCOUNTER — Ambulatory Visit: Payer: Medicare Other | Admitting: Podiatry

## 2020-12-27 DIAGNOSIS — U071 COVID-19: Secondary | ICD-10-CM | POA: Diagnosis not present

## 2020-12-27 DIAGNOSIS — Z20822 Contact with and (suspected) exposure to covid-19: Secondary | ICD-10-CM | POA: Diagnosis not present

## 2020-12-28 ENCOUNTER — Encounter: Payer: Self-pay | Admitting: Family Medicine

## 2021-01-15 ENCOUNTER — Other Ambulatory Visit: Payer: Self-pay

## 2021-01-15 ENCOUNTER — Ambulatory Visit (INDEPENDENT_AMBULATORY_CARE_PROVIDER_SITE_OTHER): Payer: Medicare Other | Admitting: Podiatry

## 2021-01-15 DIAGNOSIS — M722 Plantar fascial fibromatosis: Secondary | ICD-10-CM

## 2021-01-15 DIAGNOSIS — L603 Nail dystrophy: Secondary | ICD-10-CM | POA: Diagnosis not present

## 2021-01-15 DIAGNOSIS — Z79899 Other long term (current) drug therapy: Secondary | ICD-10-CM

## 2021-01-15 LAB — HEPATIC FUNCTION PANEL
AG Ratio: 1.9 (calc) (ref 1.0–2.5)
ALT: 21 U/L (ref 9–46)
AST: 21 U/L (ref 10–35)
Albumin: 4.4 g/dL (ref 3.6–5.1)
Alkaline phosphatase (APISO): 76 U/L (ref 35–144)
Bilirubin, Direct: 0.1 mg/dL (ref 0.0–0.2)
Globulin: 2.3 g/dL (calc) (ref 1.9–3.7)
Indirect Bilirubin: 0.3 mg/dL (calc) (ref 0.2–1.2)
Total Bilirubin: 0.4 mg/dL (ref 0.2–1.2)
Total Protein: 6.7 g/dL (ref 6.1–8.1)

## 2021-01-15 MED ORDER — TERBINAFINE HCL 250 MG PO TABS
250.0000 mg | ORAL_TABLET | Freq: Every day | ORAL | 0 refills | Status: DC
Start: 2021-01-15 — End: 2021-06-09

## 2021-01-17 NOTE — Progress Notes (Signed)
He presents today for follow-up of his Planter fasciitis.  He has completed his first 30 days and denies fever chills nausea vomiting muscle aches pains calf pain back pain chest pain shortness of breath itching or rashes.  States that he does believe that he can see some difference at this point.  Also he states that he feels that his Planter fasciitis is starting to return.  Objective: Vital signs are stable he is alert and oriented x3 no change in his nails as of yet that I can see.  Skin does appear to be a bit clearer.  He does have tenderness on palpation medial calcaneal tubercle with firm plantar fascial calcaneal insertion site.  Assessment: Long-term therapy with Lamisil for onychomycosis.  Recurrence of Planter fasciitis right.  Plan: Discussed etiology pathology conservative surgical therapies we started him back on Lamisil tablets #91 a day.  I will follow-up with him in 4 months for this.  We also requested a liver profile and provided him with a requisition for this today.  Should this come back abnormal I will notify him immediately.  For the Planter fasciitis we had reinjected 20 mg of Kenalog for 5 mg of Marcaine to the point of maximal tenderness of his left heel.  He tolerated procedure well without complication after sterile Betadine skin prep.

## 2021-01-19 ENCOUNTER — Encounter: Payer: Self-pay | Admitting: Family Medicine

## 2021-01-20 NOTE — Addendum Note (Signed)
Addended by: Clovis Riley E on: 01/20/2021 12:31 PM   Modules accepted: Level of Service

## 2021-02-13 ENCOUNTER — Other Ambulatory Visit: Payer: Self-pay

## 2021-02-13 ENCOUNTER — Other Ambulatory Visit: Payer: Medicare Other

## 2021-02-13 ENCOUNTER — Telehealth: Payer: Self-pay

## 2021-02-13 DIAGNOSIS — Z13220 Encounter for screening for lipoid disorders: Secondary | ICD-10-CM

## 2021-02-13 DIAGNOSIS — E782 Mixed hyperlipidemia: Secondary | ICD-10-CM | POA: Diagnosis not present

## 2021-02-13 DIAGNOSIS — Z136 Encounter for screening for cardiovascular disorders: Secondary | ICD-10-CM | POA: Diagnosis not present

## 2021-02-13 NOTE — Telephone Encounter (Signed)
-----   Message from Garrel Ridgel, [place] sent at 01/17/2021  2:50 PM EDT ----- Blood work looks good and may continue current medications.

## 2021-02-13 NOTE — Telephone Encounter (Signed)
This encounter was created in error - please disregard.

## 2021-02-13 NOTE — Telephone Encounter (Signed)
Patient notified of results and informed to continue medication per Dr. Milinda Pointer

## 2021-02-14 LAB — COMPLETE METABOLIC PANEL WITH GFR
AG Ratio: 1.9 (calc) (ref 1.0–2.5)
ALT: 23 U/L (ref 9–46)
AST: 18 U/L (ref 10–35)
Albumin: 4.5 g/dL (ref 3.6–5.1)
Alkaline phosphatase (APISO): 92 U/L (ref 35–144)
BUN: 20 mg/dL (ref 7–25)
CO2: 29 mmol/L (ref 20–32)
Calcium: 10 mg/dL (ref 8.6–10.3)
Chloride: 102 mmol/L (ref 98–110)
Creat: 0.96 mg/dL (ref 0.70–1.28)
Globulin: 2.4 g/dL (calc) (ref 1.9–3.7)
Glucose, Bld: 87 mg/dL (ref 65–99)
Potassium: 4.7 mmol/L (ref 3.5–5.3)
Sodium: 140 mmol/L (ref 135–146)
Total Bilirubin: 0.5 mg/dL (ref 0.2–1.2)
Total Protein: 6.9 g/dL (ref 6.1–8.1)
eGFR: 85 mL/min/{1.73_m2} (ref >=60)

## 2021-02-14 LAB — LIPID PANEL
Cholesterol: 140 mg/dL (ref <200)
HDL: 40 mg/dL (ref >=40)
LDL Cholesterol (Calc): 79 mg/dL (calc)
Non-HDL Cholesterol (Calc): 100 mg/dL (calc) (ref <130)
Total CHOL/HDL Ratio: 3.5 (calc) (ref <5.0)
Triglycerides: 110 mg/dL (ref <150)

## 2021-02-14 LAB — CBC WITH DIFFERENTIAL/PLATELET
Absolute Monocytes: 715 cells/uL (ref 200–950)
Basophils Absolute: 20 cells/uL (ref 0–200)
Basophils Relative: 0.4 %
Eosinophils Absolute: 180 cells/uL (ref 15–500)
Eosinophils Relative: 3.6 %
HCT: 47.8 % (ref 38.5–50.0)
Hemoglobin: 16 g/dL (ref 13.2–17.1)
Lymphs Abs: 1500 cells/uL (ref 850–3900)
MCH: 30.7 pg (ref 27.0–33.0)
MCHC: 33.5 g/dL (ref 32.0–36.0)
MCV: 91.6 fL (ref 80.0–100.0)
MPV: 11.3 fL (ref 7.5–12.5)
Monocytes Relative: 14.3 %
Neutro Abs: 2585 cells/uL (ref 1500–7800)
Neutrophils Relative %: 51.7 %
Platelets: 195 10*3/uL (ref 140–400)
RBC: 5.22 10*6/uL (ref 4.20–5.80)
RDW: 12.9 % (ref 11.0–15.0)
Total Lymphocyte: 30 %
WBC: 5 10*3/uL (ref 3.8–10.8)

## 2021-02-19 ENCOUNTER — Ambulatory Visit (INDEPENDENT_AMBULATORY_CARE_PROVIDER_SITE_OTHER): Payer: Medicare Other | Admitting: Family Medicine

## 2021-02-19 ENCOUNTER — Encounter: Payer: Self-pay | Admitting: Family Medicine

## 2021-02-19 ENCOUNTER — Other Ambulatory Visit: Payer: Self-pay

## 2021-02-19 VITALS — BP 134/70 | HR 52 | Temp 97.9°F | Resp 16 | Ht 69.0 in | Wt 177.0 lb

## 2021-02-19 DIAGNOSIS — Z0001 Encounter for general adult medical examination with abnormal findings: Secondary | ICD-10-CM

## 2021-02-19 DIAGNOSIS — Z0000 Encounter for general adult medical examination without abnormal findings: Secondary | ICD-10-CM

## 2021-02-19 DIAGNOSIS — E049 Nontoxic goiter, unspecified: Secondary | ICD-10-CM

## 2021-02-19 DIAGNOSIS — C61 Malignant neoplasm of prostate: Secondary | ICD-10-CM | POA: Diagnosis not present

## 2021-02-19 DIAGNOSIS — Z8546 Personal history of malignant neoplasm of prostate: Secondary | ICD-10-CM | POA: Diagnosis not present

## 2021-02-19 LAB — PSA: PSA: <0.04 ng/mL (ref <=4.00)

## 2021-02-19 NOTE — Progress Notes (Signed)
Subjective:    Patient ID: Kyle Wilcox, male    DOB: 08-30-1950, 70 y.o.   MRN: [id number]  HPI Patient is a very pleasant 70 year old Caucasian male here today for his Medicare wellness visit.  He denies any depression, falls, or memory loss.  He does have a remote history of smoking.  He began smoking in the late 60s and quit in the early 80s.  He was screened for AAA in 2017 and this was normal.  He does not have sufficient smoking history to warrant a CT scan of the lungs for lung cancer.  His last colonoscopy was in 2018 and was normal without any polyps or irregularities.  He does have a history of prostate cancer and had his prostate removed.  He continues to suffer from neuropathy, vascular studies obtained last year showed no evidence of PVD.  Patient does report some constipation.  He is on trazodone and Benadryl on a daily basis to help him sleep.  He admits that he may not be drinking enough water.  He states that he goes to the bathroom every other day and has hard stool Immunization History  Administered Date(s) Administered  . Influenza, High Dose Seasonal PF 02/23/2016, 01/05/2017, 01/12/2019  . Influenza,inj,Quad PF,6+ Mos 03/10/2015, 02/08/2018  . Influenza-Unspecified 02/08/2018, 01/04/2020, 01/16/2021  . PFIZER(Purple Top)SARS-COV-2 Vaccination 09/12/2019, 10/02/2019, 04/04/2020, 08/16/2020  . Pneumococcal Conjugate-13 11/07/2014, 01/05/2017  . Pneumococcal Polysaccharide-23 03/17/2016  . Tdap 03/11/2015  . Zoster Recombinat (Shingrix) 05/29/2018, 09/26/2018   Lab on 02/13/2021  Component Date Value Ref Range Status  . WBC 02/13/2021 5.0  3.8 - 10.8 Thousand/uL Final  . RBC 02/13/2021 5.22  4.20 - 5.80 Million/uL Final  . Hemoglobin 02/13/2021 16.0  13.2 - 17.1 g/dL Final  . HCT 02/13/2021 47.8  38.5 - 50.0 % Final  . MCV 02/13/2021 91.6  80.0 - 100.0 fL Final  . MCH 02/13/2021 30.7  27.0 - 33.0 pg Final  . MCHC 02/13/2021 33.5  32.0 - 36.0 g/dL Final  . RDW  02/13/2021 12.9  11.0 - 15.0 % Final  . Platelets 02/13/2021 195  140 - 400 Thousand/uL Final  . MPV 02/13/2021 11.3  7.5 - 12.5 fL Final  . Neutro Abs 02/13/2021 2,585  1,500 - 7,800 cells/uL Final  . Lymphs Abs 02/13/2021 1,500  850 - 3,900 cells/uL Final  . Absolute Monocytes 02/13/2021 715  200 - 950 cells/uL Final  . Eosinophils Absolute 02/13/2021 180  15 - 500 cells/uL Final  . Basophils Absolute 02/13/2021 20  0 - 200 cells/uL Final  . Neutrophils Relative % 02/13/2021 51.7  % Final  . Total Lymphocyte 02/13/2021 30.0  % Final  . Monocytes Relative 02/13/2021 14.3  % Final  . Eosinophils Relative 02/13/2021 3.6  % Final  . Basophils Relative 02/13/2021 0.4  % Final  . Glucose, Bld 02/13/2021 87  65 - 99 mg/dL Final   Comment: .            Fasting reference interval .   . BUN 02/13/2021 20  7 - 25 mg/dL Final  . Creat 02/13/2021 0.96  0.70 - 1.28 mg/dL Final  . eGFR 02/13/2021 85  > OR = 60 mL/min/1.67m Final   Comment: The eGFR is based on the CKD-EPI 2021 equation. To calculate  the new eGFR from a previous Creatinine or Cystatin C result, go to https://www.kidney.org/professionals/ kdoqi/gfr%5Fcalculator   . BUN/Creatinine Ratio 062/37/6283NOT APPLICABLE  6 - 22 (calc) Final  . Sodium 02/13/2021 140  135 - 146 mmol/L Final  . Potassium 02/13/2021 4.7  3.5 - 5.3 mmol/L Final  . Chloride 02/13/2021 102  98 - 110 mmol/L Final  . CO2 02/13/2021 29  20 - 32 mmol/L Final  . Calcium 02/13/2021 10.0  8.6 - 10.3 mg/dL Final  . Total Protein 02/13/2021 6.9  6.1 - 8.1 g/dL Final  . Albumin 02/13/2021 4.5  3.6 - 5.1 g/dL Final  . Globulin 02/13/2021 2.4  1.9 - 3.7 g/dL (calc) Final  . AG Ratio 02/13/2021 1.9  1.0 - 2.5 (calc) Final  . Total Bilirubin 02/13/2021 0.5  0.2 - 1.2 mg/dL Final  . Alkaline phosphatase (APISO) 02/13/2021 92  35 - 144 U/L Final  . AST 02/13/2021 18  10 - 35 U/L Final  . ALT 02/13/2021 23  9 - 46 U/L Final  . Cholesterol 02/13/2021 140  <200 mg/dL Final   . HDL 02/13/2021 40  > OR = 40 mg/dL Final  . Triglycerides 02/13/2021 110  <150 mg/dL Final  . LDL Cholesterol (Calc) 02/13/2021 79  mg/dL (calc) Final   Comment: Reference range: <100 . Desirable range <100 mg/dL for primary prevention;   <70 mg/dL for patients with CHD or diabetic patients  with > or = 2 CHD risk factors. Marland Kitchen LDL-C is now calculated using the Martin-Hopkins  calculation, which is a validated novel method providing  better accuracy than the Friedewald equation in the  estimation of LDL-C.  Cresenciano Genre et al. Annamaria Helling. 2119;417(40): 2061-2068  (http://education.[employer].com/faq/FAQ164)   . Total CHOL/HDL Ratio 02/13/2021 3.5  <5.0 (calc) Final  . Non-HDL Cholesterol (Calc) 02/13/2021 100  <130 mg/dL (calc) Final   Comment: For patients with diabetes plus 1 major ASCVD risk  factor, treating to a non-HDL-C goal of <100 mg/dL  (LDL-C of <70 mg/dL) is considered a therapeutic  option.      Past Medical History:  Diagnosis Date  . Allergy 2014  . Arthritis    Phreesia 02/16/2020  . Cancer [facility]) 2015   prostate cancer  . Cataract 2016  . COVID-19 05/2019  . COVID-19 05/2019  . DDD (degenerative disc disease), lumbar 12/24/2014  . GERD (gastroesophageal reflux disease)   . Glaucoma   . Hx of colonic polyps 02/19/2011  . Hyperlipidemia 11/07/2014  . Peripheral neuropathy 12/24/2014   Past Surgical History:  Procedure Laterality Date  . COLONOSCOPY    . EYE SURGERY     laser surgery to relieve pressure on eyes- both eyes  . JOINT REPLACEMENT  2011   total shoulder replacement (Right)  . LAPAROTOMY N/A 01/06/2018   Procedure: EXPLORATORY LAPAROTOMY;  Surgeon: Judeth Horn, MD;  Location: [facility];  Service: General;  Laterality: N/A;  . LYSIS OF ADHESION N/A 01/06/2018   Procedure: LYSIS OF ADHESION;  Surgeon: Judeth Horn, MD;  Location: [facility];  Service: General;  Laterality: N/A;  . PROSTATE SURGERY     2015  . SMALL INTESTINE SURGERY N/A    Phreesia  02/16/2020  . TONSILLECTOMY    . TOTAL SHOULDER ARTHROPLASTY Left 03/04/2020   Procedure: TOTAL SHOULDER ARTHROPLASTY;  Surgeon: Marchia Bond, MD;  Location: [facility];  Service: Orthopedics;  Laterality: Left;  Marland Kitchen VASECTOMY  1982   Current Outpatient Medications on File Prior to Visit  Medication Sig Dispense Refill  . aspirin 81 MG EC tablet Take 81 mg by mouth daily. Swallow whole.    Marland Kitchen atorvastatin (LIPITOR) 40 MG tablet TAKE 1 TABLET DAILY AT 6PM 90 tablet 2  .  diphenhydrAMINE (BENADRYL) 25 MG tablet Take 25 mg by mouth See admin instructions. Take 25 mg daily, may take a second 25 mg dose as needed for allergies    . dorzolamide-timolol (COSOPT) 22.3-6.8 MG/ML ophthalmic solution Place 1 drop into both eyes 2 (two) times daily.     Marland Kitchen FIBER PO Take 1 capsule by mouth 2 (two) times daily.    Marland Kitchen gabapentin (NEURONTIN) 600 MG tablet Take 1 tablet (600 mg total) by mouth 2 (two) times daily. 180 tablet 3  . Latanoprostene Bunod 0.024 % SOLN Place 1 drop into both eyes at bedtime.     . meloxicam (MOBIC) 15 MG tablet Take 1 tablet (15 mg total) by mouth daily. 30 tablet 3  . Omega-3 Fatty Acids (OMEGA-3 FISH OIL) 300 MG CAPS Take 300 mg by mouth daily.    . ONE DAILY MULTIPLE VITAMIN PO Take 1 tablet by mouth daily.     Marland Kitchen terbinafine (LAMISIL) 250 MG tablet Take 1 tablet (250 mg total) by mouth daily. 90 tablet 0  . traZODone (DESYREL) 50 MG tablet TAKE 1/2 TO 1 (ONE-HALF TO ONE) TABLET BY MOUTH AT BEDTIME AS NEEDED FOR SLEEP 90 tablet 1  . vitamin C (ASCORBIC ACID) 500 MG tablet Take 500 mg by mouth daily.     No current facility-administered medications on file prior to visit.   No Known Allergies Social History   Socioeconomic History  . Marital status: Married    Spouse name: Not on file  . Number of children: Not on file  . Years of education: Not on file  . Highest education level: Not on file  Occupational History  . Not on file  Tobacco Use  . Smoking status: Former     Types: Cigarettes    Quit date: 03/09/1981    Years since quitting: 39.9  . Smokeless tobacco: Never  Vaping Use  . Vaping Use: Never used  Substance and Sexual Activity  . Alcohol use: Yes    Alcohol/week: 2.0 standard drinks    Types: 2 Cans of beer per week    Comment: occasionally  . Drug use: No  . Sexual activity: Not Currently  Other Topics Concern  . Not on file  Social History Narrative  . Not on file   Social Determinants of Health   Financial Resource Strain: Not on file  Food Insecurity: Not on file  Transportation Needs: Not on file  Physical Activity: Not on file  Stress: Not on file  Social Connections: Not on file  Intimate Partner Violence: Not on file   Family History  Problem Relation Age of Onset  . Arthritis Mother   . Heart disease Mother   . Hyperlipidemia Mother   . Hypertension Mother   . Stroke Mother   . Vision loss Mother   . Arthritis Father   . Depression Father   . Heart disease Father   . Hyperlipidemia Father   . Stroke Father   . Arthritis Sister   . Cancer Sister   . Diabetes Sister   . Hyperlipidemia Sister   . Arthritis Brother   . Diabetes Brother   . Heart disease Brother   . Hyperlipidemia Brother   . Hypertension Brother   . Stomach cancer Maternal Grandfather   . Colon cancer Neg Hx   . Esophageal cancer Neg Hx   . Rectal cancer Neg Hx      Review of Systems  All other systems reviewed and are negative.  Objective:   Physical Exam Vitals reviewed.  Constitutional:      General: He is not in acute distress.    Appearance: Normal appearance. He is not ill-appearing, toxic-appearing or diaphoretic.  HENT:     Head: Normocephalic and atraumatic.     Right Ear: Tympanic membrane, ear canal and external ear normal.     Left Ear: Tympanic membrane, ear canal and external ear normal.     Nose: Nose normal. No congestion or rhinorrhea.     Mouth/Throat:     Mouth: Mucous membranes are moist.     Pharynx:  Oropharynx is clear. No oropharyngeal exudate or posterior oropharyngeal erythema.  Eyes:     General: No scleral icterus.       Right eye: No discharge.        Left eye: No discharge.     Extraocular Movements: Extraocular movements intact.     Conjunctiva/sclera: Conjunctivae normal.     Pupils: Pupils are equal, round, and reactive to light.  Neck:     Thyroid: Thyromegaly present.     Vascular: No carotid bruit.  Cardiovascular:     Rate and Rhythm: Normal rate and regular rhythm.     Pulses:          Dorsalis pedis pulses are 0 on the left side.     Heart sounds: Normal heart sounds. No murmur heard.   No friction rub. No gallop.  Pulmonary:     Effort: Pulmonary effort is normal. No respiratory distress.     Breath sounds: Normal breath sounds. No stridor. No wheezing, rhonchi or rales.  Chest:     Chest wall: No tenderness.  Abdominal:     General: Abdomen is flat. Bowel sounds are normal. There is no distension.     Palpations: Abdomen is soft. There is no mass.     Tenderness: There is no abdominal tenderness. There is no right CVA tenderness, left CVA tenderness, guarding or rebound.     Hernia: No hernia is present.  Musculoskeletal:     Cervical back: Normal range of motion and neck supple.     Right lower leg: No edema.     Left lower leg: No edema.  Lymphadenopathy:     Cervical: No cervical adenopathy.  Skin:    General: Skin is warm.     Coloration: Skin is not jaundiced or pale.     Findings: No bruising, erythema, lesion or rash.  Neurological:     General: No focal deficit present.     Mental Status: He is alert and oriented to person, place, and time. Mental status is at baseline.     Cranial Nerves: No cranial nerve deficit.     Sensory: No sensory deficit.     Motor: No weakness.     Coordination: Coordination normal.     Gait: Gait normal.     Deep Tendon Reflexes: Reflexes normal.  Psychiatric:        Mood and Affect: Mood normal.         Behavior: Behavior normal.        Thought Content: Thought content normal.        Judgment: Judgment normal.          Assessment & Plan:  Prostate cancer ([place]) - Plan: PSA  Goiter - Plan: US THYROID  General medical exam  History of prostate cancer Feel that the patient may have an enlarged thyroid gland on his exam today.  Therefore, get an  ultrasound of the neck to evaluate further.  Check a PSA to screen for prostate cancer recurrence.  Colonoscopy is up-to-date.  Immunizations are up-to-date.  The remainder of his lab work is outstanding.  Regular anticipatory guidance is provided

## 2021-03-02 ENCOUNTER — Ambulatory Visit
Admission: RE | Admit: 2021-03-02 | Discharge: 2021-03-02 | Disposition: A | Discharge status: Home / Self Care | Payer: Medicare Other | Source: Ambulatory Visit | Attending: Family Medicine | Admitting: Family Medicine

## 2021-03-02 DIAGNOSIS — E010 Iodine-deficiency related diffuse (endemic) goiter: Secondary | ICD-10-CM | POA: Diagnosis not present

## 2021-03-02 DIAGNOSIS — E049 Nontoxic goiter, unspecified: Secondary | ICD-10-CM

## 2021-03-04 DIAGNOSIS — M19012 Primary osteoarthritis, left shoulder: Secondary | ICD-10-CM | POA: Diagnosis not present

## 2021-04-27 ENCOUNTER — Encounter: Payer: Self-pay | Admitting: Family Medicine

## 2021-04-28 MED ORDER — MELOXICAM 15 MG PO TABS
15.0000 mg | ORAL_TABLET | Freq: Every day | ORAL | 3 refills | Status: DC
Start: 2021-04-28 — End: 2021-10-23

## 2021-05-07 ENCOUNTER — Ambulatory Visit (INDEPENDENT_AMBULATORY_CARE_PROVIDER_SITE_OTHER): Payer: Medicare Other | Admitting: Podiatry

## 2021-05-07 ENCOUNTER — Other Ambulatory Visit: Payer: Self-pay

## 2021-05-07 DIAGNOSIS — Z79899 Other long term (current) drug therapy: Secondary | ICD-10-CM

## 2021-05-07 DIAGNOSIS — B351 Tinea unguium: Secondary | ICD-10-CM | POA: Diagnosis not present

## 2021-05-07 DIAGNOSIS — M722 Plantar fascial fibromatosis: Secondary | ICD-10-CM

## 2021-05-07 MED ORDER — TERBINAFINE HCL 250 MG PO TABS: 250.0000 mg | ORAL_TABLET | Freq: Every day | ORAL | 0 refills | Status: DC

## 2021-05-07 NOTE — Progress Notes (Signed)
Presents today chief complaint of painful right heel.  States that the plantar fascia is acting up.  States that his toenails are looking much better and he would consider taking another round of the medication.  Objective: Vital signs are stable alert and oriented x3.  Pulses are palpable.  There is no erythema edema cellulitis drainage or odor only exquisite pain on palpation medial calcaneal tubercle of the right heel.  No pain on medial lateral compression of the calcaneus.  Some tenderness on palpation of the Achilles is noted.  Toenails appear to be growing out by greater than 50%.  Assessment: Well-healing onychomycosis due to long-term therapy.  And Planter fasciitis recurrence right foot.  Plan: We injected the right heel today 20 mg Kenalog 5 mg Marcaine point maximal tenderness.  Also refilled at a 30-day Lamisil prescription.  He will take 1 pill a day for the next 30 days and I will follow-up with him in about a month or so for reevaluation of the plantar fasciitis and we will also look at the toenails.

## 2021-05-08 DIAGNOSIS — M722 Plantar fascial fibromatosis: Secondary | ICD-10-CM | POA: Diagnosis not present

## 2021-05-08 DIAGNOSIS — B351 Tinea unguium: Secondary | ICD-10-CM | POA: Diagnosis not present

## 2021-05-08 MED ORDER — TRIAMCINOLONE ACETONIDE 40 MG/ML IJ SUSP
20.0000 mg | Freq: Once | INTRAMUSCULAR | Status: AC
  Administered 2021-05-08: 07:00:00 20 mg

## 2021-05-08 NOTE — Addendum Note (Signed)
Addended by: Clovis Riley E on: 05/08/2021 07:06 AM   Modules accepted: Orders, Level of Service

## 2021-06-09 ENCOUNTER — Encounter: Payer: Self-pay | Admitting: Podiatry

## 2021-06-09 ENCOUNTER — Other Ambulatory Visit: Payer: Self-pay | Admitting: Family Medicine

## 2021-06-09 ENCOUNTER — Ambulatory Visit (INDEPENDENT_AMBULATORY_CARE_PROVIDER_SITE_OTHER): Payer: Medicare Other | Admitting: Podiatry

## 2021-06-09 ENCOUNTER — Other Ambulatory Visit: Payer: Self-pay

## 2021-06-09 DIAGNOSIS — M722 Plantar fascial fibromatosis: Secondary | ICD-10-CM | POA: Diagnosis not present

## 2021-06-09 DIAGNOSIS — L603 Nail dystrophy: Secondary | ICD-10-CM

## 2021-06-09 DIAGNOSIS — Z79899 Other long term (current) drug therapy: Secondary | ICD-10-CM

## 2021-06-09 DIAGNOSIS — E782 Mixed hyperlipidemia: Secondary | ICD-10-CM

## 2021-06-09 MED ORDER — TRIAMCINOLONE ACETONIDE 40 MG/ML IJ SUSP
20.0000 mg | Freq: Once | INTRAMUSCULAR | Status: AC
  Administered 2021-06-09: 15:00:00 20 mg

## 2021-06-09 MED ORDER — TERBINAFINE HCL 250 MG PO TABS: 250.0000 mg | ORAL_TABLET | Freq: Every day | ORAL | 0 refills | Status: DC

## 2021-06-10 DIAGNOSIS — H401222 Low-tension glaucoma, left eye, moderate stage: Secondary | ICD-10-CM | POA: Diagnosis not present

## 2021-06-10 DIAGNOSIS — H401213 Low-tension glaucoma, right eye, severe stage: Secondary | ICD-10-CM | POA: Diagnosis not present

## 2021-06-10 NOTE — Progress Notes (Signed)
He presents today after having completed his first 30 days of Lamisil denies any fever chills nausea vomiting muscle aches pains associated with the medication no rashes or itching either.  He states that the first injection are to work fine he said the last 1 did not last more than 2 weeks.  Objective: Vital signs are stable alert oriented x3.  No change in the toenails as of yet.  That he does have pain on palpation medial calcaneal tubercle of the right heel.  Assessment: Long-term therapy with Lamisil for onychomycosis.  Also recurrent Planter fasciitis.  Plan: Discussed etiology pathology conservative surgical therapies.  Should his I reinjected his heel today with Kenalog and local anesthetic.  Should this recur once again quickly as it did before MRI will be necessary to rule out a tear.  I am also starting him on his next 90 tablets of Lamisil but we are requesting a CMP.  Should this be abnormal we will notify him immediately.

## 2021-06-11 DIAGNOSIS — Z79899 Other long term (current) drug therapy: Secondary | ICD-10-CM | POA: Diagnosis not present

## 2021-06-11 LAB — COMPREHENSIVE METABOLIC PANEL
AG Ratio: 1.8 (calc) (ref 1.0–2.5)
ALT: 26 U/L (ref 9–46)
AST: 20 U/L (ref 10–35)
Albumin: 4.5 g/dL (ref 3.6–5.1)
Alkaline phosphatase (APISO): 84 U/L (ref 35–144)
BUN: 16 mg/dL (ref 7–25)
CO2: 35 mmol/L — ABNORMAL HIGH (ref 20–32)
Calcium: 10.4 mg/dL — ABNORMAL HIGH (ref 8.6–10.3)
Chloride: 105 mmol/L (ref 98–110)
Creat: 0.92 mg/dL (ref 0.70–1.28)
Globulin: 2.5 g/dL (calc) (ref 1.9–3.7)
Glucose, Bld: 92 mg/dL (ref 65–99)
Potassium: 4.6 mmol/L (ref 3.5–5.3)
Sodium: 146 mmol/L (ref 135–146)
Total Bilirubin: 0.5 mg/dL (ref 0.2–1.2)
Total Protein: 7 g/dL (ref 6.1–8.1)

## 2021-06-19 ENCOUNTER — Encounter: Payer: Self-pay | Admitting: Podiatry

## 2021-06-22 ENCOUNTER — Ambulatory Visit (INDEPENDENT_AMBULATORY_CARE_PROVIDER_SITE_OTHER): Payer: Medicare Other | Admitting: Family Medicine

## 2021-06-22 ENCOUNTER — Encounter: Payer: Self-pay | Admitting: Family Medicine

## 2021-06-22 ENCOUNTER — Other Ambulatory Visit: Payer: Self-pay

## 2021-06-22 VITALS — BP 142/82 | HR 54 | Temp 97.6°F | Resp 18 | Ht 69.0 in | Wt 177.0 lb

## 2021-06-22 DIAGNOSIS — L600 Ingrowing nail: Secondary | ICD-10-CM

## 2021-06-22 NOTE — Progress Notes (Signed)
Subjective:    Patient ID: Kyle Wilcox, male    DOB: 23-Apr-1951, 71 y.o.   MRN: [id number]  Toe Pain    Patient reports a 3 to 4-week history of pain in his left third toe.  It hurts to walk.  The lateral nail margin is ingrown.  There is no erythema warmth or fluctuance.  There is no evidence of any infection.  He has tried to remove the ingrown portion of the nail but he is unsuccessful.  It is tender to palpation over the distal tip of the toe where the toenail grows inward. Past Medical History:  Diagnosis Date   Allergy 2014   Arthritis    Phreesia 02/16/2020   Cancer ([place]) 2015   prostate cancer   Cataract 2016   COVID-19 05/2019   COVID-19 05/2019   DDD (degenerative disc disease), lumbar 12/24/2014   GERD (gastroesophageal reflux disease)    Glaucoma    Hx of colonic polyps 02/19/2011   Hyperlipidemia 11/07/2014   Peripheral neuropathy 12/24/2014   Past Surgical History:  Procedure Laterality Date   COLONOSCOPY     EYE SURGERY     laser surgery to relieve pressure on eyes- both eyes   JOINT REPLACEMENT  2011   total shoulder replacement (Right)   LAPAROTOMY N/A 01/06/2018   Procedure: EXPLORATORY LAPAROTOMY;  Surgeon: Judeth Horn, MD;  Location: [facility];  Service: General;  Laterality: N/A;   LYSIS OF ADHESION N/A 01/06/2018   Procedure: LYSIS OF ADHESION;  Surgeon: Judeth Horn, MD;  Location: [facility];  Service: General;  Laterality: N/A;   PROSTATE SURGERY     2015   SMALL INTESTINE SURGERY N/A    Phreesia 02/16/2020   TONSILLECTOMY     TOTAL SHOULDER ARTHROPLASTY Left 03/04/2020   Procedure: TOTAL SHOULDER ARTHROPLASTY;  Surgeon: Marchia Bond, MD;  Location: [facility];  Service: Orthopedics;  Laterality: Left;   VASECTOMY  1982   Current Outpatient Medications on File Prior to Visit  Medication Sig Dispense Refill   aspirin 81 MG EC tablet Take 81 mg by mouth daily. Swallow whole.     atorvastatin (LIPITOR) 40 MG tablet TAKE 1 TABLET DAILY AT 6PM 90 tablet 2    diphenhydrAMINE (BENADRYL) 25 MG tablet Take 25 mg by mouth See admin instructions. Take 25 mg daily, may take a second 25 mg dose as needed for allergies     dorzolamide-timolol (COSOPT) 22.3-6.8 MG/ML ophthalmic solution Place 1 drop into both eyes 2 (two) times daily.      FIBER PO Take 1 capsule by mouth 2 (two) times daily.     gabapentin (NEURONTIN) 600 MG tablet Take 1 tablet (600 mg total) by mouth 2 (two) times daily. 180 tablet 3   Latanoprostene Bunod 0.024 % SOLN Place 1 drop into both eyes at bedtime.      meloxicam (MOBIC) 15 MG tablet Take 1 tablet (15 mg total) by mouth daily. 30 tablet 3   Omega-3 Fatty Acids (OMEGA-3 FISH OIL) 300 MG CAPS Take 300 mg by mouth daily.     ONE DAILY MULTIPLE VITAMIN PO Take 1 tablet by mouth daily.      terbinafine (LAMISIL) 250 MG tablet Take 1 tablet (250 mg total) by mouth daily. 90 tablet 0   traZODone (DESYREL) 50 MG tablet TAKE 1/2 TO 1 (ONE-HALF TO ONE) TABLET BY MOUTH AT BEDTIME AS NEEDED FOR SLEEP 90 tablet 1   vitamin C (ASCORBIC ACID) 500 MG tablet Take 500 mg  by mouth daily.     No current facility-administered medications on file prior to visit.   Marland Kitchenall Social History   Socioeconomic History   Marital status: Married    Spouse name: Not on file   Number of children: Not on file   Years of education: Not on file   Highest education level: Not on file  Occupational History   Not on file  Tobacco Use   Smoking status: Former    Types: Cigarettes    Quit date: 03/09/1981    Years since quitting: 40.3   Smokeless tobacco: Never  Vaping Use   Vaping Use: Never used  Substance and Sexual Activity   Alcohol use: Yes    Alcohol/week: 2.0 standard drinks    Types: 2 Cans of beer per week    Comment: occasionally   Drug use: No   Sexual activity: Not Currently  Other Topics Concern   Not on file  Social History Narrative   Not on file   Social Determinants of Health   Financial Resource Strain: Not on file  Food  Insecurity: Not on file  Transportation Needs: Not on file  Physical Activity: Not on file  Stress: Not on file  Social Connections: Not on file  Intimate Partner Violence: Not on file     Review of Systems  All other systems reviewed and are negative.     Objective:   Physical Exam Vitals reviewed.  Constitutional:      Appearance: Normal appearance.  Cardiovascular:     Rate and Rhythm: Normal rate and regular rhythm.     Heart sounds: Normal heart sounds.  Pulmonary:     Breath sounds: Normal breath sounds.  Musculoskeletal:       Feet:  Neurological:     Mental Status: He is alert.          Assessment & Plan:   Ingrown toenail of left foot We discussed treatment options and the patient elects to remove the ingrown portion of the nail.  I achieved anesthesia using a digital block with 0.1% lidocaine without epinephrine.  A tourniquet was applied to the base of the toe.  A metal elevator was used to separate the ingrown portion of the nail from the underlying nailbed.  The ingrown portion of the nail was separated from the remaining nail using a pair of rongeurs.  The nail was then removed with gentle traction.  The toe was then wrapped with petroleum gauze and Coban.  Tourniquet was removed.  Total tourniquet time was less than 3 minutes.

## 2021-06-22 NOTE — Telephone Encounter (Signed)
Please advise 

## 2021-06-23 NOTE — Telephone Encounter (Signed)
Spoke with patient on the phone.  Questions answered.  He also spoke with his PCP.

## 2021-07-12 IMAGING — DX DG SHOULDER 1V*L*
1 series · 1 of 1 positions shown · non-contrast
Comparison: None

CLINICAL DATA: Left total shoulder arthroplasty

EXAM:
LEFT SHOULDER

[shoulder ap]
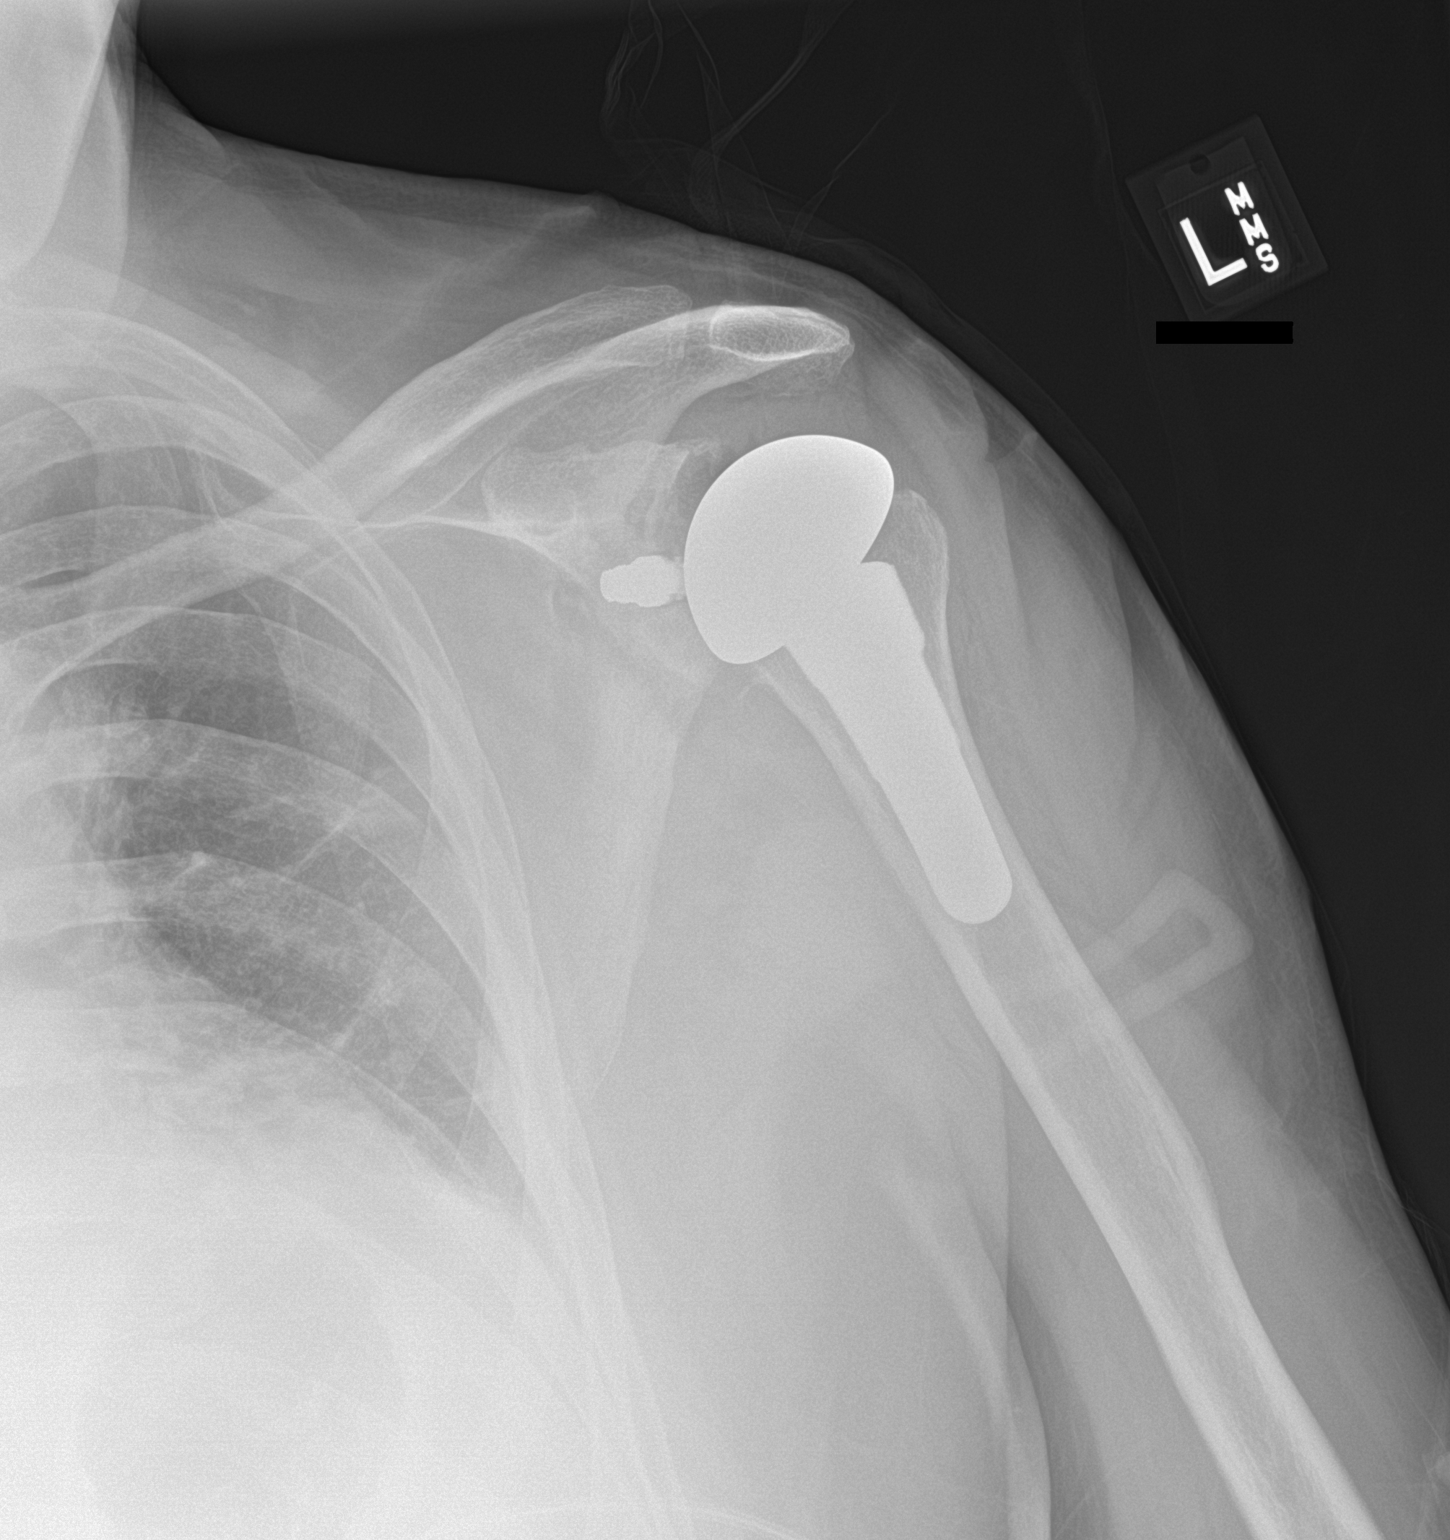

[1 of 1 positions shown; findings below may reference images not displayed]

FINDINGS: Single view radiograph left shoulder demonstrates surgical changes
of left reversed total shoulder arthroplasty with arthroplasty
components overlying the expected position. Oblique lucency within
the a superior aspect of the residual glenoid may represent
postsurgical change or a minimally displaced fracture of the
superior rim of the residual glenoid. Osseous structures are
otherwise unremarkable. Limited evaluation of the left hemithorax is
unremarkable.
IMPRESSION: Status post left total shoulder arthroplasty. Possible minimally
displaced fracture of the superior aspect of the residual glenoid.

## 2021-08-11 ENCOUNTER — Other Ambulatory Visit: Payer: Self-pay

## 2021-08-11 ENCOUNTER — Ambulatory Visit (INDEPENDENT_AMBULATORY_CARE_PROVIDER_SITE_OTHER): Payer: Medicare Other | Admitting: Podiatry

## 2021-08-11 ENCOUNTER — Encounter: Payer: Self-pay | Admitting: Podiatry

## 2021-08-11 DIAGNOSIS — M722 Plantar fascial fibromatosis: Secondary | ICD-10-CM

## 2021-08-11 MED ORDER — TRIAMCINOLONE ACETONIDE 40 MG/ML IJ SUSP
20.0000 mg | Freq: Once | INTRAMUSCULAR | Status: AC
  Administered 2021-08-11: 20 mg

## 2021-08-12 NOTE — Progress Notes (Signed)
He presents today for follow-up of pain to his right heel.  He states that it is doing better than it was initially but has been bothering me recently. ? ?Objective: Vital signs are stable he is alert and oriented x3.  Pulses are palpable.  His pain on palpation medial calcaneal tubercle. ? ?Assessment: Planter fasciitis medial calcaneal tubercle right foot. ? ?Plan: Injected the area today with his fourth dose of Kenalog and local anesthetic.  10 mg was injected.  May need to consider MRI next visit. ?

## 2021-08-17 ENCOUNTER — Telehealth: Payer: Self-pay | Admitting: *Deleted

## 2021-08-17 DIAGNOSIS — S93691A Other sprain of right foot, initial encounter: Secondary | ICD-10-CM

## 2021-08-17 NOTE — Telephone Encounter (Signed)
Patient is calling because he wants information on possibly getting an MRI, the injection given last week is not helping, still feeling some pain in the heel. Please advise. ?

## 2021-08-18 NOTE — Telephone Encounter (Signed)
MRI orders placed and patient notified ?

## 2021-08-26 ENCOUNTER — Ambulatory Visit
Admission: RE | Admit: 2021-08-26 | Discharge: 2021-08-26 | Disposition: A | Discharge status: Home / Self Care | Payer: Medicare Other | Source: Ambulatory Visit | Attending: Podiatry | Admitting: Podiatry

## 2021-08-26 ENCOUNTER — Other Ambulatory Visit: Payer: Medicare Other

## 2021-08-26 DIAGNOSIS — M722 Plantar fascial fibromatosis: Secondary | ICD-10-CM | POA: Diagnosis not present

## 2021-08-26 DIAGNOSIS — S93691A Other sprain of right foot, initial encounter: Secondary | ICD-10-CM

## 2021-08-26 DIAGNOSIS — S93491A Sprain of other ligament of right ankle, initial encounter: Secondary | ICD-10-CM | POA: Diagnosis not present

## 2021-08-27 ENCOUNTER — Encounter: Payer: Self-pay | Admitting: Podiatry

## 2021-08-27 ENCOUNTER — Telehealth: Payer: Self-pay | Admitting: *Deleted

## 2021-08-27 NOTE — Telephone Encounter (Signed)
-----   Message from Garrel Ridgel, [place] sent at 08/27/2021  7:26 AM EDT ----- ?You can let him know that he has plantar fasciitis as suspected with no tear. Also some ankle arthritis.  With no tear and the chronicity tx options will be orthotics and PT or Orthotics and surgical release of the fascia.  Thanks. ?

## 2021-08-28 ENCOUNTER — Encounter: Payer: Self-pay | Admitting: Family Medicine

## 2021-08-29 ENCOUNTER — Other Ambulatory Visit: Payer: Medicare Other

## 2021-08-31 ENCOUNTER — Other Ambulatory Visit: Payer: Self-pay | Admitting: Family Medicine

## 2021-08-31 DIAGNOSIS — M722 Plantar fascial fibromatosis: Secondary | ICD-10-CM

## 2021-09-01 NOTE — Therapy (Signed)
?OUTPATIENT PHYSICAL THERAPY LOWER EXTREMITY EVALUATION ? ? ?Patient Name: Kyle Wilcox ?MRN: [id number] ?DOB:1950-12-12, 71 y.o., male ?Today's Date: 09/02/2021 ? ? PT End of Session - 09/02/21 1004   ? ? Visit Number 1   ? Number of Visits 8   ? Date for PT Re-Evaluation 10/28/21   ? Authorization Type MCR   ? Progress Note Due on Visit 10   ? PT Start Time 5885   ? PT Stop Time 1000   ? PT Time Calculation (min) 45 min   ? Activity Tolerance Patient tolerated treatment well   ? Behavior During Therapy [facility] for tasks assessed/performed   ? ?  ?  ? ?  ? ? ?Past Medical History:  ?Diagnosis Date  ? Allergy 2014  ? Arthritis   ? [employer] 02/16/2020  ? Cancer [facility]) 2015  ? prostate cancer  ? Cataract 2016  ? COVID-19 05/2019  ? COVID-19 05/2019  ? DDD (degenerative disc disease), lumbar 12/24/2014  ? GERD (gastroesophageal reflux disease)   ? Glaucoma   ? Hx of colonic polyps 02/19/2011  ? Hyperlipidemia 11/07/2014  ? Peripheral neuropathy 12/24/2014  ? ?Past Surgical History:  ?Procedure Laterality Date  ? COLONOSCOPY    ? EYE SURGERY    ? laser surgery to relieve pressure on eyes- both eyes  ? JOINT REPLACEMENT  2011  ? total shoulder replacement (Right)  ? LAPAROTOMY N/A 01/06/2018  ? Procedure: EXPLORATORY LAPAROTOMY;  Surgeon: Judeth Horn, MD;  Location: [place];  Service: General;  Laterality: N/A;  ? LYSIS OF ADHESION N/A 01/06/2018  ? Procedure: LYSIS OF ADHESION;  Surgeon: Judeth Horn, MD;  Location: [place];  Service: General;  Laterality: N/A;  ? PROSTATE SURGERY    ? 2015  ? SMALL INTESTINE SURGERY N/A   ? [employer] 02/16/2020  ? TONSILLECTOMY    ? TOTAL SHOULDER ARTHROPLASTY Left 03/04/2020  ? Procedure: TOTAL SHOULDER ARTHROPLASTY;  Surgeon: Marchia Bond, MD;  Location: [facility];  Service: Orthopedics;  Laterality: Left;  ? VASECTOMY  1982  ? ?Patient Active Problem List  ? Diagnosis Date Noted  ? S/P shoulder replacement, left 03/04/2020  ? Overweight (BMI 25.0-29.9) 03/28/2018  ? Abdominal distention 01/04/2018  ?  Plantar fasciitis of right foot 11/17/2016  ? Chronic insomnia 10/18/2016  ? Male impotence 12/24/2014  ? DDD (degenerative disc disease), lumbar 12/24/2014  ? Peripheral neuropathy 12/24/2014  ? Glaucoma 11/07/2014  ? Hyperlipidemia, unspecified 11/07/2014  ? History of prostate cancer 11/07/2014  ? Hx of colonic polyps 02/19/2011  ? ? ?PCP: Susy Frizzle, MD ? ?REFERRING PROVIDER: Susy Frizzle, MD ? ?REFERRING DIAG: Plantar fasciitis ? ?THERAPY DIAG:  ?Pain in right foot ? ?Stiffness of right ankle, not elsewhere classified ? ?Muscle weakness (generalized) ? ?ONSET DATE: chronic pain ? ?SUBJECTIVE:  ?SUBJECTIVE STATEMENT: ?Patient reports right plantar fasciitis. He has had several injections but the last one did not help. Has been on and off for many years. He is planning to get fit for custom orthotics. ? ?PERTINENT HISTORY: ?None ? ?PAIN:  ?Are you having pain? Yes:  ?NPRS scale: 1/10 (can worsen to 9/10 with walking) ?Pain location: Right foot, plantar aspect ?Pain description: Hervey Ard ?Aggravating factors: Walking ?Relieving factors: Heel pad or shoe inserts, rolling foot on frozen bottle, heat ? ?PRECAUTIONS: None ? ?WEIGHT BEARING RESTRICTIONS No ? ?FALLS:  ?Has patient fallen in last 6 months? No ? ?LIVING ENVIRONMENT: ?Lives with: lives with their family ? ?OCCUPATION: Retired ? ?PLOF: Independent ? ?  PATIENT GOALS: Pain relief and improve walking ? ? ?OBJECTIVE:  ?DIAGNOSTIC FINDINGS:  ? MRI Right Ankle 08/26/2021: ?IMPRESSION: ?1. Mild medial band of the plantar fasciitis. ?2. High-grade partial-thickness anterior talofibular ligament tear, likely chronic. ?3. Partial-thickness cartilage loss within the far anterolateral aspect of the talar dome. ?4. Incidental note of normal variant accessory peroneus quartus muscle and tendon. ? ?PATIENT SURVEYS:  ?FOTO 53% functional status ? ?COGNITION: ?Overall cognitive status: Within functional limits for tasks assessed   ?  ?SENSATION: ?WFL ? ?MUSCLE  LENGTH: ?Calf flexibility deficit ? ?POSTURE:  ?Patient exhibits bilateral bunions and pes planus ? ?PALPATION: ?Tender to right calcaneal tubercle region ? ?LE ROM: ? ?Active ROM Right ?09/02/2021 Left ?09/02/2021  ?Ankle dorsiflexion 3 5  ?Ankle plantarflexion 55 55  ?Ankle inversion 35 35  ?Ankle eversion 15 15  ? ?LE MMT: ? ?MMT Right ?09/02/2021 Left ?09/02/2021  ?Hip flexion 4 4  ?Hip extension 4 4  ?Hip abduction 4 4  ?Ankle dorsiflexion 5 5  ?Ankle plantarflexion 4 4-  ?Ankle inversion 4+ 4+  ?Ankle eversion 5 5  ? ?FUNCTIONAL TESTS:  ?SLS < 5 sec bilaterally ? ?GAIT: ?Assistive device utilized: None ?Level of assistance: Complete Independence ?Comments: Slightly antalgic on right, bilateral toe out with increased pronation ? ? ?TODAY'S TREATMENT: ?Standing calf stretch 2 x 30 sec each ?Standing heel raises 2 x 10 ?Seated figure-4 ankle inversion with green x 10 ?Longsitting ankle PF with black x 10 ? ?PATIENT EDUCATION:  ?Education details: Exam findings, POC, HEP, use of TPDN for future treatment, shoe inserts ?Person educated: Patient ?Education method: Explanation, Demonstration, Tactile cues, Verbal cues, and Handouts ?Education comprehension: verbalized understanding, returned demonstration, verbal cues required, tactile cues required, and needs further education ? ?HOME EXERCISE PROGRAM: ?Access Code: [email or web address] ? ? ?ASSESSMENT: ?CLINICAL IMPRESSION: ?Patient is a 71 y.o. male who was seen today for physical therapy evaluation and treatment for right plantar foot pain consistent with plantar fasciitis.  ? ? ?OBJECTIVE IMPAIRMENTS Abnormal gait, decreased activity tolerance, decreased balance, decreased ROM, decreased strength, impaired flexibility, and pain.  ? ?ACTIVITY LIMITATIONS community activity and shopping.  ? ?PERSONAL FACTORS Past/current experiences and Time since onset of injury/illness/exacerbation are also affecting patient's functional outcome.  ? ? ?REHAB POTENTIAL: Good ? ?CLINICAL DECISION  MAKING: Stable/uncomplicated ? ?EVALUATION COMPLEXITY: Low ? ? ?GOALS: ?Goals reviewed with patient? Yes ? ?SHORT TERM GOALS: Target date: 09/30/2021 ? ?Patient will be I with initial HEP in order to progress with therapy. ?Baseline: HEP provided at eval ?Goal status: INITIAL ? ?2.  PT will review FOTO with patient by 3rd visit in order to understand expected progress and outcome with therapy. ?Baseline: assessed at eval ?Goal status: INITIAL ? ?3.  Patient will demonstrate right ankle DF >/= 5 deg to improve gait and reduce pain with walking ?Baseline: 3 deg ?Goal status: INITIAL ? ?LONG TERM GOALS: Target date: 10/28/2021 ? ?Patient will be I with final HEP to maintain progress from PT. ?Baseline: HEP provided at eval ?Goal status: INITIAL ? ?2.  Patient will report >/= 66% status on FOTO to indicate improved functional ability. ?Baseline: 53% ?Goal status: INITIAL ? ?3.  Patient will report no limitation with walking to improve community access and exercise. ?Baseline: patient reports limitation with walking due to pain ?Goal status: INITIAL ? ?4.  Patient will demonstrate ankle DF >/= 8 deg to improve gait and reduce pain with walking ?Baseline: 3 deg ?Goal status: INITIAL ? ?5.  Patient will demonstrate ankle strength  5/5 MMT to improve walking and standing tolerance. ?Baseline: patient demonstrates ankle strength deficits (see above) ?Goal status: INITIAL ? ? ?PLAN: ?PT FREQUENCY: 1x/week ? ?PT DURATION: 8 weeks ? ?PLANNED INTERVENTIONS: Therapeutic exercises, Therapeutic activity, Neuromuscular re-education, Balance training, Gait training, Patient/Family education, Joint manipulation, Joint mobilization, Aquatic Therapy, Dry Needling, Spinal manipulation, Spinal mobilization, Cryotherapy, Moist heat, Taping, Ionotophoresis '4mg'$ /ml Dexamethasone, and Manual therapy ? ?PLAN FOR NEXT SESSION: Review HEP and progress PRN, manual/dry needling for foot and calf, calf stretching, initiate foot intrinsic and balance  training, calf and ankle strengthening ? ? ?Hilda Blades, PT, DPT, LAT, ATC ?09/02/21  1:39 PM ?Phone: [phone] ?Fax: [phone] ? ? ?

## 2021-09-02 ENCOUNTER — Encounter: Payer: Self-pay | Admitting: Physical Therapy

## 2021-09-02 ENCOUNTER — Ambulatory Visit: Payer: Medicare Other | Attending: Family Medicine | Admitting: Physical Therapy

## 2021-09-02 ENCOUNTER — Other Ambulatory Visit: Payer: Self-pay

## 2021-09-02 DIAGNOSIS — M722 Plantar fascial fibromatosis: Secondary | ICD-10-CM | POA: Diagnosis not present

## 2021-09-02 DIAGNOSIS — M25671 Stiffness of right ankle, not elsewhere classified: Secondary | ICD-10-CM | POA: Insufficient documentation

## 2021-09-02 DIAGNOSIS — M6281 Muscle weakness (generalized): Secondary | ICD-10-CM | POA: Insufficient documentation

## 2021-09-02 DIAGNOSIS — M79671 Pain in right foot: Secondary | ICD-10-CM | POA: Diagnosis not present

## 2021-09-02 NOTE — Patient Instructions (Signed)
Access Code: [email or web address] ?URL: [email or web address] ?Date: 09/02/2021 ?Prepared by: Hilda Blades ? ?Exercises ?- Standing Gastroc Stretch at [employer]  - 2-3 x daily - 3 reps - 30 seconds hold ?- Heel Raises with Counter Support  - 1 x daily - 3 sets - 20 reps ?- Seated Figure 4 Ankle Inversion with Resistance  - 1 x daily - 3 sets - 10 reps ?- Long Sitting Ankle Plantar Flexion with Resistance  - 1 x daily - 3 sets - 10 reps ?

## 2021-09-03 ENCOUNTER — Encounter: Payer: Self-pay | Admitting: Podiatry

## 2021-09-07 ENCOUNTER — Ambulatory Visit: Payer: Medicare Other

## 2021-09-07 DIAGNOSIS — M25671 Stiffness of right ankle, not elsewhere classified: Secondary | ICD-10-CM

## 2021-09-07 DIAGNOSIS — M722 Plantar fascial fibromatosis: Secondary | ICD-10-CM | POA: Diagnosis not present

## 2021-09-07 DIAGNOSIS — M6281 Muscle weakness (generalized): Secondary | ICD-10-CM

## 2021-09-07 DIAGNOSIS — M79671 Pain in right foot: Secondary | ICD-10-CM

## 2021-09-07 NOTE — Therapy (Signed)
?OUTPATIENT PHYSICAL THERAPY TREATMENT NOTE ? ? ?Patient Name: Kyle Wilcox ?MRN: [id number] ?DOB:20-Sep-1950, 71 y.o., male ?Today's Date: 09/07/2021 ? ?PCP: Susy Frizzle, MD ?REFERRING PROVIDER: Susy Frizzle, MD ? ?END OF SESSION:  ? PT End of Session - 09/07/21 2500   ? ? Visit Number 2   ? Number of Visits 8   ? Date for PT Re-Evaluation 10/28/21   ? Authorization Type MCR   ? Progress Note Due on Visit 10   ? PT Start Time 1000   ? PT Stop Time 3704   ? PT Time Calculation (min) 40 min   ? Activity Tolerance Patient tolerated treatment well   ? Behavior During Therapy [facility] for tasks assessed/performed   ? ?  ?  ? ?  ? ? ?Past Medical History:  ?Diagnosis Date  ? Allergy 2014  ? Arthritis   ? [employer] 02/16/2020  ? Cancer [facility]) 2015  ? prostate cancer  ? Cataract 2016  ? COVID-19 05/2019  ? COVID-19 05/2019  ? DDD (degenerative disc disease), lumbar 12/24/2014  ? GERD (gastroesophageal reflux disease)   ? Glaucoma   ? Hx of colonic polyps 02/19/2011  ? Hyperlipidemia 11/07/2014  ? Peripheral neuropathy 12/24/2014  ? ?Past Surgical History:  ?Procedure Laterality Date  ? COLONOSCOPY    ? EYE SURGERY    ? laser surgery to relieve pressure on eyes- both eyes  ? JOINT REPLACEMENT  2011  ? total shoulder replacement (Right)  ? LAPAROTOMY N/A 01/06/2018  ? Procedure: EXPLORATORY LAPAROTOMY;  Surgeon: Judeth Horn, MD;  Location: [place];  Service: General;  Laterality: N/A;  ? LYSIS OF ADHESION N/A 01/06/2018  ? Procedure: LYSIS OF ADHESION;  Surgeon: Judeth Horn, MD;  Location: [place];  Service: General;  Laterality: N/A;  ? PROSTATE SURGERY    ? 2015  ? SMALL INTESTINE SURGERY N/A   ? [employer] 02/16/2020  ? TONSILLECTOMY    ? TOTAL SHOULDER ARTHROPLASTY Left 03/04/2020  ? Procedure: TOTAL SHOULDER ARTHROPLASTY;  Surgeon: Marchia Bond, MD;  Location: [facility];  Service: Orthopedics;  Laterality: Left;  ? VASECTOMY  1982  ? ?Patient Active Problem List  ? Diagnosis Date Noted  ? S/P shoulder replacement, left 03/04/2020   ? Overweight (BMI 25.0-29.9) 03/28/2018  ? Abdominal distention 01/04/2018  ? Plantar fasciitis of right foot 11/17/2016  ? Chronic insomnia 10/18/2016  ? Male impotence 12/24/2014  ? DDD (degenerative disc disease), lumbar 12/24/2014  ? Peripheral neuropathy 12/24/2014  ? Glaucoma 11/07/2014  ? Hyperlipidemia, unspecified 11/07/2014  ? History of prostate cancer 11/07/2014  ? Hx of colonic polyps 02/19/2011  ? ? ?REFERRING DIAG:  ?Plantar fasciitis ? ?THERAPY DIAG:  ?Pain in right foot ? ?Stiffness of right ankle, not elsewhere classified ? ?Muscle weakness (generalized) ? ?PERTINENT HISTORY:  ?None ? ?PRECAUTIONS:  ?None ? ?SUBJECTIVE:  ?Pt presents to PT with reports of slight R foot pain. Has been compliant with initial HEP with no adverse effect. Pt is ready to begin PT at this time.  ? ?PAIN:  ?Are you having pain? Yes:  ?NPRS scale: 1/10 (can worsen to 9/10 with walking) ?Pain location: Right foot, plantar aspect ?Pain description: Hervey Ard ?Aggravating factors: Walking ?Relieving factors: Heel pad or shoe inserts, rolling foot on frozen bottle, heat ? ? ?OBJECTIVE: (objective measures completed at initial evaluation unless otherwise dated) ? ?DIAGNOSTIC FINDINGS:  ?          MRI Right Ankle 08/26/2021: ?IMPRESSION: ?1. Mild medial band of the  plantar fasciitis. ?2. High-grade partial-thickness anterior talofibular ligament tear, likely chronic. ?3. Partial-thickness cartilage loss within the far anterolateral aspect of the talar dome. ?4. Incidental note of normal variant accessory peroneus quartus muscle and tendon. ?  ?PATIENT SURVEYS:  ?FOTO 53% functional status ?  ?COGNITION: ?Overall cognitive status: Within functional limits for tasks assessed               ?           ?SENSATION: ?WFL ?  ?MUSCLE LENGTH: ?Calf flexibility deficit ?  ?POSTURE:  ?Patient exhibits bilateral bunions and pes planus ?  ?PALPATION: ?Tender to right calcaneal tubercle region ?  ?LE ROM: ?  ?Active ROM Right ?09/02/2021  Left ?09/02/2021  ?Ankle dorsiflexion 3 5  ?Ankle plantarflexion 55 55  ?Ankle inversion 35 35  ?Ankle eversion 15 15  ?  ?LE MMT: ?  ?MMT Right ?09/02/2021 Left ?09/02/2021  ?Hip flexion 4 4  ?Hip extension 4 4  ?Hip abduction 4 4  ?Ankle dorsiflexion 5 5  ?Ankle plantarflexion 4 4-  ?Ankle inversion 4+ 4+  ?Ankle eversion 5 5  ?  ?FUNCTIONAL TESTS:  ?SLS < 5 sec bilaterally ?  ?GAIT: ?Assistive device utilized: None ?Level of assistance: Complete Independence ?Comments: Slightly antalgic on right, bilateral toe out with increased pronation ?  ?  ?TODAY'S TREATMENT: ?Therapeutic Exercise:  ?Recumbent bike lvl 3 x 4 min while taking subjective ?Standing calf stretch on wedge 2x30" ?Standing soleus stretch 2x30"  ?Standing heel raises 2x15 ?Tandem stance 2x30" ?Wobble board 2x10 fwd/bwd ?Heel lowering on 2in step 2x10 ?Long sitting ankle DF/Inv/Ev 2x15 GTB ?Longsitting ankle PF with black 2x15 ?BAPS L3 cw/ccw  ?  ?PATIENT EDUCATION:  ?Education details: Exam findings, POC, HEP, use of TPDN for future treatment, shoe inserts ?Person educated: Patient ?Education method: Explanation, Demonstration, Tactile cues, Verbal cues, and Handouts ?Education comprehension: verbalized understanding, returned demonstration, verbal cues required, tactile cues required, and needs further education ?  ?HOME EXERCISE PROGRAM: ?Access Code: [email or web address] ?URL: [email or web address] ?Date: 09/07/2021 ?Prepared by: Octavio Manns ? ?Exercises ?- Standing Gastroc Stretch at [employer]  - 2-3 x daily - 3 reps - 30 seconds hold ?- Heel Raises with Counter Support  - 1 x daily - 3 sets - 20 reps ?- Seated Figure 4 Ankle Inversion with Resistance  - 1 x daily - 3 sets - 10 reps ?- Long Sitting Ankle Plantar Flexion with Resistance  - 1 x daily - 3 sets - 10 reps ?- Ankle Dorsiflexion with Resistance  - 1 x daily - 7 x weekly - 2 sets - 15 reps ?- Ankle Eversion with Resistance  - 1 x daily - 7 x weekly - 2 sets - 15 reps ?  ?   ?ASSESSMENT: ?CLINICAL IMPRESSION: ?Pt was able to complete all prescribed exercises with no adverse effect. He noted decreased pain post session, with therapy focusing on improving distal LE strength, balance, and muscle length. He continues to benefit from skilled PT services and will continue to be seen and progressed as able.  ?  ?  ?OBJECTIVE IMPAIRMENTS Abnormal gait, decreased activity tolerance, decreased balance, decreased ROM, decreased strength, impaired flexibility, and pain.  ?  ?ACTIVITY LIMITATIONS community activity and shopping.  ?  ?PERSONAL FACTORS Past/current experiences and Time since onset of injury/illness/exacerbation are also affecting patient's functional outcome.  ?  ? ?GOALS: ?Goals reviewed with patient? Yes ?  ?SHORT TERM GOALS: Target date: 09/30/2021 ?  ?Patient will be I with initial HEP  in order to progress with therapy. ?Baseline: HEP provided at eval ?Goal status: INITIAL ?  ?2.  PT will review FOTO with patient by 3rd visit in order to understand expected progress and outcome with therapy. ?Baseline: assessed at eval ?Goal status: INITIAL ?  ?3.  Patient will demonstrate right ankle DF >/= 5 deg to improve gait and reduce pain with walking ?Baseline: 3 deg ?Goal status: INITIAL ?  ?LONG TERM GOALS: Target date: 10/28/2021 ?  ?Patient will be I with final HEP to maintain progress from PT. ?Baseline: HEP provided at eval ?Goal status: INITIAL ?  ?2.  Patient will report >/= 66% status on FOTO to indicate improved functional ability. ?Baseline: 53% ?Goal status: INITIAL ?  ?3.  Patient will report no limitation with walking to improve community access and exercise. ?Baseline: patient reports limitation with walking due to pain ?Goal status: INITIAL ?  ?4.  Patient will demonstrate ankle DF >/= 8 deg to improve gait and reduce pain with walking ?Baseline: 3 deg ?Goal status: INITIAL ?  ?5.  Patient will demonstrate ankle strength 5/5 MMT to improve walking and standing  tolerance. ?Baseline: patient demonstrates ankle strength deficits (see above) ?Goal status: INITIAL ?  ?  ?PLAN: ?PT FREQUENCY: 1x/week ?  ?PT DURATION: 8 weeks ?  ?PLANNED INTERVENTIONS: Therapeutic exercises, Therapeutic activity, Neuro

## 2021-09-10 ENCOUNTER — Ambulatory Visit (INDEPENDENT_AMBULATORY_CARE_PROVIDER_SITE_OTHER): Payer: Medicare Other

## 2021-09-10 DIAGNOSIS — M76812 Anterior tibial syndrome, left leg: Secondary | ICD-10-CM

## 2021-09-10 DIAGNOSIS — M722 Plantar fascial fibromatosis: Secondary | ICD-10-CM | POA: Diagnosis not present

## 2021-09-10 DIAGNOSIS — M76822 Posterior tibial tendinitis, left leg: Secondary | ICD-10-CM

## 2021-09-10 NOTE — Progress Notes (Signed)
SITUATION ?Reason for Consult: Evaluation for Bilateral Custom Foot Orthoses ?Patient / Caregiver Report: Patient is ready for foot orthotics ? ?OBJECTIVE DATA: ?Patient History / Diagnosis:  ?  ICD-10-CM   ?1. Plantar fasciitis of right foot  M72.2   ?  ?2. Plantar fasciitis, left  M72.2   ?  ?3. Posterior tibial tendinitis of left lower extremity  M76.822   ?  ?4. Anterior tibialis tendonitis of left leg  M76.812   ?  ? ? ?Current or Previous Devices:   None and no history ? ?Foot Examination: ?Skin presentation:   Intact ?Ulcers & Callousing:   None ?Toe / Foot Deformities:  None ?Weight Bearing Presentation:  Rectus ?Sensation:    Intact ? ?Shoe Size:    10.5W ? ?ORTHOTIC RECOMMENDATION ?Recommended Device: 1x pair of custom functional foot orthotics ? ?GOALS OF ORTHOSES ?- Reduce Pain ?- Prevent Foot Deformity ?- Prevent Progression of Further Foot Deformity ?- Relieve Pressure ?- Improve the Overall Biomechanical Function of the Foot and Lower Extremity. ? ?ACTIONS PERFORMED ?Potential out of pocket cost was communicated to patient. Patient understood and consent to casting. Patient was casted for Foot Orthoses via crush box. Procedure was explained and patient tolerated procedure well. Casts were shipped to central fabrication. All questions were answered and concerns addressed. ? ?PLAN ?Patient is to be called for fitting when devices are ready.  ? ? ?

## 2021-09-22 ENCOUNTER — Ambulatory Visit: Payer: Medicare Other | Attending: Family Medicine

## 2021-09-22 DIAGNOSIS — M25671 Stiffness of right ankle, not elsewhere classified: Secondary | ICD-10-CM

## 2021-09-22 DIAGNOSIS — M79671 Pain in right foot: Secondary | ICD-10-CM

## 2021-09-22 DIAGNOSIS — M6281 Muscle weakness (generalized): Secondary | ICD-10-CM

## 2021-09-22 NOTE — Therapy (Signed)
?OUTPATIENT PHYSICAL THERAPY TREATMENT NOTE ? ? ?Patient Name: Kyle Wilcox ?MRN: [id number] ?DOB:November 29, 1950, 71 y.o., male ?Today's Date: 09/22/2021 ? ?PCP: Susy Frizzle, MD ?REFERRING PROVIDER: Susy Frizzle, MD ? ?END OF SESSION:  ? PT End of Session - 09/22/21 1354   ? ? Visit Number 3   ? Number of Visits 8   ? Date for PT Re-Evaluation 10/28/21   ? Authorization Type MCR   ? Progress Note Due on Visit 10   ? PT Start Time 1400   ? PT Stop Time 4166   ? PT Time Calculation (min) 39 min   ? Activity Tolerance Patient tolerated treatment well   ? Behavior During Therapy [facility] for tasks assessed/performed   ? ?  ?  ? ?  ? ? ? ?Past Medical History:  ?Diagnosis Date  ? Allergy 2014  ? Arthritis   ? Phreesia 02/16/2020  ? Cancer [facility]) 2015  ? prostate cancer  ? Cataract 2016  ? COVID-19 05/2019  ? COVID-19 05/2019  ? DDD (degenerative disc disease), lumbar 12/24/2014  ? GERD (gastroesophageal reflux disease)   ? Glaucoma   ? Hx of colonic polyps 02/19/2011  ? Hyperlipidemia 11/07/2014  ? Peripheral neuropathy 12/24/2014  ? ?Past Surgical History:  ?Procedure Laterality Date  ? COLONOSCOPY    ? EYE SURGERY    ? laser surgery to relieve pressure on eyes- both eyes  ? JOINT REPLACEMENT  2011  ? total shoulder replacement (Right)  ? LAPAROTOMY N/A 01/06/2018  ? Procedure: EXPLORATORY LAPAROTOMY;  Surgeon: Judeth Horn, MD;  Location: [place];  Service: General;  Laterality: N/A;  ? LYSIS OF ADHESION N/A 01/06/2018  ? Procedure: LYSIS OF ADHESION;  Surgeon: Judeth Horn, MD;  Location: [place];  Service: General;  Laterality: N/A;  ? PROSTATE SURGERY    ? 2015  ? SMALL INTESTINE SURGERY N/A   ? Phreesia 02/16/2020  ? TONSILLECTOMY    ? TOTAL SHOULDER ARTHROPLASTY Left 03/04/2020  ? Procedure: TOTAL SHOULDER ARTHROPLASTY;  Surgeon: Marchia Bond, MD;  [facility]: [facility];  Service: Orthopedics;  Laterality: Left;  ? VASECTOMY  1982  ? ?Patient Active Problem List  ? Diagnosis Date Noted  ? S/P shoulder replacement, left  03/04/2020  ? Overweight (BMI 25.0-29.9) 03/28/2018  ? Abdominal distention 01/04/2018  ? Plantar fasciitis of right foot 11/17/2016  ? Chronic insomnia 10/18/2016  ? Male impotence 12/24/2014  ? DDD (degenerative disc disease), lumbar 12/24/2014  ? Peripheral neuropathy 12/24/2014  ? Glaucoma 11/07/2014  ? Hyperlipidemia, unspecified 11/07/2014  ? History of prostate cancer 11/07/2014  ? Hx of colonic polyps 02/19/2011  ? ? ?REFERRING DIAG:  ?Plantar fasciitis ? ?THERAPY DIAG:  ?Pain in right foot ? ?Stiffness of right ankle, not elsewhere classified ? ?Muscle weakness (generalized) ? ?PERTINENT HISTORY:  ?None ? ?PRECAUTIONS:  ?None ? ?SUBJECTIVE:  ?Pt presents to PT with a slight increase in soreness after golfing for a few days. Has been compliant with HEP with no adverse effect. Pt is ready to begin PT at this time. ? ?PAIN:  ?Are you having pain? Yes:  ?NPRS scale: 6/10 (can worsen to 9/10 with walking) ?Pain location: Right foot, plantar aspect ?Pain description: Hervey Ard ?Aggravating factors: Walking ?Relieving factors: Heel pad or shoe inserts, rolling foot on frozen bottle, heat ? ? ?OBJECTIVE: (objective measures completed at initial evaluation unless otherwise dated) ? ?PATIENT SURVEYS:  ?FOTO 53% functional status ?  ?COGNITION: ?Overall cognitive status: Within functional limits for tasks assessed               ?           ?  SENSATION: ?WFL ?  ?MUSCLE LENGTH: ?Calf flexibility deficit ?  ?POSTURE:  ?Patient exhibits bilateral bunions and pes planus ?  ?PALPATION: ?Tender to right calcaneal tubercle region ?  ?LE ROM: ?  ?Active ROM Right ?09/02/2021 Left ?09/02/2021  ?Ankle dorsiflexion 3 5  ?Ankle plantarflexion 55 55  ?Ankle inversion 35 35  ?Ankle eversion 15 15  ?  ?LE MMT: ?  ?MMT Right ?09/02/2021 Left ?09/02/2021  ?Hip flexion 4 4  ?Hip extension 4 4  ?Hip abduction 4 4  ?Ankle dorsiflexion 5 5  ?Ankle plantarflexion 4 4-  ?Ankle inversion 4+ 4+  ?Ankle eversion 5 5  ?  ?FUNCTIONAL TESTS:  ?SLS < 5 sec  bilaterally ?  ?GAIT: ?Assistive device utilized: None ?Level of assistance: Complete Independence ?Comments: Slightly antalgic on right, bilateral toe out with increased pronation ?  ?  ?TODAY'S TREATMENT: ?[facility] Adult PT Treatment:                                                DATE: 09/22/2021 ?Therapeutic Exercise:  ?Recumbent bike lvl 3 x 4 min while taking subjective ?Standing calf stretch on wedge 2x45" ?Standing soleus stretch 2x30"  ?Standing heel raises with tennis ball between ankles 2x15 ?Tandem stance on foam 2x30" ?Wobble board 2x10 fwd/bwd ?Heel lowering on 4in step 2x10 ?Long sitting ankle Inv/Ev 2x15 GTB R ?Longsitting ankle PF with black 2x15 ?BAPS L3 cw/ccw 2x20 R ?STS 2x10 - no UE support  ?Calf raises on leg press 2x10 35#  ? ?[facility] Adult PT Treatment:                                                DATE: 09/07/2021 ?Therapeutic Exercise:  ?Recumbent bike lvl 3 x 4 min while taking subjective ?Standing calf stretch on wedge 2x30" ?Standing soleus stretch 2x30"  ?Standing heel raises 2x15 ?Tandem stance 2x30" ?Wobble board 2x10 fwd/bwd ?Heel lowering on 2in step 2x10 ?Long sitting ankle DF/Inv/Ev 2x15 GTB ?Longsitting ankle PF with black 2x15 ?BAPS L3 cw/ccw  ? ?PATIENT EDUCATION:  ?Education details: Exam findings, POC, HEP, use of TPDN for future treatment, shoe inserts ?Person educated: Patient ?Education method: Explanation, Demonstration, Tactile cues, Verbal cues, and Handouts ?Education comprehension: verbalized understanding, returned demonstration, verbal cues required, tactile cues required, and needs further education ?  ?HOME EXERCISE PROGRAM: ?Access Code: [email or web address] ?URL: [email or web address] ?Date: 09/07/2021 ?Prepared by: Octavio Manns ? ?Exercises ?- Standing Gastroc Stretch at [employer]  - 2-3 x daily - 3 reps - 30 seconds hold ?- Heel Raises with Counter Support  - 1 x daily - 3 sets - 20 reps ?- Seated Figure 4 Ankle Inversion with Resistance  - 1 x daily - 3 sets - 10  reps ?- Long Sitting Ankle Plantar Flexion with Resistance  - 1 x daily - 3 sets - 10 reps ?- Ankle Dorsiflexion with Resistance  - 1 x daily - 7 x weekly - 2 sets - 15 reps ?- Ankle Eversion with Resistance  - 1 x daily - 7 x weekly - 2 sets - 15 reps ?  ?  ?ASSESSMENT: ?CLINICAL IMPRESSION: ?Pt was once again able to complete all prescribed exercises with no adverse effect or increase in pain. Therapy today  continued to focus on improving distal LE strength and calf muscle length in order to decrease pain and improve functional ability. Pt is progressing very well with therapy and will continue to be seen and progressed as able. ?  ?  ?OBJECTIVE IMPAIRMENTS Abnormal gait, decreased activity tolerance, decreased balance, decreased ROM, decreased strength, impaired flexibility, and pain.  ?  ?ACTIVITY LIMITATIONS community activity and shopping.  ?  ?PERSONAL FACTORS Past/current experiences and Time since onset of injury/illness/exacerbation are also affecting patient's functional outcome.  ?  ? ?GOALS: ?Goals reviewed with patient? Yes ?  ?SHORT TERM GOALS: Target date: 09/30/2021 ?  ?Patient will be I with initial HEP in order to progress with therapy. ?Baseline: HEP provided at eval ?Goal status: INITIAL ?  ?2.  PT will review FOTO with patient by 3rd visit in order to understand expected progress and outcome with therapy. ?Baseline: assessed at eval ?Goal status: INITIAL ?  ?3.  Patient will demonstrate right ankle DF >/= 5 deg to improve gait and reduce pain with walking ?Baseline: 3 deg ?Goal status: INITIAL ?  ?LONG TERM GOALS: Target date: 10/28/2021 ?  ?Patient will be I with final HEP to maintain progress from PT. ?Baseline: HEP provided at eval ?Goal status: INITIAL ?  ?2.  Patient will report >/= 66% status on FOTO to indicate improved functional ability. ?Baseline: 53% ?Goal status: INITIAL ?  ?3.  Patient will report no limitation with walking to improve community access and exercise. ?Baseline: patient  reports limitation with walking due to pain ?Goal status: INITIAL ?  ?4.  Patient will demonstrate ankle DF >/= 8 deg to improve gait and reduce pain with walking ?Baseline: 3 deg ?Goal status: INITIAL ?  ?5.  P

## 2021-09-23 ENCOUNTER — Encounter: Payer: Self-pay | Admitting: Family Medicine

## 2021-09-25 NOTE — Therapy (Signed)
?OUTPATIENT PHYSICAL THERAPY TREATMENT NOTE ? ? ?Patient Name: DAILEN MCCLISH ?MRN: [id number] ?DOB:May 03, 1951, 71 y.o., male ?Today's Date: 09/28/2021 ? ?PCP: Susy Frizzle, MD ?REFERRING PROVIDER: Susy Frizzle, MD ? ?END OF SESSION:  ? PT End of Session - 09/28/21 0917   ? ? Visit Number 4   ? Number of Visits 8   ? Date for PT Re-Evaluation 10/28/21   ? Authorization Type MCR   ? Progress Note Due on Visit 10   ? PT Start Time 2725   ? PT Stop Time 0955   ? PT Time Calculation (min) 40 min   ? Activity Tolerance Patient tolerated treatment well   ? Behavior During Therapy [facility] for tasks assessed/performed   ? ?  ?  ? ?  ? ? ? ? ?Past Medical History:  ?Diagnosis Date  ? Allergy 2014  ? Arthritis   ? Phreesia 02/16/2020  ? Cancer [facility]) 2015  ? prostate cancer  ? Cataract 2016  ? COVID-19 05/2019  ? COVID-19 05/2019  ? DDD (degenerative disc disease), lumbar 12/24/2014  ? GERD (gastroesophageal reflux disease)   ? Glaucoma   ? Hx of colonic polyps 02/19/2011  ? Hyperlipidemia 11/07/2014  ? Peripheral neuropathy 12/24/2014  ? ?Past Surgical History:  ?Procedure Laterality Date  ? COLONOSCOPY    ? EYE SURGERY    ? laser surgery to relieve pressure on eyes- both eyes  ? JOINT REPLACEMENT  2011  ? total shoulder replacement (Right)  ? LAPAROTOMY N/A 01/06/2018  ? Procedure: EXPLORATORY LAPAROTOMY;  Surgeon: Judeth Horn, MD;  Location: [place];  Service: General;  Laterality: N/A;  ? LYSIS OF ADHESION N/A 01/06/2018  ? Procedure: LYSIS OF ADHESION;  Surgeon: Judeth Horn, MD;  Location: [place];  Service: General;  Laterality: N/A;  ? PROSTATE SURGERY    ? 2015  ? SMALL INTESTINE SURGERY N/A   ? Phreesia 02/16/2020  ? TONSILLECTOMY    ? TOTAL SHOULDER ARTHROPLASTY Left 03/04/2020  ? Procedure: TOTAL SHOULDER ARTHROPLASTY;  Surgeon: Marchia Bond, MD;  [facility];  Service: Orthopedics;  Laterality: Left;  ? VASECTOMY  1982  ? ?Patient Active Problem List  ? Diagnosis Date Noted  ? S/P shoulder replacement, left  03/04/2020  ? Overweight (BMI 25.0-29.9) 03/28/2018  ? Abdominal distention 01/04/2018  ? Plantar fasciitis of right foot 11/17/2016  ? Chronic insomnia 10/18/2016  ? Male impotence 12/24/2014  ? DDD (degenerative disc disease), lumbar 12/24/2014  ? Peripheral neuropathy 12/24/2014  ? Glaucoma 11/07/2014  ? Hyperlipidemia, unspecified 11/07/2014  ? History of prostate cancer 11/07/2014  ? Hx of colonic polyps 02/19/2011  ? ? ?REFERRING DIAG:  ?Plantar fasciitis ? ?THERAPY DIAG:  ?Pain in right foot ? ?Stiffness of right ankle, not elsewhere classified ? ?Muscle weakness (generalized) ? ?PERTINENT HISTORY:  ?None ? ?PRECAUTIONS:  ?None ? ?SUBJECTIVE:  ?Patient reports he is doing well. He still does have pain and walks "gingerly" when on hard floors, but overall is feeling better. ? ?PAIN:  ?Are you having pain? Yes:  ?NPRS scale: 4/10 (can worsen to 9/10 with walking) ?Pain location: Right foot, plantar aspect ?Pain description: Hervey Ard ?Aggravating factors: Walking ?Relieving factors: Heel pad or shoe inserts, rolling foot on frozen bottle, heat ? ? ?OBJECTIVE: (objective measures completed at initial evaluation unless otherwise dated) ? ?PATIENT SURVEYS:  ?FOTO 53% functional status ?  ?MUSCLE LENGTH: ?Calf flexibility deficit ?  ?POSTURE:  ?Patient exhibits bilateral bunions and pes planus ?  ?PALPATION: ?Tender to  right calcaneal tubercle region, trigger points noted in right calf ?  ?LE ROM: ?  ?Active ROM Right ?09/02/2021 Left ?09/02/2021 Rt ?09/28/2021  ?Ankle dorsiflexion '3 5 5  '$ ?Ankle plantarflexion 55 55   ?Ankle inversion 35 35   ?Ankle eversion 15 15   ?  ?LE MMT: ?  ?MMT Right ?09/02/2021 Left ?09/02/2021  ?Hip flexion 4 4  ?Hip extension 4 4  ?Hip abduction 4 4  ?Ankle dorsiflexion 5 5  ?Ankle plantarflexion 4 4-  ?Ankle inversion 4+ 4+  ?Ankle eversion 5 5  ?  ?FUNCTIONAL TESTS:  ?SLS < 5 sec bilaterally ?09/28/2021: 17 seconds on right ?  ?GAIT: ?Assistive device utilized: None ?Level of assistance:  Complete Independence ?Comments: Slightly antalgic on right, bilateral toe out with increased pronation ?  ?  ?TODAY'S TREATMENT: ?[facility] Adult PT Treatment:                                                DATE: 09/28/2021 ?Therapeutic Exercise:  ?Recumbent bike L3 x 4 min while taking subjective ?Slant board calf stretch 3 x 30 sec ?Standing soleus stretch 2 x 30 sec ?Standing heel raises with tennis ball between heels 3 x 20 ?Eccentric heel raise 2 x 15 ?SLS 3 x 30 sec each ?Seated figure-4 inversion with blue 2 x 20 ?Manual: ?Skilled palpation and monitoring of muscle tension while performing TPDN treatment ?STM and MFR for right calf ?Trigger Point Dry Needling Treatment: ?Pre-treatment instruction: Patient instructed on dry needling rationale, procedures, and possible side effects including pain during treatment (achy,cramping feeling), bruising, drop of blood, lightheadedness, nausea, sweating. ?Patient Consent Given: Yes ?Education handout provided: No ?Muscles treated: Right gastroc and soleus  ?Needle size and number: .30x16m x 6 ?Electrical stimulation performed: No ?Parameters: N/A ?Treatment response/outcome: Twitch response elicited, Palpable decrease in muscle tension, and patient report if improved symptoms ?Post-treatment instructions: Patient instructed to expect possible mild to moderate muscle soreness later today and/or tomorrow. Patient instructed in methods to reduce muscle soreness and to continue prescribed HEP. If patient was dry needled over the lung field, patient was instructed on signs and symptoms of pneumothorax and, however unlikely, to see immediate medical attention should they occur. Patient was also educated on signs and symptoms of infection and to seek medical attention should they occur. Patient verbalized understanding of these instructions and education. ? ? ? [facility]                                                [facility] ?Therapeutic Exercise:   ?Recumbent bike lvl 3 x 4 min while taking subjective ?Standing calf stretch on wedge 2x45" ?Standing soleus stretch 2x30"  ?Standing heel raises with tennis ball between ankles 2x15 ?Tandem stance on foam 2x30" ?Wobble board 2x10 fwd/bwd ?Heel lowering on 4in step 2x10 ?Long sitting ankle Inv/Ev 2x15 GTB R ?Longsitting ankle PF with black 2x15 ?BAPS L3 cw/ccw 2x20 R ?STS 2x10 - no UE support  ?Calf raises on leg press 2x10 35#  ? [facility]  [facility] ?Therapeutic Exercise:  ?Recumbent bike lvl 3 x 4 min while taking subjective ?Standing calf stretch on wedge 2x30" ?Standing soleus stretch 2x30"  ?Standing heel raises 2x15 ?Tandem stance 2x30" ?Wobble board 2x10 fwd/bwd ?Heel lowering on 2in step 2x10 ?Long sitting ankle DF/Inv/Ev 2x15 GTB ?Longsitting ankle PF with black 2x15 ?BAPS L3 cw/ccw  ? ?PATIENT EDUCATION:  ?Education details: HEP update, TPDN ?Person educated: Patient ?Education method: Explanation, Demonstration, Tactile cues, Verbal cues, and Handouts ?Education comprehension: verbalized understanding, returned demonstration, verbal cues required, tactile cues required, and needs further education ?  ?HOME EXERCISE PROGRAM: ?Access Code: [email or web address] ?  ?  ?ASSESSMENT: ?CLINICAL IMPRESSION: ?Patient tolerated therapy well with no adverse effects. Performed TPDN this visit for right gastroc and soleus with good therapeutic benefit. Patient instructed on using tennis ball for SMFR and continued stretching for calf mobility. Progressed ankle strengthening and stability this visit with good tolerance. Updated HEP this visit. Patient would benefit from continued skilled PT to progress mobility and strength in order to reduce pain and maximize functional ability. ?  ?  ?OBJECTIVE IMPAIRMENTS Abnormal gait, decreased activity tolerance, decreased balance, decreased ROM, decreased strength, impaired flexibility, and pain.  ?  ?ACTIVITY LIMITATIONS  community activity and shopping.  ?  ?PERSONAL FACTORS Past/current experiences and Time since onset of injury/illness/exacerbation are also affecting patient's functional outcome.  ?  ? ?GOALS: ?  ?SHORT TERM GOALS: Theola Sequin

## 2021-09-28 ENCOUNTER — Ambulatory Visit: Payer: Medicare Other | Admitting: Physical Therapy

## 2021-09-28 ENCOUNTER — Other Ambulatory Visit: Payer: Self-pay

## 2021-09-28 ENCOUNTER — Ambulatory Visit (INDEPENDENT_AMBULATORY_CARE_PROVIDER_SITE_OTHER): Payer: Medicare Other | Admitting: Family Medicine

## 2021-09-28 ENCOUNTER — Encounter: Payer: Self-pay | Admitting: Physical Therapy

## 2021-09-28 VITALS — BP 122/80 | HR 93 | Temp 98.2°F | Ht 68.0 in | Wt 183.0 lb

## 2021-09-28 DIAGNOSIS — M6281 Muscle weakness (generalized): Secondary | ICD-10-CM | POA: Diagnosis not present

## 2021-09-28 DIAGNOSIS — M25671 Stiffness of right ankle, not elsewhere classified: Secondary | ICD-10-CM

## 2021-09-28 DIAGNOSIS — M79671 Pain in right foot: Secondary | ICD-10-CM

## 2021-09-28 DIAGNOSIS — L719 Rosacea, unspecified: Secondary | ICD-10-CM

## 2021-09-28 MED ORDER — METRONIDAZOLE 0.75 % EX GEL: 1.0000 "application " | Freq: Two times a day (BID) | CUTANEOUS | 11 refills | Status: AC

## 2021-09-28 NOTE — Patient Instructions (Signed)
Access Code: [email or web address] ?URL: [email or web address] ?Date: 09/28/2021 ?Prepared by: Hilda Blades ? ?Exercises ?- Standing Gastroc Stretch at [employer]  - 2-3 x daily - 3 reps - 30 seconds hold ?- Seated Figure 4 Ankle Inversion with Resistance  - 1 x daily - 3 sets - 10 reps ?- Long Sitting Ankle Plantar Flexion with Resistance  - 1 x daily - 3 sets - 10 reps ?- Ankle Dorsiflexion with Resistance  - 1 x daily - 2 sets - 15 reps ?- Ankle Eversion with Resistance  - 1 x daily - 2 sets - 15 reps ?- Standing Calf Raise With Small Ball at [place] 1 x daily - 3 sets - 20 reps ?- Calf Mobilization with Small Ball  ?

## 2021-09-28 NOTE — Progress Notes (Signed)
? ?Subjective:  ? ? Patient ID: Kyle Wilcox, male    DOB: Jun 30, 1950, 71 y.o.   MRN: [id number] ? ?Patient reports erythema on both cheeks, his nose.  Has been like this from over a month.  Occasionally will have papules in the morning.  He does not drink.  He is outside in the sun.  He denies any other triggers.  He has no history of lupus.  He denies any muscle pains or joint pains. ?Past Medical History:  ?Diagnosis Date  ? Allergy 2014  ? Arthritis   ? Phreesia 02/16/2020  ? Cancer [facility]) 2015  ? prostate cancer  ? Cataract 2016  ? COVID-19 05/2019  ? COVID-19 05/2019  ? DDD (degenerative disc disease), lumbar 12/24/2014  ? GERD (gastroesophageal reflux disease)   ? Glaucoma   ? Hx of colonic polyps 02/19/2011  ? Hyperlipidemia 11/07/2014  ? Peripheral neuropathy 12/24/2014  ? ?Past Surgical History:  ?Procedure Laterality Date  ? COLONOSCOPY    ? EYE SURGERY    ? laser surgery to relieve pressure on eyes- both eyes  ? JOINT REPLACEMENT  2011  ? total shoulder replacement (Right)  ? LAPAROTOMY N/A 01/06/2018  ? Procedure: EXPLORATORY LAPAROTOMY;  Surgeon: Judeth Horn, MD;  Location: [place];  Service: General;  Laterality: N/A;  ? LYSIS OF ADHESION N/A 01/06/2018  ? Procedure: LYSIS OF ADHESION;  Surgeon: Judeth Horn, MD;  Location: [place];  Service: General;  Laterality: N/A;  ? PROSTATE SURGERY    ? 2015  ? SMALL INTESTINE SURGERY N/A   ? Phreesia 02/16/2020  ? TONSILLECTOMY    ? TOTAL SHOULDER ARTHROPLASTY Left 03/04/2020  ? Procedure: TOTAL SHOULDER ARTHROPLASTY;  Surgeon: Marchia Bond, MD;  [facility];  Service: Orthopedics;  Laterality: Left;  ? VASECTOMY  1982  ? ?Current Outpatient Medications on File Prior to Visit  ?Medication Sig Dispense Refill  ? aspirin 81 MG EC tablet Take 81 mg by mouth daily. Swallow whole.    ? atorvastatin (LIPITOR) 40 MG tablet TAKE 1 TABLET DAILY AT 6PM 90 tablet 2  ? diphenhydrAMINE (BENADRYL) 25 MG tablet Take 25 mg by mouth See admin instructions. Take 25 mg daily, may  take a second 25 mg dose as needed for allergies    ? dorzolamide-timolol (COSOPT) 22.3-6.8 MG/ML ophthalmic solution Place 1 drop into both eyes 2 (two) times daily.     ? FIBER PO Take 1 capsule by mouth 2 (two) times daily.    ? gabapentin (NEURONTIN) 600 MG tablet Take 1 tablet (600 mg total) by mouth 2 (two) times daily. 180 tablet 3  ? Latanoprostene Bunod 0.024 % SOLN Place 1 drop into both eyes at bedtime.     ? meloxicam (MOBIC) 15 MG tablet Take 1 tablet (15 mg total) by mouth daily. 30 tablet 3  ? Omega-3 Fatty Acids (OMEGA-3 FISH OIL) 300 MG CAPS Take 300 mg by mouth daily.    ? ONE DAILY MULTIPLE VITAMIN PO Take 1 tablet by mouth daily.     ? terbinafine (LAMISIL) 250 MG tablet Take 1 tablet (250 mg total) by mouth daily. 90 tablet 0  ? traZODone (DESYREL) 50 MG tablet TAKE 1/2 TO 1 (ONE-HALF TO ONE) TABLET BY MOUTH AT BEDTIME AS NEEDED FOR SLEEP 90 tablet 1  ? vitamin C (ASCORBIC ACID) 500 MG tablet Take 500 mg by mouth daily.    ? ?No current facility-administered medications on file prior to visit.  ? ?.all ?Social History  ? ?  Socioeconomic History  ? Marital status: Married  ?  Spouse name: Not on file  ? Number of children: Not on file  ? Years of education: Not on file  ? Highest education level: Not on file  ?Occupational History  ? Not on file  ?Tobacco Use  ? Smoking status: Former  ?  Types: Cigarettes  ?  Quit date: 03/09/1981  ?  Years since quitting: 40.5  ? Smokeless tobacco: Never  ?Vaping Use  ? Vaping Use: Never used  ?Substance and Sexual Activity  ? Alcohol use: Yes  ?  Alcohol/week: 2.0 standard drinks  ?  Types: 2 Cans of beer per week  ?  Comment: occasionally  ? Drug use: No  ? Sexual activity: Not Currently  ?Other Topics Concern  ? Not on file  ?Social History Narrative  ? Not on file  ? ?Social Determinants of Health  ? ?Financial Resource Strain: Not on file  ?Food Insecurity: Not on file  ?Transportation Needs: Not on file  ?Physical Activity: Not on file  ?Stress: Not on  file  ?Social Connections: Not on file  ?Intimate Partner Violence: Not on file  ? ? ? ?Review of Systems  ?All other systems reviewed and are negative. ? ?   ?Objective:  ? Physical Exam ?Vitals reviewed.  ?Constitutional:   ?   Appearance: Normal appearance.  ?HENT:  ?   Head:  ? ?Cardiovascular:  ?   Rate and Rhythm: Normal rate and regular rhythm.  ?   Heart sounds: Normal heart sounds.  ?Pulmonary:  ?   Breath sounds: Normal breath sounds.  ?Musculoskeletal:  ?     Feet: ? ?Skin: ?   Findings: Erythema present.  ?Neurological:  ?   Mental Status: He is alert.  ? ? ? ? ? ?   ?Assessment & Plan:  ?Rosacea ?Patient primarily has erythema.  We will try MetroGel 0.75% twice daily for 3 to 4 weeks and see how the patient is doing.  I recommended liberal use of sunscreen and sun avoidance and also decrease alcohol consumption.  If this is not helping we could either try brimonidine or azelaic acid.  Avoid oral doxycycline unless the topical medication is unsuccessful ? ?

## 2021-10-01 NOTE — Therapy (Signed)
OUTPATIENT PHYSICAL THERAPY TREATMENT NOTE   Patient Name: Kyle Wilcox MRN: [id number] DOB:[birth year], 71 y.o., male Today's Date: 10/05/2021  PCP: Susy Frizzle, MD REFERRING PROVIDER: Susy Frizzle, MD  END OF SESSION:   PT End of Session - 10/05/21 0911     Visit Number 5    Number of Visits 8    Date for PT Re-Evaluation 10/28/21    Authorization Type MCR    Progress Note Due on Visit 10    PT Start Time 0915    PT Stop Time 7341    PT Time Calculation (min) 40 min    Activity Tolerance Patient tolerated treatment well    Behavior During Therapy [facility] for tasks assessed/performed                Past Medical History:  Diagnosis Date   Allergy 2014   Arthritis    Phreesia 02/16/2020   Cancer ([place]) 2015   prostate cancer   Cataract 2016   COVID-19 05/2019   COVID-19 05/2019   DDD (degenerative disc disease), lumbar 12/24/2014   GERD (gastroesophageal reflux disease)    Glaucoma    Hx of colonic polyps 02/19/2011   Hyperlipidemia 11/07/2014   Peripheral neuropathy 12/24/2014   Past Surgical History:  Procedure Laterality Date   COLONOSCOPY     EYE SURGERY     laser surgery to relieve pressure on eyes- both eyes   JOINT REPLACEMENT  2011   total shoulder replacement (Right)   LAPAROTOMY N/A 01/06/2018   Procedure: EXPLORATORY LAPAROTOMY;  Surgeon: Judeth Horn, MD;  Location: [place];  Service: General;  Laterality: N/A;   LYSIS OF ADHESION N/A 01/06/2018   Procedure: LYSIS OF ADHESION;  Surgeon: Judeth Horn, MD;  Location: [place];  Service: General;  Laterality: N/A;   PROSTATE SURGERY     2015   SMALL INTESTINE SURGERY N/A    Phreesia 02/16/2020   TONSILLECTOMY     TOTAL SHOULDER ARTHROPLASTY Left 03/04/2020   Procedure: TOTAL SHOULDER ARTHROPLASTY;  Surgeon: Marchia Bond, MD;  Location: [facility];  Service: Orthopedics;  Laterality: Left;   VASECTOMY  1982   Patient Active Problem List   Diagnosis Date Noted   S/P shoulder replacement, left  03/04/2020   Overweight (BMI 25.0-29.9) 03/28/2018   Abdominal distention 01/04/2018   Plantar fasciitis of right foot 11/17/2016   Chronic insomnia 10/18/2016   Male impotence 12/24/2014   DDD (degenerative disc disease), lumbar 12/24/2014   Peripheral neuropathy 12/24/2014   Glaucoma 11/07/2014   Hyperlipidemia, unspecified 11/07/2014   History of prostate cancer 11/07/2014   Hx of colonic polyps 02/19/2011    REFERRING DIAG:  Plantar fasciitis  THERAPY DIAG:  Pain in right foot  Stiffness of right ankle, not elsewhere classified  Muscle weakness (generalized)  PERTINENT HISTORY:  None  PRECAUTIONS:  None  SUBJECTIVE:  Patient reports he is doing well. States the right foot has been doing ok. He did a lot of walking yesterday, and one part up hill, so that caused some soreness.   PAIN:  Are you having pain? Yes:  NPRS scale: 2/10 (can worsen to 6/10 with walking) Pain location: Right foot, plantar aspect Pain description: Sharp Aggravating factors: Walking Relieving factors: Heel pad or shoe inserts, rolling foot on frozen bottle, heat   OBJECTIVE: (objective measures completed at initial evaluation unless otherwise dated)  PATIENT SURVEYS:  FOTO 53% functional status   MUSCLE LENGTH: Calf flexibility deficit   POSTURE:  Patient exhibits  bilateral bunions and pes planus   PALPATION: Tender to right calcaneal tubercle region, trigger points noted in right calf   LE ROM:   Active ROM Right 09/02/2021 Left 09/02/2021 Rt 09/28/2021  Ankle dorsiflexion '3 5 5  ' Ankle plantarflexion 55 55   Ankle inversion 35 35   Ankle eversion 15 15     LE MMT:   MMT Right 09/02/2021 Left 09/02/2021  Hip flexion 4 4  Hip extension 4 4  Hip abduction 4 4  Ankle dorsiflexion 5 5  Ankle plantarflexion 4 4-  Ankle inversion 4+ 4+  Ankle eversion 5 5    FUNCTIONAL TESTS:  SLS < 5 sec bilaterally 09/28/2021: 17 seconds on right   GAIT: Assistive device utilized:  None Level of assistance: Complete Independence Comments: Slightly antalgic on right, bilateral toe out with increased pronation     TODAY'S TREATMENT: [facility] Adult PT Treatment:                                                DATE: 10/05/2021 Therapeutic Exercise:  Recumbent bike L3 x 5 min while taking subjective Slant board calf stretch 3 x 30 sec Standing soleus stretch 3 x 20 sec each Standing DL heel raises on slant board 3 x 20 SLS 3 x 30 sec each Seated figure-4 inversion with blue 2 x 20 Eccentric heel raise 2 x 15 Rockerboard fwd/bwd taps 2 x 20 Forward heel tap 4" box 2 x 10 each   [facility] Adult PT Treatment:                                                DATE: 09/28/2021 Therapeutic Exercise:  Recumbent bike L3 x 4 min while taking subjective Slant board calf stretch 3 x 30 sec Standing soleus stretch 2 x 30 sec Standing heel raises with tennis ball between heels 3 x 20 Eccentric heel raise 2 x 15 SLS 3 x 30 sec each Seated figure-4 inversion with blue 2 x 20 Manual: Skilled palpation and monitoring of muscle tension while performing TPDN treatment STM and MFR for right calf Trigger Point Dry Needling Treatment: Pre-treatment instruction: Patient instructed on dry needling rationale, procedures, and possible side effects including pain during treatment (achy,cramping feeling), bruising, drop of blood, lightheadedness, nausea, sweating. Patient Consent Given: Yes Education handout provided: No Muscles treated: Right gastroc and soleus  Needle size and number: .30x62m x 6 Electrical stimulation performed: No Parameters: N/A Treatment response/outcome: Twitch response elicited, Palpable decrease in muscle tension, and patient report if improved symptoms Post-treatment instructions: Patient instructed to expect possible mild to moderate muscle soreness later today and/or tomorrow. Patient instructed in methods to reduce muscle soreness and to continue prescribed HEP. If  patient was dry needled over the lung field, patient was instructed on signs and symptoms of pneumothorax and, however unlikely, to see immediate medical attention should they occur. Patient was also educated on signs and symptoms of infection and to seek medical attention should they occur. Patient verbalized understanding of these instructions and education.  [facility]  [facility]: 09[facility]22/20[facility] Therapeutic Exercise:  Recumbent bike lvl 3 x 4 min while taking subjective Standing calf stretch on wedge 2x45" Standing soleus stretch 2x30"  Standing heel raises with tennis ball between ankles 2x15 Tandem stance on foam 2x30" Wobble board 2x10 fwd/bwd Heel lowering on 4in step 2x10 Long sitting ankle Inv/Ev 2x15 GTB R Longsitting ankle PF with black 2x15 BAPS L3 cw/ccw 2x20 R STS 2x10 - no UE support  Calf raises on leg press 2x10 35#   PATIENT EDUCATION:  Education details: HEP Person educated: Patient Education method: Consulting civil engineer, Demonstration, Corporate treasurer cues, Verbal cues Education comprehension: verbalized understanding, returned demonstration, verbal cues required, tactile cues required, and needs further education   HOME EXERCISE PROGRAM: Access Code: [email or web address]     ASSESSMENT: CLINICAL IMPRESSION: Patient tolerated therapy well with no adverse effects. Therapy focused primarily on progression of therex consisting of stretching and strengthening. He seems to be progressing well and reports continued improvement. He does continued to exhibit gross deficits with SLS stability and arch control. No changes to HEP this visit. Patient would benefit from continued skilled PT to progress mobility and strength in order to reduce pain and maximize functional ability.     OBJECTIVE IMPAIRMENTS Abnormal gait, decreased activity tolerance, decreased balance, decreased ROM, decreased strength, impaired flexibility, and pain.    ACTIVITY  LIMITATIONS community activity and shopping.    PERSONAL FACTORS Past/current experiences and Time since onset of injury/illness/exacerbation are also affecting patient's functional outcome.     GOALS:   SHORT TERM GOALS: Target date: 09/30/2021   Patient will be I with initial HEP in order to progress with therapy. Baseline: HEP provided at eval 09/28/2021: independent with initial HEP Goal status: MET   2.  PT will review FOTO with patient by 3rd visit in order to understand expected progress and outcome with therapy. Baseline: assessed at eval 09/28/2021: reviewed Goal status: MET   3.  Patient will demonstrate right ankle DF >/= 5 deg to improve gait and reduce pain with walking Baseline: 3 deg 09/28/2021: 5 deg Goal status: MET   LONG TERM GOALS: Target date: 10/28/2021   Patient will be I with final HEP to maintain progress from PT. Baseline: HEP provided at eval Goal status: INITIAL   2.  Patient will report >/= 66% status on FOTO to indicate improved functional ability. Baseline: 53% Goal status: INITIAL   3.  Patient will report no limitation with walking to improve community access and exercise. Baseline: patient reports limitation with walking due to pain Goal status: INITIAL   4.  Patient will demonstrate ankle DF >/= 8 deg to improve gait and reduce pain with walking Baseline: 3 deg Goal status: INITIAL   5.  Patient will demonstrate ankle strength 5/5 MMT to improve walking and standing tolerance. Baseline: patient demonstrates ankle strength deficits (see above) Goal status: INITIAL     PLAN: PT FREQUENCY: 1x/week   PT DURATION: 8 weeks   PLANNED INTERVENTIONS: Therapeutic exercises, Therapeutic activity, Neuromuscular re-education, Balance training, Gait training, Patient/Family education, Joint manipulation, Joint mobilization, Aquatic Therapy, Dry Needling, Spinal manipulation, Spinal mobilization, Cryotherapy, Moist heat, Taping, Ionotophoresis 53m/ml  Dexamethasone, and Manual therapy   PLAN FOR NEXT SESSION: Review HEP and progress PRN, manual/dry needling for foot and calf, calf stretching, initiate foot intrinsic and balance training, calf and ankle strengthening    CHilda Blades PT, DPT, LAT, ATC 10/05/21  10:03 AM Phone: [phone]: [phone]

## 2021-10-02 ENCOUNTER — Telehealth: Payer: Self-pay

## 2021-10-02 NOTE — Telephone Encounter (Signed)
Lmom to call back to schedule pick up of orthotics

## 2021-10-05 ENCOUNTER — Ambulatory Visit: Payer: Medicare Other | Admitting: Physical Therapy

## 2021-10-05 ENCOUNTER — Encounter: Payer: Self-pay | Admitting: Physical Therapy

## 2021-10-05 ENCOUNTER — Other Ambulatory Visit: Payer: Self-pay

## 2021-10-05 DIAGNOSIS — M25671 Stiffness of right ankle, not elsewhere classified: Secondary | ICD-10-CM | POA: Diagnosis not present

## 2021-10-05 DIAGNOSIS — M79671 Pain in right foot: Secondary | ICD-10-CM | POA: Diagnosis not present

## 2021-10-05 DIAGNOSIS — M6281 Muscle weakness (generalized): Secondary | ICD-10-CM | POA: Diagnosis not present

## 2021-10-06 ENCOUNTER — Ambulatory Visit: Payer: Medicare Other

## 2021-10-06 DIAGNOSIS — M722 Plantar fascial fibromatosis: Secondary | ICD-10-CM

## 2021-10-06 NOTE — Progress Notes (Signed)
SITUATION: Reason for Visit: Fitting and Delivery of Custom Fabricated Foot Orthoses Patient Report: Patient reports comfort and is satisfied with device.  OBJECTIVE DATA: Patient History / Diagnosis:     ICD-10-CM   1. Plantar fasciitis of right foot  M72.2     2. Plantar fasciitis, left  M72.2       Provided Device:  Custom Functional Foot Orthotics     [employer]: [id number]  GOAL OF ORTHOSIS - Improve gait - Decrease energy expenditure - Improve Balance - Provide Triplanar stability of foot complex - Facilitate motion  ACTIONS PERFORMED Patient was fit with foot orthotics trimmed to shoe last. Patient tolerated fittign procedure.   Patient was provided with verbal and written instruction and demonstration regarding donning, doffing, wear, care, proper fit, function, purpose, cleaning, and use of the orthosis and in all related precautions and risks and benefits regarding the orthosis.  Patient was also provided with verbal instruction regarding how to report any failures or malfunctions of the orthosis and necessary follow up care. Patient was also instructed to contact our office regarding any change in status that may affect the function of the orthosis.  Patient demonstrated independence with proper donning, doffing, and fit and verbalized understanding of all instructions.  PLAN: Patient is to follow up in one week or as necessary (PRN). All questions were answered and concerns addressed. Plan of care was discussed with and agreed upon by the patient.

## 2021-10-14 ENCOUNTER — Ambulatory Visit: Payer: Medicare Other

## 2021-10-14 DIAGNOSIS — M79671 Pain in right foot: Secondary | ICD-10-CM

## 2021-10-14 DIAGNOSIS — M25671 Stiffness of right ankle, not elsewhere classified: Secondary | ICD-10-CM | POA: Diagnosis not present

## 2021-10-14 DIAGNOSIS — M6281 Muscle weakness (generalized): Secondary | ICD-10-CM | POA: Diagnosis not present

## 2021-10-14 NOTE — Therapy (Addendum)
OUTPATIENT PHYSICAL THERAPY TREATMENT NOTE  DISCHARGE   Patient Name: Kyle Wilcox MRN: [id number] DOB:April 28, 1951, 71 y.o., male Today's Date: 10/14/2021  PCP: Susy Frizzle, MD REFERRING PROVIDER: Susy Frizzle, MD  END OF SESSION:   PT End of Session - 10/14/21 1123     Visit Number 6    Number of Visits 8    Date for PT Re-Evaluation 10/28/21    Authorization Type MCR    Progress Note Due on Visit 10    PT Start Time 1130    PT Stop Time 1210    PT Time Calculation (min) 40 min    Activity Tolerance Patient tolerated treatment well    Behavior During Therapy [facility] for tasks assessed/performed                  Past Medical History:  Diagnosis Date   Allergy 2014   Arthritis    Phreesia 02/16/2020   Cancer ([place]) 2015   prostate cancer   Cataract 2016   COVID-19 05/2019   COVID-19 05/2019   DDD (degenerative disc disease), lumbar 12/24/2014   GERD (gastroesophageal reflux disease)    Glaucoma    Hx of colonic polyps 02/19/2011   Hyperlipidemia 11/07/2014   Peripheral neuropathy 12/24/2014   Past Surgical History:  Procedure Laterality Date   COLONOSCOPY     EYE SURGERY     laser surgery to relieve pressure on eyes- both eyes   JOINT REPLACEMENT  2011   total shoulder replacement (Right)   LAPAROTOMY N/A 01/06/2018   Procedure: EXPLORATORY LAPAROTOMY;  Surgeon: Judeth Horn, MD;  Location: [place];  Service: General;  Laterality: N/A;   LYSIS OF ADHESION N/A 01/06/2018   Procedure: LYSIS OF ADHESION;  Surgeon: Judeth Horn, MD;  Location: [place];  Service: General;  Laterality: N/A;   PROSTATE SURGERY     2015   SMALL INTESTINE SURGERY N/A    Phreesia 02/16/2020   TONSILLECTOMY     TOTAL SHOULDER ARTHROPLASTY Left 03/04/2020   Procedure: TOTAL SHOULDER ARTHROPLASTY;  Surgeon: Marchia Bond, MD;  Location[facility];  Service: Orthopedics;  Laterality: Left;   VASECTOMY  1982   Patient Active Problem List   Diagnosis Date Noted   S/P shoulder  replacement, left 03/04/2020   Overweight (BMI 25.0-29.9) 03/28/2018   Abdominal distention 01/04/2018   Plantar fasciitis of right foot 11/17/2016   Chronic insomnia 10/18/2016   Male impotence 12/24/2014   DDD (degenerative disc disease), lumbar 12/24/2014   Peripheral neuropathy 12/24/2014   Glaucoma 11/07/2014   Hyperlipidemia, unspecified 11/07/2014   History of prostate cancer 11/07/2014   Hx of colonic polyps 02/19/2011    REFERRING DIAG:  Plantar fasciitis  THERAPY DIAG:  Pain in right foot  Stiffness of right ankle, not elsewhere classified  Muscle weakness (generalized)  PERTINENT HISTORY:  None  PRECAUTIONS:  None  SUBJECTIVE:  Pt presents to PT with no current reports of pain or discomfort. Has been compliant with HEP with no adverse effect. Pt is ready to begin PT at this time.   PAIN:  Are you having pain? Yes:  NPRS scale: 2/10 (can worsen to 6/10 with walking) Pain location: Right foot, plantar aspect Pain description: Sharp Aggravating factors: Walking Relieving factors: Heel pad or shoe inserts, rolling foot on frozen bottle, heat   OBJECTIVE: (objective measures completed at initial evaluation unless otherwise dated)  PATIENT SURVEYS:  FOTO 53% functional status   MUSCLE LENGTH: Calf flexibility deficit   POSTURE:  Patient exhibits bilateral bunions and pes planus   PALPATION: Tender to right calcaneal tubercle region, trigger points noted in right calf   LE ROM:   Active ROM Right 09/02/2021 Left 09/02/2021 Rt 09/28/2021  Ankle dorsiflexion _0 Ankle plantarflexion 55 55   Ankle inversion 35 35   Ankle eversion 15 15     LE MMT:   MMT Right 09/02/2021 Left 09/02/2021  Hip flexion 4 4  Hip extension 4 4  Hip abduction 4 4  Ankle dorsiflexion 5 5  Ankle plantarflexion 4 4-  Ankle inversion 4+ 4+  Ankle eversion 5 5    FUNCTIONAL TESTS:  SLS < 5 sec bilaterally 09/28/2021: 17 seconds on right   GAIT: Assistive device  utilized: None Level of assistance: Complete Independence Comments: Slightly antalgic on right, bilateral toe out with increased pronation     TODAY'S TREATMENT: [facility] Adult PT Treatment:                                                DATE: 10/14/2021 Therapeutic Exercise:  Recumbent bike L3 x 5 min while taking subjective Slant board calf stretch x 60" Slant board soleus stretch x 60"  SLS on foam 2x30 sec each SLS on foam with KB cross x 10 5# Eccentric heel raise 2x15 6in step Standing lunge x 10 each Heel raise with tennis ball 2x10  Rockerboard fwd/bwd/lat taps 2x15 each Forward heel tap 4" box 2x10 each  [facility] Adult PT Treatment:                                                DATE: 10/05/2021 Therapeutic Exercise:  Recumbent bike L3 x 5 min while taking subjective Slant board calf stretch 3 x 30 sec Standing soleus stretch 3 x 20 sec each Standing DL heel raises on slant board 3 x 20 SLS 3 x 30 sec each Seated figure-4 inversion with blue 2 x 20 Eccentric heel raise 2 x 15 Rockerboard fwd/bwd taps 2 x 20 Forward heel tap 4" box 2 x 10 each   [facility] Adult PT Treatment:                                                DATE: 09/28/2021 Therapeutic Exercise:  Recumbent bike L3 x 4 min while taking subjective Slant board calf stretch 3 x 30 sec Standing soleus stretch 2 x 30 sec Standing heel raises with tennis ball between heels 3 x 20 Eccentric heel raise 2 x 15 SLS 3 x 30 sec each Seated figure-4 inversion with blue 2 x 20 Manual: Skilled palpation and monitoring of muscle tension while performing TPDN treatment STM and MFR for right calf Trigger Point Dry Needling Treatment: Pre-treatment instruction: Patient instructed on dry needling rationale, procedures, and possible side effects including pain during treatment (achy,cramping feeling), bruising, drop of blood, lightheadedness, nausea, sweating. Patient Consent Given: Yes Education handout provided: No Muscles treated:  Right gastroc and soleus  Needle size and number: .30x57m x 6 Electrical stimulation performed: No Parameters: N/A Treatment response/outcome: Twitch response elicited, Palpable  decrease in muscle tension, and patient report if improved symptoms Post-treatment instructions: Patient instructed to expect possible mild to moderate muscle soreness later today and/or tomorrow. Patient instructed in methods to reduce muscle soreness and to continue prescribed HEP. If patient was dry needled over the lung field, patient was instructed on signs and symptoms of pneumothorax and, however unlikely, to see immediate medical attention should they occur. Patient was also educated on signs and symptoms of infection and to seek medical attention should they occur. Patient verbalized understanding of these instructions and education.  PATIENT EDUCATION:  Education details: HEP Person educated: Patient Education method: Education officer, environmental, Corporate treasurer cues, Verbal cues Education comprehension: verbalized understanding, returned demonstration, verbal cues required, tactile cues required, and needs further education   HOME EXERCISE PROGRAM: Access Code: [email or web address] URL: [email or web address] Date: 10/14/2021 Prepared by: Octavio Manns  Exercises - Standing Gastroc Stretch at Counter  - 2-3 x daily - 3 reps - 30 seconds hold - Seated Figure 4 Ankle Inversion with Resistance  - 1 x daily - 3 sets - 10 reps - Long Sitting Ankle Plantar Flexion with Resistance  - 1 x daily - 3 sets - 10 reps - Ankle Dorsiflexion with Resistance  - 1 x daily - 2 sets - 15 reps - Ankle Eversion with Resistance  - 1 x daily - 2 sets - 15 reps - Standing Calf Raise With Small Ball at Heels  - 1 x daily - 3 sets - 20 reps - Calf Mobilization with Small Ball  - Standard Lunge  - 1 x daily - 7 x weekly - 2 sets - 10 reps - Eccentric Heel Lowering on Step  - 1 x daily - 7 x weekly - 2 sets - 15 reps - Standing Ankle  Plantar Flexion Dorsiflexion on Rocker Board with Counter Support  - 1 x daily - 7 x weekly - 2 sets - 15 reps - Forward Step Down Touch with Heel  - 1 x daily - 7 x weekly - 2 sets - 10 reps     ASSESSMENT: CLINICAL IMPRESSION: Pt was once again able to complete all prescribed exercises with no adverse effect or increase in pain. Therapy today focused on improving LE strength and ankle stability in order to decrease and improve mobility. He continues to progress very well with therapy, showing improved strength and noting decreased pain. PT will continue to progress exercises as able per POC as prescribed.      OBJECTIVE IMPAIRMENTS Abnormal gait, decreased activity tolerance, decreased balance, decreased ROM, decreased strength, impaired flexibility, and pain.    ACTIVITY LIMITATIONS community activity and shopping.    PERSONAL FACTORS Past/current experiences and Time since onset of injury/illness/exacerbation are also affecting patient's functional outcome.     GOALS:   SHORT TERM GOALS: Target date: 09/30/2021   Patient will be I with initial HEP in order to progress with therapy. Baseline: HEP provided at eval 09/28/2021: independent with initial HEP Goal status: MET   2.  PT will review FOTO with patient by 3rd visit in order to understand expected progress and outcome with therapy. Baseline: assessed at eval 09/28/2021: reviewed Goal status: MET   3.  Patient will demonstrate right ankle DF >/= 5 deg to improve gait and reduce pain with walking Baseline: 3 deg 09/28/2021: 5 deg Goal status: MET   LONG TERM GOALS: Target date: 10/28/2021   Patient will be I with final HEP to maintain progress from PT. Baseline: HEP provided at eval Goal  status: INITIAL   2.  Patient will report >/= 66% status on FOTO to indicate improved functional ability. Baseline: 53% Goal status: INITIAL   3.  Patient will report no limitation with walking to improve community access and  exercise. Baseline: patient reports limitation with walking due to pain Goal status: INITIAL   4.  Patient will demonstrate ankle DF >/= 8 deg to improve gait and reduce pain with walking Baseline: 3 deg Goal status: INITIAL   5.  Patient will demonstrate ankle strength 5/5 MMT to improve walking and standing tolerance. Baseline: patient demonstrates ankle strength deficits (see above) Goal status: INITIAL     PLAN: PT FREQUENCY: 1x/week   PT DURATION: 8 weeks   PLANNED INTERVENTIONS: Therapeutic exercises, Therapeutic activity, Neuromuscular re-education, Balance training, Gait training, Patient/Family education, Joint manipulation, Joint mobilization, Aquatic Therapy, Dry Needling, Spinal manipulation, Spinal mobilization, Cryotherapy, Moist heat, Taping, Ionotophoresis 61m/ml Dexamethasone, and Manual therapy   PLAN FOR NEXT SESSION: Review HEP and progress PRN, manual/dry needling for foot and calf, calf stretching, initiate foot intrinsic and balance training, calf and ankle strengthening    DWard ChattersPT 10/14/21 12:12 PM      PHYSICAL THERAPY DISCHARGE SUMMARY  Visits from Start of Care: 6  Current functional level related to goals / functional outcomes: See above   Remaining deficits: See above   Education / Equipment: HEP   Patient agrees to discharge. Patient goals were partially met. Patient is being discharged due to not returning since the last visit.  CHilda Blades PT, DPT, LAT, ATC 11/03/21  10:14 AM Phone: [phone]: [phone]

## 2021-10-15 ENCOUNTER — Encounter (HOSPITAL_COMMUNITY): Payer: Self-pay

## 2021-10-15 ENCOUNTER — Ambulatory Visit (INDEPENDENT_AMBULATORY_CARE_PROVIDER_SITE_OTHER): Payer: Medicare Other | Admitting: Podiatry

## 2021-10-15 ENCOUNTER — Ambulatory Visit (HOSPITAL_COMMUNITY)
Admission: EM | Admit: 2021-10-15 | Discharge: 2021-10-15 | Disposition: A | Discharge status: Home / Self Care | Payer: Medicare Other | Attending: Physician Assistant | Admitting: Physician Assistant

## 2021-10-15 DIAGNOSIS — L603 Nail dystrophy: Secondary | ICD-10-CM

## 2021-10-15 DIAGNOSIS — J0110 Acute frontal sinusitis, unspecified: Secondary | ICD-10-CM | POA: Diagnosis not present

## 2021-10-15 DIAGNOSIS — J069 Acute upper respiratory infection, unspecified: Secondary | ICD-10-CM | POA: Diagnosis not present

## 2021-10-15 DIAGNOSIS — R112 Nausea with vomiting, unspecified: Secondary | ICD-10-CM

## 2021-10-15 MED ORDER — TRIAMCINOLONE ACETONIDE 40 MG/ML IJ SUSP
40.0000 mg | Freq: Once | INTRAMUSCULAR | Status: AC
  Administered 2021-10-15: 40 mg via INTRAMUSCULAR

## 2021-10-15 MED ORDER — ONDANSETRON HCL 4 MG PO TABS: 4.0000 mg | ORAL_TABLET | Freq: Three times a day (TID) | ORAL | 0 refills | Status: AC | PRN

## 2021-10-15 MED ORDER — ONDANSETRON 4 MG PO TBDP
4.0000 mg | ORAL_TABLET | Freq: Once | ORAL | Status: AC
  Administered 2021-10-15: 4 mg via ORAL

## 2021-10-15 MED ORDER — PREDNISONE 10 MG PO TABS: 30.0000 mg | ORAL_TABLET | Freq: Three times a day (TID) | ORAL | 0 refills | Status: DC

## 2021-10-15 MED ORDER — FLUTICASONE PROPIONATE 50 MCG/ACT NA SUSP: 2.0000 | Freq: Every day | NASAL | 2 refills | Status: AC

## 2021-10-15 MED ORDER — ONDANSETRON 4 MG PO TBDP
ORAL_TABLET | ORAL | Status: AC
  Filled 2021-10-15: qty 1

## 2021-10-15 MED ORDER — TERBINAFINE HCL 250 MG PO TABS: 250.0000 mg | ORAL_TABLET | Freq: Every day | ORAL | 0 refills | Status: DC

## 2021-10-15 MED ORDER — TRIAMCINOLONE ACETONIDE 40 MG/ML IJ SUSP
INTRAMUSCULAR | Status: AC
  Filled 2021-10-15: qty 1

## 2021-10-15 NOTE — Discharge Instructions (Signed)
Advised use of Flonase nasal spray 2 sprays each nostril daily for the next 10 days. Advised to take the prednisone 10 mg 1 3 times a day for the next 5 days. Advised to take Mucinex twice daily for the next several days for the congestion. Advised to follow-up with PCP or return if symptoms fail to improve.

## 2021-10-15 NOTE — ED Provider Notes (Signed)
[name]    CSN: [id number] Arrival date & time: 10/15/21  1524      History   Chief Complaint Chief Complaint  Patient presents with   URI   Headache   Emesis    HPI Kyle Wilcox is a 71 y.o. male.   71 year old male presents with sinus congestion, nausea and vomiting.  Patient relates for the past several days he has been having some increasing frontal sinus pain and pressure, with nasal congestion and clear production patient relates that he has had a lot of upper respiratory congestion for the past couple days that has not been relieved with over-the-counter sinus medication.  Patient relates she does have some bilateral ear congestion with left being worse.  Patient relates he is not having any dizziness or off-balance sensation.  He has not have any fever or chills.  Patient does relate that he has for the past couple days been nauseated and has thrown up several times.  Patient relates that he is not having any diarrhea.  Patient relates he has not had any chest congestion, no shortness of breath, no wheezing.  Patient relates he has not been around any friends or family that have been sick and he has not traveled recently. Patient did throw up repeatedly here in the clinic when he is waiting to be seen by the provider.   URI Presenting symptoms: rhinorrhea   Associated symptoms: headaches   Headache Associated symptoms: drainage, sinus pressure, URI and vomiting   Emesis Associated symptoms: headaches and URI    Past Medical History:  Diagnosis Date   Allergy 2014   Arthritis    [employer] 02/16/2020   Cancer ([place]) 2015   prostate cancer   Cataract 2016   COVID-19 05/2019   COVID-19 05/2019   DDD (degenerative disc disease), lumbar 12/24/2014   GERD (gastroesophageal reflux disease)    Glaucoma    Hx of colonic polyps 02/19/2011   Hyperlipidemia 11/07/2014   Peripheral neuropathy 12/24/2014    Patient Active Problem List   Diagnosis Date Noted   S/P  shoulder replacement, left 03/04/2020   Overweight (BMI 25.0-29.9) 03/28/2018   Abdominal distention 01/04/2018   Plantar fasciitis of right foot 11/17/2016   Chronic insomnia 10/18/2016   Male impotence 12/24/2014   DDD (degenerative disc disease), lumbar 12/24/2014   Peripheral neuropathy 12/24/2014   Glaucoma 11/07/2014   Hyperlipidemia, unspecified 11/07/2014   History of prostate cancer 11/07/2014   Hx of colonic polyps 02/19/2011    Past Surgical History:  Procedure Laterality Date   COLONOSCOPY     EYE SURGERY     laser surgery to relieve pressure on eyes- both eyes   JOINT REPLACEMENT  2011   total shoulder replacement (Right)   LAPAROTOMY N/A 01/06/2018   Procedure: EXPLORATORY LAPAROTOMY;  Surgeon: Judeth Horn, MD;  Location: [facility];  Service: General;  Laterality: N/A;   LYSIS OF ADHESION N/A 01/06/2018   Procedure: LYSIS OF ADHESION;  Surgeon: Judeth Horn, MD;  Location: [facility];  Service: General;  Laterality: N/A;   PROSTATE SURGERY     2015   SMALL INTESTINE SURGERY N/A    [employer] 02/16/2020   TONSILLECTOMY     TOTAL SHOULDER ARTHROPLASTY Left 03/04/2020   Procedure: TOTAL SHOULDER ARTHROPLASTY;  Surgeon: Marchia Bond, MD;  Location: [facility];  Service: Orthopedics;  Laterality: Left;   VASECTOMY  1982       Home Medications    Prior to Admission medications  Medication Sig Start Date End Date Taking? Authorizing Provider  fluticasone (FLONASE) 50 MCG/ACT nasal spray Place 2 sprays into both nostrils daily. 10/15/21  Yes Nyoka Lint, PA-C  ondansetron (ZOFRAN) 4 MG tablet Take 1 tablet (4 mg total) by mouth every 8 (eight) hours as needed for nausea or vomiting. 10/15/21  Yes Nyoka Lint, PA-C  predniSONE (DELTASONE) 10 MG tablet Take 3 tablets (30 mg total) by mouth with breakfast, with lunch, and with evening meal. 10/15/21  Yes Nyoka Lint, PA-C  aspirin 81 MG EC tablet Take 81 mg by mouth daily. Swallow whole.    [provider]  atorvastatin  (LIPITOR) 40 MG tablet TAKE 1 TABLET DAILY AT 6PM 06/09/21   Susy Frizzle, MD  diphenhydrAMINE (BENADRYL) 25 MG tablet Take 25 mg by mouth See admin instructions. Take 25 mg daily, may take a second 25 mg dose as needed for allergies    [provider]  dorzolamide-timolol (COSOPT) 22.3-6.8 MG/ML ophthalmic solution Place 1 drop into both eyes 2 (two) times daily.  09/19/18   [provider]  FIBER PO Take 1 capsule by mouth 2 (two) times daily.    [provider]  gabapentin (NEURONTIN) 600 MG tablet Take 1 tablet (600 mg total) by mouth 2 (two) times daily. 12/08/20   Susy Frizzle, MD  Latanoprostene Bunod 0.024 % SOLN Place 1 drop into both eyes at bedtime.     [provider]  meloxicam (MOBIC) 15 MG tablet Take 1 tablet (15 mg total) by mouth daily. 04/28/21   Susy Frizzle, MD  metroNIDAZOLE (METROGEL) 0.75 % gel Apply 1 application. topically 2 (two) times daily. 09/28/21   Susy Frizzle, MD  Omega-3 Fatty Acids (OMEGA-3 FISH OIL) 300 MG CAPS Take 300 mg by mouth daily.    [provider]  ONE DAILY MULTIPLE VITAMIN PO Take 1 tablet by mouth daily.     [provider]  terbinafine (LAMISIL) 250 MG tablet Take 1 tablet (250 mg total) by mouth daily. 10/15/21   Hyatt, Max T, DPM  traZODone (DESYREL) 50 MG tablet TAKE 1/2 TO 1 (ONE-HALF TO ONE) TABLET BY MOUTH AT BEDTIME AS NEEDED FOR SLEEP 12/08/20   Susy Frizzle, MD  vitamin C (ASCORBIC ACID) 500 MG tablet Take 500 mg by mouth daily.    [provider]    Family History Family History  Problem Relation Age of Onset   Arthritis Mother    Heart disease Mother    Hyperlipidemia Mother    Hypertension Mother    Stroke Mother    Vision loss Mother    Arthritis Father    Depression Father    Heart disease Father    Hyperlipidemia Father    Stroke Father    Arthritis Sister    Cancer Sister    Diabetes Sister    Hyperlipidemia Sister    Arthritis Brother     Diabetes Brother    Heart disease Brother    Hyperlipidemia Brother    Hypertension Brother    Stomach cancer Maternal Grandfather    Colon cancer Neg Hx    Esophageal cancer Neg Hx    Rectal cancer Neg Hx     Social History Social History   Tobacco Use   Smoking status: Former    Types: Cigarettes    Quit date: 03/09/1981    Years since quitting: 40.6   Smokeless tobacco: Never  Vaping Use   Vaping Use: Never used  Substance Use Topics   Alcohol use: Yes    Alcohol/week: 2.0 standard drinks    Types: 2 Cans of beer per week    Comment: occasionally   Drug use: No     Allergies   Patient has no known allergies.   Review of Systems Review of Systems  HENT:  Positive for postnasal drip, rhinorrhea and sinus pressure.   Gastrointestinal:  Positive for vomiting.  Neurological:  Positive for headaches.    Physical Exam Triage Vital Signs ED Triage Vitals  Enc Vitals Group     BP 10/15/21 1642 (!) 155/92     Pulse Rate 10/15/21 1642 71     Resp 10/15/21 1642 17     Temp 10/15/21 1642 97.9 F (36.6 C)     Temp Source 10/15/21 1642 Oral     SpO2 10/15/21 1642 95 %     Weight --      Height --      Head Circumference --      Peak Flow --      Pain Score 10/15/21 1641 6     Pain Loc --      Pain Edu? --      Excl. in [place]? --    No data found.  Updated Vital Signs BP (!) 155/92 (BP Location: Right Arm)   Pulse 71   Temp 97.9 F (36.6 C) (Oral)   Resp 17   SpO2 95%   Visual Acuity Right Eye Distance:   Left Eye Distance:   Bilateral Distance:    Right Eye Near:   Left Eye Near:    Bilateral Near:     Physical Exam Constitutional:      Appearance: He is well-developed.  HENT:     Right Ear: Ear canal normal. Tympanic membrane is injected.     Left Ear: Ear canal normal. Tympanic membrane is injected.     Mouth/Throat:     Mouth: Mucous membranes are moist.     Pharynx: Oropharynx is clear. No posterior oropharyngeal erythema.  Cardiovascular:      Rate and Rhythm: Normal rate and regular rhythm.     Heart sounds: Normal heart sounds.  Pulmonary:     Effort: Pulmonary effort is normal.     Breath sounds: Normal breath sounds and air entry. No wheezing, rhonchi or rales.  Abdominal:     General: Abdomen is flat. Bowel sounds are normal.     Palpations: Abdomen is soft.     Tenderness: There is no abdominal tenderness. There is no guarding or rebound.  Neurological:     Mental Status: He is alert.     UC Treatments / Results  Labs (all labs ordered are listed, but only abnormal results are displayed) Labs Reviewed - No data to display  EKG   Radiology No results found.  Procedures Procedures (including critical care time)  Medications Ordered in UC Medications  ondansetron (ZOFRAN-ODT) disintegrating tablet 4 mg (has no administration in time range)  triamcinolone acetonide (KENALOG-40) injection 40 mg (has no administration in time range)    Initial Impression / Assessment and Plan / UC Course  I have reviewed the triage vital signs and the nursing notes.  Pertinent labs & imaging results that were available during my care of the patient were reviewed by me and considered in my medical decision making (see chart for details).    Plan: 1.  Advised patient to use of Flonase nasal spray 2 sprays each nostril once  a day to help decrease sinus congestion. 2.  Advised the patient to use the Zofran to help decrease the nausea and vomiting. 3.  Advised patient to take the prednisone 10 mg 3 times a day for the next 5 days to help decrease the sinus congestion or pressure. 4.  Advised patient to continue taking the over-the-counter sinus congestion medicine to help reduce his symptoms. 5.  Advised patient to follow-up with his PCP or return to urgent care if symptoms fail to improve. Final Clinical Impressions(s) / UC Diagnoses   Final diagnoses:  Viral upper respiratory tract infection  Nausea and vomiting,  unspecified vomiting type  Acute non-recurrent frontal sinusitis     Discharge Instructions      Advised use of Flonase nasal spray 2 sprays each nostril daily for the next 10 days. Advised to take the prednisone 10 mg 1 3 times a day for the next 5 days. Advised to take Mucinex twice daily for the next several days for the congestion. Advised to follow-up with PCP or return if symptoms fail to improve.    ED Prescriptions     Medication Sig Dispense Auth. Provider   fluticasone (FLONASE) 50 MCG/ACT nasal spray Place 2 sprays into both nostrils daily. 9.9 mL Nyoka Lint, PA-C   predniSONE (DELTASONE) 10 MG tablet Take 3 tablets (30 mg total) by mouth with breakfast, with lunch, and with evening meal. 15 tablet Nyoka Lint, PA-C   ondansetron (ZOFRAN) 4 MG tablet Take 1 tablet (4 mg total) by mouth every 8 (eight) hours as needed for nausea or vomiting. 20 tablet Nyoka Lint, PA-C      PDMP not reviewed this encounter.   Nyoka Lint, PA-C 10/15/21 1706

## 2021-10-15 NOTE — ED Triage Notes (Signed)
Pt presents with cough, sinus headache, and vomiting X 2 days.

## 2021-10-17 ENCOUNTER — Inpatient Hospital Stay (HOSPITAL_COMMUNITY)
Admission: EM | Admit: 2021-10-17 | Discharge: 2021-10-23 | DRG: 615 | Disposition: A | Discharge status: Home / Self Care | Payer: Medicare Other | Attending: Neurosurgery | Admitting: Neurosurgery

## 2021-10-17 ENCOUNTER — Emergency Department (HOSPITAL_COMMUNITY): Payer: Medicare Other

## 2021-10-17 ENCOUNTER — Other Ambulatory Visit: Payer: Self-pay

## 2021-10-17 ENCOUNTER — Encounter (HOSPITAL_COMMUNITY): Payer: Self-pay

## 2021-10-17 ENCOUNTER — Encounter (HOSPITAL_COMMUNITY): Payer: Self-pay | Admitting: Emergency Medicine

## 2021-10-17 ENCOUNTER — Ambulatory Visit (HOSPITAL_COMMUNITY)
Admission: EM | Admit: 2021-10-17 | Discharge: 2021-10-17 | Disposition: A | Discharge status: Home / Self Care | Payer: Medicare Other | Attending: Emergency Medicine | Admitting: Emergency Medicine

## 2021-10-17 DIAGNOSIS — R42 Dizziness and giddiness: Secondary | ICD-10-CM | POA: Diagnosis not present

## 2021-10-17 DIAGNOSIS — R9431 Abnormal electrocardiogram [ECG] [EKG]: Secondary | ICD-10-CM | POA: Diagnosis not present

## 2021-10-17 DIAGNOSIS — Z823 Family history of stroke: Secondary | ICD-10-CM | POA: Diagnosis not present

## 2021-10-17 DIAGNOSIS — M1990 Unspecified osteoarthritis, unspecified site: Secondary | ICD-10-CM | POA: Diagnosis present

## 2021-10-17 DIAGNOSIS — E236 Other disorders of pituitary gland: Secondary | ICD-10-CM | POA: Diagnosis not present

## 2021-10-17 DIAGNOSIS — J342 Deviated nasal septum: Secondary | ICD-10-CM | POA: Diagnosis present

## 2021-10-17 DIAGNOSIS — Z8546 Personal history of malignant neoplasm of prostate: Secondary | ICD-10-CM | POA: Diagnosis not present

## 2021-10-17 DIAGNOSIS — R519 Headache, unspecified: Secondary | ICD-10-CM

## 2021-10-17 DIAGNOSIS — Z800 Family history of malignant neoplasm of digestive organs: Secondary | ICD-10-CM

## 2021-10-17 DIAGNOSIS — M5136 Other intervertebral disc degeneration, lumbar region: Secondary | ICD-10-CM | POA: Diagnosis present

## 2021-10-17 DIAGNOSIS — Z821 Family history of blindness and visual loss: Secondary | ICD-10-CM

## 2021-10-17 DIAGNOSIS — Z8261 Family history of arthritis: Secondary | ICD-10-CM

## 2021-10-17 DIAGNOSIS — E230 Hypopituitarism: Secondary | ICD-10-CM | POA: Diagnosis not present

## 2021-10-17 DIAGNOSIS — H538 Other visual disturbances: Principal | ICD-10-CM

## 2021-10-17 DIAGNOSIS — Z833 Family history of diabetes mellitus: Secondary | ICD-10-CM | POA: Diagnosis not present

## 2021-10-17 DIAGNOSIS — H532 Diplopia: Secondary | ICD-10-CM | POA: Diagnosis not present

## 2021-10-17 DIAGNOSIS — D352 Benign neoplasm of pituitary gland: Principal | ICD-10-CM

## 2021-10-17 DIAGNOSIS — G629 Polyneuropathy, unspecified: Secondary | ICD-10-CM | POA: Diagnosis present

## 2021-10-17 DIAGNOSIS — Z791 Long term (current) use of non-steroidal anti-inflammatories (NSAID): Secondary | ICD-10-CM | POA: Diagnosis not present

## 2021-10-17 DIAGNOSIS — D3A8 Other benign neuroendocrine tumors: Secondary | ICD-10-CM | POA: Diagnosis not present

## 2021-10-17 DIAGNOSIS — Z7982 Long term (current) use of aspirin: Secondary | ICD-10-CM

## 2021-10-17 DIAGNOSIS — H539 Unspecified visual disturbance: Secondary | ICD-10-CM

## 2021-10-17 DIAGNOSIS — Z83438 Family history of other disorder of lipoprotein metabolism and other lipidemia: Secondary | ICD-10-CM | POA: Diagnosis not present

## 2021-10-17 DIAGNOSIS — Z96612 Presence of left artificial shoulder joint: Secondary | ICD-10-CM | POA: Diagnosis present

## 2021-10-17 DIAGNOSIS — H4902 Third [oculomotor] nerve palsy, left eye: Secondary | ICD-10-CM | POA: Diagnosis present

## 2021-10-17 DIAGNOSIS — Z818 Family history of other mental and behavioral disorders: Secondary | ICD-10-CM | POA: Diagnosis not present

## 2021-10-17 DIAGNOSIS — Z8249 Family history of ischemic heart disease and other diseases of the circulatory system: Secondary | ICD-10-CM | POA: Diagnosis not present

## 2021-10-17 DIAGNOSIS — Z01818 Encounter for other preprocedural examination: Secondary | ICD-10-CM | POA: Diagnosis not present

## 2021-10-17 DIAGNOSIS — Z96611 Presence of right artificial shoulder joint: Secondary | ICD-10-CM | POA: Diagnosis present

## 2021-10-17 DIAGNOSIS — K219 Gastro-esophageal reflux disease without esophagitis: Secondary | ICD-10-CM | POA: Diagnosis present

## 2021-10-17 DIAGNOSIS — Z8616 Personal history of COVID-19: Secondary | ICD-10-CM | POA: Diagnosis not present

## 2021-10-17 DIAGNOSIS — H409 Unspecified glaucoma: Secondary | ICD-10-CM | POA: Diagnosis present

## 2021-10-17 DIAGNOSIS — E7800 Pure hypercholesterolemia, unspecified: Secondary | ICD-10-CM | POA: Diagnosis present

## 2021-10-17 DIAGNOSIS — D497 Neoplasm of unspecified behavior of endocrine glands and other parts of nervous system: Secondary | ICD-10-CM

## 2021-10-17 DIAGNOSIS — Z87891 Personal history of nicotine dependence: Secondary | ICD-10-CM

## 2021-10-17 DIAGNOSIS — Z79899 Other long term (current) drug therapy: Secondary | ICD-10-CM | POA: Diagnosis not present

## 2021-10-17 DIAGNOSIS — H4900 Third [oculomotor] nerve palsy, unspecified eye: Secondary | ICD-10-CM | POA: Diagnosis not present

## 2021-10-17 LAB — COMPREHENSIVE METABOLIC PANEL
ALT: 17 U/L (ref 0–44)
AST: 33 U/L (ref 15–41)
Albumin: 4 g/dL (ref 3.5–5.0)
Alkaline Phosphatase: 81 U/L (ref 38–126)
Anion gap: 11 (ref 5–15)
BUN: 10 mg/dL (ref 8–23)
CO2: 26 mmol/L (ref 22–32)
Calcium: 9.5 mg/dL (ref 8.9–10.3)
Chloride: 94 mmol/L — ABNORMAL LOW (ref 98–111)
Creatinine, Ser: 0.91 mg/dL (ref 0.61–1.24)
GFR, Estimated: >60 mL/min (ref >60)
Glucose, Bld: 191 mg/dL — ABNORMAL HIGH (ref 70–99)
Potassium: 4.1 mmol/L (ref 3.5–5.1)
Sodium: 131 mmol/L — ABNORMAL LOW (ref 135–145)
Total Bilirubin: 1.1 mg/dL (ref 0.3–1.2)
Total Protein: 6.8 g/dL (ref 6.5–8.1)

## 2021-10-17 LAB — CBC WITH DIFFERENTIAL/PLATELET
Abs Immature Granulocytes: 0.08 10*3/uL — ABNORMAL HIGH (ref 0.00–0.07)
Basophils Absolute: 0 10*3/uL (ref 0.0–0.1)
Basophils Relative: 0 %
Eosinophils Absolute: 0 10*3/uL (ref 0.0–0.5)
Eosinophils Relative: 0 %
HCT: 46.7 % (ref 39.0–52.0)
Hemoglobin: 16 g/dL (ref 13.0–17.0)
Immature Granulocytes: 0 %
Lymphocytes Relative: 11 %
Lymphs Abs: 1.9 10*3/uL (ref 0.7–4.0)
MCH: 31.4 pg (ref 26.0–34.0)
MCHC: 34.3 g/dL (ref 30.0–36.0)
MCV: 91.6 fL (ref 80.0–100.0)
Monocytes Absolute: 2.5 10*3/uL — ABNORMAL HIGH (ref 0.1–1.0)
Monocytes Relative: 14 %
Neutro Abs: 13.3 10*3/uL — ABNORMAL HIGH (ref 1.7–7.7)
Neutrophils Relative %: 75 %
Platelets: 232 10*3/uL (ref 150–400)
RBC: 5.1 MIL/uL (ref 4.22–5.81)
RDW: 13.4 % (ref 11.5–15.5)
WBC: 17.8 10*3/uL — ABNORMAL HIGH (ref 4.0–10.5)
nRBC: 0 % (ref 0.0–0.2)

## 2021-10-17 LAB — CBG MONITORING, ED: Glucose-Capillary: 208 mg/dL — ABNORMAL HIGH (ref 70–99)

## 2021-10-17 MED ORDER — SODIUM CHLORIDE 0.9 % IV BOLUS
1000.0000 mL | Freq: Once | INTRAVENOUS | Status: AC
  Administered 2021-10-17: 1000 mL via INTRAVENOUS

## 2021-10-17 MED ORDER — TETRACAINE HCL 0.5 % OP SOLN
2.0000 [drp] | Freq: Once | OPHTHALMIC | Status: AC
  Administered 2021-10-17: 2 [drp] via OPHTHALMIC
  Filled 2021-10-17: qty 4

## 2021-10-17 MED ORDER — LORAZEPAM 2 MG/ML IJ SOLN: 0.5000 mg | Freq: Once | INTRAMUSCULAR | Status: DC | PRN

## 2021-10-17 MED ORDER — ACETAMINOPHEN 325 MG PO TABS
650.0000 mg | ORAL_TABLET | Freq: Once | ORAL | Status: AC
  Administered 2021-10-17: 650 mg via ORAL
  Filled 2021-10-17: qty 2

## 2021-10-17 MED ORDER — GADOBUTROL 1 MMOL/ML IV SOLN
8.0000 mL | Freq: Once | INTRAVENOUS | Status: AC | PRN
  Administered 2021-10-17: 8 mL via INTRAVENOUS

## 2021-10-17 MED ORDER — DIPHENHYDRAMINE HCL 50 MG/ML IJ SOLN
12.5000 mg | Freq: Once | INTRAMUSCULAR | Status: AC
  Administered 2021-10-17: 12.5 mg via INTRAVENOUS
  Filled 2021-10-17: qty 1

## 2021-10-17 MED ORDER — PROCHLORPERAZINE EDISYLATE 10 MG/2ML IJ SOLN
10.0000 mg | Freq: Once | INTRAMUSCULAR | Status: AC
  Administered 2021-10-17: 10 mg via INTRAVENOUS
  Filled 2021-10-17: qty 2

## 2021-10-17 NOTE — Discharge Instructions (Signed)
I recommend you go to the emergency department for evaluation.

## 2021-10-17 NOTE — ED Provider Notes (Signed)
Patient care transferred to me.  Original MRI images and reports reviewed and there is no stroke but there is an abnormally enlarged pituitary gland.  Discussed with Dr. Yong Channel and he recommends repeating MRI with pituitary protocol which has been ordered.  He is now also complaining of a little bit of a left eyelid droop.  MRI obtained and does not give a definitive diagnosis besides enlargement of the pituitary gland.  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  I have consulted Dr. Theda Sers of neurology who will see and I have also consulted neurosurgery. Dr. Saintclair Halsted will come evaluate patient tonight in the ER. Will keep him NPO. He did well with tylenol, will give another dose since it's been 4+ hours.  CRITICAL CARE Performed by: Ephraim Hamburger   Total critical care time: 35 minutes  Critical care time was exclusive of separately billable procedures and treating other patients.  Critical care was necessary to treat or prevent imminent or life-threatening deterioration.  Critical care was time spent personally by me on the following activities: development of treatment plan with patient and/or surrogate as well as nursing, discussions with consultants, evaluation of patient's response to treatment, examination of patient, obtaining history from patient or surrogate, ordering and performing treatments and interventions, ordering and review of laboratory studies, ordering and review of radiographic studies, pulse oximetry and re-evaluation of patient's condition.    Sherwood Gambler, MD 10/17/21 [phone]

## 2021-10-17 NOTE — H&P (Addendum)
Kyle Wilcox is an 71 y.o. male.   Chief Complaint: Headaches and double vision HPI: 71 year old gentleman with a history of glaucoma, prostate cancer who began experiencing headache on Wednesday reports did begin rather abruptly.  Went to urgent care on Thursday was evaluated and was treated for an acute sinusitis was given a shot of steroids and prescribed prednisone.  On Friday the patient took prednisone but he took 3 pills 3 times a day instead of once a day and Friday evening started noticing droopiness of his eyelid double vision.  Came to the ER today to be evaluated currently still has a headache unchanged by the prednisone persistent double vision and the droopiness of his eyelid.  Denies any numbness and tingling arms or his legs  Past Medical History:  Diagnosis Date   Allergy 2014   Arthritis    Phreesia 02/16/2020   Cancer ([place]) 2015   prostate cancer   Cataract 2016   COVID-19 05/2019   COVID-19 05/2019   DDD (degenerative disc disease), lumbar 12/24/2014   GERD (gastroesophageal reflux disease)    Glaucoma    Hx of colonic polyps 02/19/2011   Hyperlipidemia 11/07/2014   Peripheral neuropathy 12/24/2014    Past Surgical History:  Procedure Laterality Date   COLONOSCOPY     EYE SURGERY     laser surgery to relieve pressure on eyes- both eyes   JOINT REPLACEMENT  2011   total shoulder replacement (Right)   LAPAROTOMY N/A 01/06/2018   Procedure: EXPLORATORY LAPAROTOMY;  Surgeon: Judeth Horn, MD;  Location: [facility];  Service: General;  Laterality: N/A;   LYSIS OF ADHESION N/A 01/06/2018   Procedure: LYSIS OF ADHESION;  Surgeon: Judeth Horn, MD;  Location: [facility];  Service: General;  Laterality: N/A;   PROSTATE SURGERY     2015   SMALL INTESTINE SURGERY N/A    Phreesia 02/16/2020   TONSILLECTOMY     TOTAL SHOULDER ARTHROPLASTY Left 03/04/2020   Procedure: TOTAL SHOULDER ARTHROPLASTY;  Surgeon: Marchia Bond, MD;  Location: [facility];  Service: Orthopedics;  Laterality: Left;    VASECTOMY  1982    Family History  Problem Relation Age of Onset   Arthritis Mother    Heart disease Mother    Hyperlipidemia Mother    Hypertension Mother    Stroke Mother    Vision loss Mother    Arthritis Father    Depression Father    Heart disease Father    Hyperlipidemia Father    Stroke Father    Arthritis Sister    Cancer Sister    Diabetes Sister    Hyperlipidemia Sister    Arthritis Brother    Diabetes Brother    Heart disease Brother    Hyperlipidemia Brother    Hypertension Brother    Stomach cancer Maternal Grandfather    Colon cancer Neg Hx    Esophageal cancer Neg Hx    Rectal cancer Neg Hx    Social History:  reports that he quit smoking about 40 years ago. His smoking use included cigarettes. He has never used smokeless tobacco. He reports current alcohol use of about 2.0 standard drinks per week. He reports that he does not use drugs.  Allergies: No Known Allergies  (Not in a hospital admission)   Results for orders placed or performed during the hospital encounter of 10/17/21 (from the past 48 hour(s))  Comprehensive metabolic panel     Status: Abnormal   Collection Time: 10/17/21 10:56 AM  Result Value  Ref Range   Sodium 131 (L) 135 - 145 mmol/L   Potassium 4.1 3.5 - 5.1 mmol/L   Chloride 94 (L) 98 - 111 mmol/L   CO2 26 22 - 32 mmol/L   Glucose, Bld 191 (H) 70 - 99 mg/dL    Comment: Glucose reference range applies only to samples taken after fasting for at least 8 hours.   BUN 10 8 - 23 mg/dL   Creatinine, Ser 0.91 0.61 - 1.24 mg/dL   Calcium 9.5 8.9 - 10.3 mg/dL   Total Protein 6.8 6.5 - 8.1 g/dL   Albumin 4.0 3.5 - 5.0 g/dL   AST 33 15 - 41 U/L   ALT 17 0 - 44 U/L   Alkaline Phosphatase 81 38 - 126 U/L   Total Bilirubin 1.1 0.3 - 1.2 mg/dL   GFR, Estimated >60 >60 mL/min    Comment: (NOTE) Calculated using the CKD-EPI Creatinine Equation (2021)    Anion gap 11 5 - 15    Comment: Performed at [facility] [address], [place], [place] [address]  CBC with Differential/Platelet     Status: Abnormal   Collection Time: 10/17/21 12:35 PM  Result Value Ref Range   WBC 17.8 (H) 4.0 - 10.5 K/uL   RBC 5.10 4.22 - 5.81 MIL/uL   Hemoglobin 16.0 13.0 - 17.0 g/dL   HCT 46.7 39.0 - 52.0 %   MCV 91.6 80.0 - 100.0 fL   MCH 31.4 26.0 - 34.0 pg   MCHC 34.3 30.0 - 36.0 g/dL   RDW 13.4 11.5 - 15.5 %   Platelets 232 150 - 400 K/uL   nRBC 0.0 0.0 - 0.2 %   Neutrophils Relative % 75 %   Neutro Abs 13.3 (H) 1.7 - 7.7 K/uL   Lymphocytes Relative 11 %   Lymphs Abs 1.9 0.7 - 4.0 K/uL   Monocytes Relative 14 %   Monocytes Absolute 2.5 (H) 0.1 - 1.0 K/uL   Eosinophils Relative 0 %   Eosinophils Absolute 0.0 0.0 - 0.5 K/uL   Basophils Relative 0 %   Basophils Absolute 0.0 0.0 - 0.1 K/uL   Immature Granulocytes 0 %   Abs Immature Granulocytes 0.08 (H) 0.00 - 0.07 K/uL    Comment: Performed at [facility] [address], [place], [place] [address]   CT Head Wo Contrast  Result Date: 10/17/2021 CLINICAL DATA:  Severe headache.  Dilated right pupil. EXAM: CT HEAD WITHOUT CONTRAST TECHNIQUE: Contiguous axial images were obtained from the base of the skull through the vertex without intravenous contrast. RADIATION DOSE REDUCTION: This exam was performed according to the departmental dose-optimization program which includes automated exposure control, adjustment of the mA and/or kV according to patient size and/or use of iterative reconstruction technique. COMPARISON:  None Available. FINDINGS: Brain: No evidence of intracranial hemorrhage, acute infarction, hydrocephalus, extra-axial collection, or mass lesion/mass effect. Vascular:  No hyperdense vessel or other acute findings. Skull: No evidence of fracture or other significant bone abnormality. Sinuses/Orbits:  No acute findings. Other: None. IMPRESSION: Negative noncontrast head CT. Electronically Signed   By: Marlaine Hind M.D.   On: 10/17/2021 11:26   MR ANGIO HEAD WO  CONTRAST  Result Date: 10/17/2021 CLINICAL DATA:  Headache, new or worsening.  Blurred vision. EXAM: MRA HEAD WITHOUT CONTRAST TECHNIQUE: Angiographic images of the Circle of Willis were acquired using MRA technique without intravenous contrast. COMPARISON:  CT and MR studies same day. FINDINGS: Anterior circulation: Both internal carotid  arteries are patent through the skull base and siphon regions. The left anterior and middle cerebral arteries are normal. The right anterior cerebral artery is normal. There is stenosis of the distal right M1 segment/middle cerebral artery bifurcation. Posterior circulation: Both vertebral arteries are patent through the skull base with the left being dominant. Both vertebral arteries reach the basilar. No basilar stenosis. Posteroinferior cerebellar arteries show flow. Both superior cerebellar arteries show flow, the right being larger than the left. Both posterior cerebral arteries are patent. Anatomic variants: None other significant. Other: None IMPRESSION: No intracranial large vessel occlusion. Stenosis of the distal right M1 segment/right MCA bifurcation which could place the patient at risk of MCA territory infarction. Electronically Signed   By: Nelson Chimes M.D.   On: 10/17/2021 18:47   MR BRAIN W CONTRAST  Result Date: 10/17/2021 CLINICAL DATA:  Follow-up examination for abnormal brain MRI. EXAM: MRI HEAD WITH CONTRAST TECHNIQUE: Multiplanar, multiecho pulse sequences of the brain and surrounding structures were obtained with intravenous contrast. CONTRAST:  74m GADAVIST GADOBUTROL 1 MMOL/ML IV SOLN COMPARISON:  Previous brain MRI from earlier the same day. FINDINGS: Limited postcontrast thin section imaging through the sella and pituitary gland was performed. Pituitary gland is again seen to be abnormally enlarged and heterogeneous measuring approximately 1.5 x 1.2 x 1.9 cm. No visible internal fluid-fluid level. Suprasellar extension to abut the undersurface of the  optic chiasm. Pituitary stalk appears slightly deviated to the right. No visible invasion of the adjacent cavernous sinus. Normal flow voids are preserved within the adjacent cavernous ICAs. Unclear whether there is actual enhancement, as this finding does demonstrate intrinsic precontrast T1 hyperintensity on prior brain MRI. IMPRESSION: Abnormally enlarged and heterogeneous appearance of the pituitary gland. Differential considerations remain the same, with possibilities including a complex Rathke's cleft cyst, pituitary macro adenoma, or pituitary apoplexy. Electronically Signed   By: BJeannine BogaM.D.   On: 10/17/2021 22:14   MR BRAIN W WO CONTRAST  Result Date: 10/17/2021 CLINICAL DATA:  Headache, new or worsening.  Blurred vision. EXAM: MRI HEAD WITHOUT AND WITH CONTRAST TECHNIQUE: Multiplanar, multiecho pulse sequences of the brain and surrounding structures were obtained without and with intravenous contrast. CONTRAST:  856mGADAVIST GADOBUTROL 1 MMOL/ML IV SOLN COMPARISON:  CT and MR studies same day. FINDINGS: Brain: Diffusion imaging does not show any acute or subacute infarction. The brainstem and cerebellum are normal. Cerebral hemispheres are similarly normal. No cortical or large vessel territory infarction. No mass, hemorrhage, hydrocephalus or extra-axial collection. The pituitary gland appears enlarged and shows increased T1 signal. The differential diagnosis is pituitary adenoma, Rathke's cleft cyst and pituitary apoplexy. In the setting acute headache, pituitary apoplexy is possible. Consider detailed pituitary sequences at this time. We will not be able to administer additional contrast, but the higher resolution imaging might be instructive. Vascular: Major vessels at the base of the brain show flow. Skull and upper cervical spine: Negative Sinuses/Orbits: Clear/normal Other: None IMPRESSION: Normal MRI of the brain itself. Abnormal pituitary gland which is enlarged and shows abnormal  signal. The differential diagnosis is pituitary adenoma versus Rathke's cleft cyst versus pituitary apoplexy. In the setting of acute headache, pituitary apoplexy is of particular concern. Consider the addition of detailed pituitary sequences. We will not be able to administer additional contrast, but the more detailed imaging might be instructive. Electronically Signed   By: MaNelson Chimes.D.   On: 10/17/2021 18:51   MR MRV HEAD W WO CONTRAST  Result Date: 10/17/2021 CLINICAL  DATA:  Headache, new or worsening.  Blurred vision. EXAM: MR VENOGRAM HEAD WITHOUT AND WITH CONTRAST TECHNIQUE: Angiographic images of the intracranial venous structures were acquired using MRV technique without and with intravenous contrast. CONTRAST:  51m GADAVIST GADOBUTROL 1 MMOL/ML IV SOLN COMPARISON:  CT and MR studies same day. FINDINGS: Superior sagittal sinus is widely patent. Transverse sinuses are patent. The right is dominant. The left is diminutive but there is no finding to suggest thrombosis. Both jugular veins show flow. IMPRESSION: No evidence of intracranial venous thrombosis. Diminutive left transverse sinus, but no finding to suggest thrombosis. Electronically Signed   By: MNelson ChimesM.D.   On: 10/17/2021 18:44    Review of Systems  Eyes:  Positive for visual disturbance.  Neurological:  Positive for headaches.   Blood pressure (!) 177/66, pulse (!) 54, temperature 98.2 F (36.8 C), temperature source Oral, resp. rate 16, height '5\' 8"'$  (1.727 m), weight 80.3 kg, SpO2 95 %. Physical Exam HENT:     Head: Normocephalic.  Eyes:     Extraocular Movements: Extraocular movements intact.  Pulmonary:     Effort: Pulmonary effort is normal.  Abdominal:     Palpations: Abdomen is soft.  Musculoskeletal:        General: Normal range of motion.  Skin:    General: Skin is warm.  Neurological:     Mental Status: He is alert.     Comments: Awake alert oriented x4 he has a left 3rd nerve palsy cranial nerves are  otherwise intact strength is 5-5 upper and lower extremities     Assessment/Plan 71year old gentleman with an acute 3rd nerve palsy visual acuity seems to be spared.  MRI and CT showed no overt evidence of hemorrhage although certainly history is suspicious.  Mass appears to abut the chiasm however compression is minimal and patient does not appear to have a decline in visual acuity.  In addition mass does not appear to invade the cavernous sinus on either side.  Will place the patient on high-dose IV steroids will discuss his case with ENT.  We will also order pituitary panel labs and may need to consider transsphenoidal apophysisectomy and decompression.  However I find this history very unusual for this mass to have changed abruptly in size and caused a 3rd nerve palsy with no overt signs of hemorrhage on the MRI scan no overt signs of cavernous sinus involvement or significant chiasmal compression or symptoms of visual acuity loss.  He did report that when he took steroids he did drive his blood glucose very high and I wonder if it is a possibility that he could have manifested a diabetic 3rd nerve palsy as this did initiate after the prednisone.  Patient has no known documented history of diabetes however he did report that his blood sugars were very high at home after taking the prednisone  GElaina Hoops MD 10/17/2021, 11:57 PM

## 2021-10-17 NOTE — Clinical Note (Incomplete)
Neurology Consult H&P  Kyle Wilcox MR# [id number] 10/17/2021   CC: blurry vision and headache  History is obtained from: *** and chart.  HPI: Kyle Wilcox is a 71 y.o. male PMHx as reviewed below presented with headache and blurry vision, double vision, dizziness since last night.  He has been taking Tylenol consistently and headache was not improving. 7/10 pain.   He was seen on 6/1 and started on prednisone.  Instructions for prednisone dosing were unclear and patient has been taking 90 mg daily.  No history of diabetes, did not eat much this morning, blood sugar in clinic is 208.  LKW: *** tNK given: No *** IR Thrombectomy No, *** {Modified Rankin Scale:21264:::1} NIHSS: ***  ROS: A complete ROS was performed and is negative except as noted in the HPI. *** Unable to assess due to encephalopathy.  Past Medical History:  Diagnosis Date  . Allergy 2014  . Arthritis    Phreesia 02/16/2020  . Cancer [facility]) 2015   prostate cancer  . Cataract 2016  . COVID-19 05/2019  . COVID-19 05/2019  . DDD (degenerative disc disease), lumbar 12/24/2014  . GERD (gastroesophageal reflux disease)   . Glaucoma   . Hx of colonic polyps 02/19/2011  . Hyperlipidemia 11/07/2014  . Peripheral neuropathy 12/24/2014     Family History  Problem Relation Age of Onset  . Arthritis Mother   . Heart disease Mother   . Hyperlipidemia Mother   . Hypertension Mother   . Stroke Mother   . Vision loss Mother   . Arthritis Father   . Depression Father   . Heart disease Father   . Hyperlipidemia Father   . Stroke Father   . Arthritis Sister   . Cancer Sister   . Diabetes Sister   . Hyperlipidemia Sister   . Arthritis Brother   . Diabetes Brother   . Heart disease Brother   . Hyperlipidemia Brother   . Hypertension Brother   . Stomach cancer Maternal Grandfather   . Colon cancer Neg Hx   . Esophageal cancer Neg Hx   . Rectal cancer Neg Hx     Social History:  reports that he quit smoking  about 40 years ago. His smoking use included cigarettes. He has never used smokeless tobacco. He reports current alcohol use of about 2.0 standard drinks per week. He reports that he does not use drugs.   Prior to Admission medications   Medication Sig Start Date End Date Taking? Authorizing Provider  aspirin 81 MG EC tablet Take 81 mg by mouth daily. Swallow whole.   Yes [provider]  atorvastatin (LIPITOR) 40 MG tablet TAKE 1 TABLET DAILY AT 6PM Patient taking differently: Take 40 mg by mouth daily after supper. 06/09/21  Yes Susy Frizzle, MD  diphenhydrAMINE (BENADRYL) 25 MG tablet Take 25 mg by mouth daily as needed for allergies.   Yes [provider]  dorzolamide-timolol (COSOPT) 22.3-6.8 MG/ML ophthalmic solution Place 1 drop into both eyes 2 (two) times daily.  09/19/18  Yes [provider]  FIBER PO Take 1 capsule by mouth 2 (two) times daily.   Yes [provider]  fluticasone (FLONASE) 50 MCG/ACT nasal spray Place 2 sprays into both nostrils daily. 10/15/21  Yes Nyoka Lint, PA-C  gabapentin (NEURONTIN) 600 MG tablet Take 1 tablet (600 mg total) by mouth 2 (two) times daily. Patient taking differently: Take 600 mg by mouth in the morning and at bedtime. 12/08/20  Yes Pickard,  Cammie Mcgee, MD  guaiFENesin (MUCINEX) 600 MG 12 hr tablet Take 600 mg by mouth 2 (two) times daily.   Yes [provider]  metroNIDAZOLE (METROGEL) 0.75 % gel Apply 1 application. topically 2 (two) times daily. Patient taking differently: Apply 1 application. topically See admin instructions. Apply to affected areas of the face 2 times a day 09/28/21  Yes Pickard, Cammie Mcgee, MD  Omega-3 Fatty Acids (OMEGA-3 FISH OIL) 300 MG CAPS Take 300 mg by mouth daily.   Yes [provider]  ondansetron (ZOFRAN) 4 MG tablet Take 1 tablet (4 mg total) by mouth every 8 (eight) hours as needed for nausea or vomiting. 10/15/21  Yes Nyoka Lint, PA-C  ONE DAILY MULTIPLE VITAMIN PO  Take 1 tablet by mouth daily.    Yes [provider]  predniSONE (DELTASONE) 10 MG tablet Take 3 tablets (30 mg total) by mouth with breakfast, with lunch, and with evening meal. 10/15/21  Yes Nyoka Lint, PA-C  traZODone (DESYREL) 50 MG tablet TAKE 1/2 TO 1 (ONE-HALF TO ONE) TABLET BY MOUTH AT BEDTIME AS NEEDED FOR SLEEP Patient taking differently: Take 25-50 mg by mouth at bedtime as needed for sleep. 12/08/20  Yes Susy Frizzle, MD  vitamin C (ASCORBIC ACID) 500 MG tablet Take 500 mg by mouth daily.   Yes [provider]  VYZULTA 0.024 % SOLN Place 1 drop into both eyes at bedtime.   Yes [provider]  meloxicam (MOBIC) 15 MG tablet Take 1 tablet (15 mg total) by mouth daily. Patient not taking: Reported on 10/17/2021 04/28/21   Susy Frizzle, MD  terbinafine (LAMISIL) 250 MG tablet Take 1 tablet (250 mg total) by mouth daily. Patient not taking: Reported on 10/17/2021 10/15/21   Garrel Ridgel, DPM    Exam: Current vital signs: BP (!) 162/67   Pulse (!) 50   Temp 97.9 F (36.6 C) (Oral)   Resp 20   Ht '5\' 8"'$  (1.727 m)   Wt 80.3 kg   SpO2 94%   BMI 26.91 kg/m   Physical Exam  Constitutional: Appears well-developed and well-nourished.  Psych: Affect appropriate to situation Eyes: No scleral injection HENT: No OP obstruction. Head: Normocephalic.  Cardiovascular: Normal rate and regular rhythm.  Respiratory: Effort normal, symmetric excursions bilaterally, no audible wheezing. GI: Soft.  No distension. There is no tenderness.  Skin: WDI  Neuro: Mental Status: Patient is awake, alert, oriented to person, place, month, year, and situation.*** Patient is able to give a clear and coherent history.*** Speech *** fluent, intact comprehension and repetition. No signs of aphasia or neglect.*** Visual Fields are full. Pupils are equal, round, and reactive to light.*** EOMI without ptosis or diplopia.  Facial sensation is symmetric to temperature Facial  movement is symmetric.  Hearing is intact to voice. Uvula midline and palate elevates symmetrically. Shoulder shrug is symmetric. Tongue is midline without atrophy or fasciculations.  Tone is normal. Bulk is normal. 5/5 strength was present in all four extremities.*** Sensation is symmetric to light touch and temperature in the arms and legs.*** Deep Tendon Reflexes: 2+ and symmetric in the biceps and patellae.*** Toes are downgoing bilaterally.*** FNF and HKS are intact bilaterally.*** Gait - Deferred***  I have reviewed labs in epic and the pertinent results are: ***  I have reviewed the images obtained: NCT head showed *** CTA head and neck showed ***  Assessment: TYSE AURIEMMA is a 71 y.o. male PMHx ***   ***Recommended aspirin '324mg'$  now.  Impression:  ***  Plan: - MRI brain without contrast. - Recommend vascular imaging with MRA head and neck. - Recommend TTE. - Recommend labs: HbA1c, lipid panel. - Recommend Statin for goal LDL <70. - Goal A1c <7. - Aspirin '81mg'$  daily. - Clopidogrel '75mg'$  daily for 3 weeks. - SBP goal <***. - Permissive hypertension first 24 h < 220/110.  - Telemetry monitoring for arrhythmia. - Recommend bedside Swallow screen. - Recommend Stroke education. - Recommend PT/OT/SLP consult. - Routine EEG. - Recommend metabolic/infectious workup with CBC, CMP, UA with UCx, CXR, CK, serum lactate.   This patient is critically ill and at significant risk of neurological worsening, death and care requires constant monitoring of vital signs, hemodynamics,respiratory and cardiac monitoring, neurological assessment, discussion with family, other specialists and medical decision making of high complexity. I spent *** minutes of neurocritical care time  in the care of  this patient. This was time spent independent of any time provided by nurse practitioner or PA.  Electronically signed by:  Lynnae Sandhoff, MD Page: [phone] 10/17/2021, 11:17 PM  If 7pm-  7am, please page neurology on call as listed in [facility].

## 2021-10-17 NOTE — ED Triage Notes (Signed)
Pt arrived POV from home c/o headache and blurred vision that started last night. Pt states he started a prescription for prednisone on Friday and was suppose to take '10mg'$  3 times a day but he misread and took '30mg'$  3 times a day. Pt states it feels like there is pressure behind his eye. Pt's left pupil is dilated.

## 2021-10-17 NOTE — ED Provider Notes (Signed)
[facility]    CSN: [id number] Arrival date & time: 10/17/21  1004     History   Chief Complaint Chief Complaint  Patient presents with   Headache    HPI Kyle Wilcox is a 71 y.o. male.  Presents with headache, blurry vision, double vision, dizziness since last night.  He has been taking Tylenol consistently and headache was not improving. 7/10 pain.  He was seen on 6/1 and started on prednisone.  Instructions for prednisone dosing were unclear and patient has been taking 90 mg daily.  No history of diabetes, did not eat much this morning, blood sugar in clinic is 208.  Past Medical History:  Diagnosis Date   Allergy 2014   Arthritis    Phreesia 02/16/2020   Cancer ([place]) 2015   prostate cancer   Cataract 2016   COVID-19 05/2019   COVID-19 05/2019   DDD (degenerative disc disease), lumbar 12/24/2014   GERD (gastroesophageal reflux disease)    Glaucoma    Hx of colonic polyps 02/19/2011   Hyperlipidemia 11/07/2014   Peripheral neuropathy 12/24/2014    Patient Active Problem List   Diagnosis Date Noted   S/P shoulder replacement, left 03/04/2020   Overweight (BMI 25.0-29.9) 03/28/2018   Abdominal distention 01/04/2018   Plantar fasciitis of right foot 11/17/2016   Chronic insomnia 10/18/2016   Male impotence 12/24/2014   DDD (degenerative disc disease), lumbar 12/24/2014   Peripheral neuropathy 12/24/2014   Glaucoma 11/07/2014   Hyperlipidemia, unspecified 11/07/2014   History of prostate cancer 11/07/2014   Hx of colonic polyps 02/19/2011    Past Surgical History:  Procedure Laterality Date   COLONOSCOPY     EYE SURGERY     laser surgery to relieve pressure on eyes- both eyes   JOINT REPLACEMENT  2011   total shoulder replacement (Right)   LAPAROTOMY N/A 01/06/2018   Procedure: EXPLORATORY LAPAROTOMY;  Surgeon: Judeth Horn, MD;  Location: [facility];  Service: General;  Laterality: N/A;   LYSIS OF ADHESION N/A 01/06/2018   Procedure: LYSIS OF ADHESION;   Surgeon: Judeth Horn, MD;  Location: [facility];  Service: General;  Laterality: N/A;   PROSTATE SURGERY     2015   SMALL INTESTINE SURGERY N/A    Phreesia 02/16/2020   TONSILLECTOMY     TOTAL SHOULDER ARTHROPLASTY Left 03/04/2020   Procedure: TOTAL SHOULDER ARTHROPLASTY;  Surgeon: Marchia Bond, MD;  Location: [facility];  Service: Orthopedics;  Laterality: Left;   VASECTOMY  1982       Home Medications    Prior to Admission medications   Medication Sig Start Date End Date Taking? Authorizing Provider  aspirin 81 MG EC tablet Take 81 mg by mouth daily. Swallow whole.    [provider]  atorvastatin (LIPITOR) 40 MG tablet TAKE 1 TABLET DAILY AT 6PM 06/09/21   Susy Frizzle, MD  diphenhydrAMINE (BENADRYL) 25 MG tablet Take 25 mg by mouth See admin instructions. Take 25 mg daily, may take a second 25 mg dose as needed for allergies    [provider]  dorzolamide-timolol (COSOPT) 22.3-6.8 MG/ML ophthalmic solution Place 1 drop into both eyes 2 (two) times daily.  09/19/18   [provider]  FIBER PO Take 1 capsule by mouth 2 (two) times daily.    [provider]  fluticasone (FLONASE) 50 MCG/ACT nasal spray Place 2 sprays into both nostrils daily. 10/15/21   Nyoka Lint, PA-C  gabapentin (NEURONTIN) 600 MG tablet Take 1 tablet (600  mg total) by mouth 2 (two) times daily. 12/08/20   Susy Frizzle, MD  Latanoprostene Bunod 0.024 % SOLN Place 1 drop into both eyes at bedtime.     [provider]  meloxicam (MOBIC) 15 MG tablet Take 1 tablet (15 mg total) by mouth daily. Patient not taking: Reported on 10/17/2021 04/28/21   Susy Frizzle, MD  metroNIDAZOLE (METROGEL) 0.75 % gel Apply 1 application. topically 2 (two) times daily. 09/28/21   Susy Frizzle, MD  Omega-3 Fatty Acids (OMEGA-3 FISH OIL) 300 MG CAPS Take 300 mg by mouth daily.    [provider]  ondansetron (ZOFRAN) 4 MG tablet Take 1 tablet (4 mg total) by mouth every 8  (eight) hours as needed for nausea or vomiting. 10/15/21   Nyoka Lint, PA-C  ONE DAILY MULTIPLE VITAMIN PO Take 1 tablet by mouth daily.     [provider]  predniSONE (DELTASONE) 10 MG tablet Take 3 tablets (30 mg total) by mouth with breakfast, with lunch, and with evening meal. 10/15/21   Nyoka Lint, PA-C  terbinafine (LAMISIL) 250 MG tablet Take 1 tablet (250 mg total) by mouth daily. Patient taking differently: Take 250 mg by mouth daily. Not started this medicine 10/15/21   Hyatt, Max T, DPM  traZODone (DESYREL) 50 MG tablet TAKE 1/2 TO 1 (ONE-HALF TO ONE) TABLET BY MOUTH AT BEDTIME AS NEEDED FOR SLEEP 12/08/20   Susy Frizzle, MD  vitamin C (ASCORBIC ACID) 500 MG tablet Take 500 mg by mouth daily.    [provider]    Family History Family History  Problem Relation Age of Onset   Arthritis Mother    Heart disease Mother    Hyperlipidemia Mother    Hypertension Mother    Stroke Mother    Vision loss Mother    Arthritis Father    Depression Father    Heart disease Father    Hyperlipidemia Father    Stroke Father    Arthritis Sister    Cancer Sister    Diabetes Sister    Hyperlipidemia Sister    Arthritis Brother    Diabetes Brother    Heart disease Brother    Hyperlipidemia Brother    Hypertension Brother    Stomach cancer Maternal Grandfather    Colon cancer Neg Hx    Esophageal cancer Neg Hx    Rectal cancer Neg Hx     Social History Social History   Tobacco Use   Smoking status: Former    Types: Cigarettes    Quit date: 03/09/1981    Years since quitting: 40.6   Smokeless tobacco: Never  Vaping Use   Vaping Use: Never used  Substance Use Topics   Alcohol use: Yes    Alcohol/week: 2.0 standard drinks    Types: 2 Cans of beer per week    Comment: occasionally   Drug use: No     Allergies   Patient has no known allergies.   Review of Systems Review of Systems  Neurological:  Positive for headaches.   Per HPI  Physical  Exam Triage Vital Signs ED Triage Vitals  Enc Vitals Group     BP 10/17/21 1028 (!) 143/60     Pulse Rate 10/17/21 1028 (!) 48     Resp 10/17/21 1028 20     Temp 10/17/21 1028 97.8 F (36.6 C)     Temp Source 10/17/21 1028 Oral     SpO2 10/17/21 1028 94 %  Weight --      Height --      Head Circumference --      Peak Flow --      Pain Score 10/17/21 1023 7     Pain Loc --      Pain Edu? --      Excl. in [place]? --    No data found.  Updated Vital Signs BP (!) 143/60 (BP Location: Left Arm)   Pulse (!) 52   Temp 97.8 F (36.6 C) (Oral)   Resp 20   SpO2 94%   Visual Acuity Right Eye Distance:   Left Eye Distance:   Bilateral Distance:    Right Eye Near:   Left Eye Near:    Bilateral Near:     Physical Exam Vitals and nursing note reviewed.  Constitutional:      Comments: Patient appears flushed, fatigued  Cardiovascular:     Rate and Rhythm: Normal rate and regular rhythm.  Pulmonary:     Effort: Pulmonary effort is normal. No respiratory distress.     Breath sounds: Normal breath sounds.  Neurological:     Mental Status: He is alert and oriented to person, place, and time.     Gait: Gait is intact.     UC Treatments / Results  Labs (all labs ordered are listed, but only abnormal results are displayed) Labs Reviewed  CBG MONITORING, ED - Abnormal; Notable for the following components:      Result Value   Glucose-Capillary 208 (*)    All other components within normal limits    EKG  Radiology No results found.  Procedures Procedures (including critical care time)  Medications Ordered in UC Medications - No data to display  Initial Impression / Assessment and Plan / UC Course  I have reviewed the triage vital signs and the nursing notes.  Pertinent labs & imaging results that were available during my care of the patient were reviewed by me and considered in my medical decision making (see chart for details).   Blood sugar in clinic today 208.   No history of diabetes.  Headache has not been controlled with medicine.  He is feeling dizzy and lightheaded.  At this time I recommend patient go to the emergency department for further evaluation.  I believe he needs a higher level of care than I can offer him at the urgent care.  Declines EMS transport - patient wife will transport him via personal vehicle to the emergency department.  He is discharged in stable condition given the duration of capabilities available at urgent care.  Final Clinical Impressions(s) / UC Diagnoses   Final diagnoses:  Acute intractable headache, unspecified headache type  Changes in vision     Discharge Instructions      I recommend you go to the emergency department for evaluation.      ED Prescriptions   None    PDMP not reviewed this encounter.   Reesa Gotschall, Wells Guiles, [place] 10/17/21 1046

## 2021-10-17 NOTE — ED Notes (Signed)
Pt transported to MRI 

## 2021-10-17 NOTE — Progress Notes (Signed)
He presents today for follow-up of his Lamisil and his Planter fasciitis he states that he has been out of Lamisil for about a month now and he states that I think it was working really well.  He states that the Planter fasciitis is better he is doing physical therapy at least once a week and he is using his orthotics.  Objective: Vital signs are stable he is alert and oriented x3 denies fever chills nausea vomit muscle aches pains calf pain back pain chest pain shortness of breath with the use of the Lamisil.  There is also no itching or rashes.  He has minimal pain on palpation medial calcaneal tubercle of the heel.  Assessment: Resolving Planter fasciitis.  Resolving onychomycosis long-term therapy with Lamisil.  Plan: We are going to start him back on Lamisil 1 tablet every other day for the next 60 days I will follow-up with him in 3 months however I did recommend that he continue his physical therapy and orthotics.

## 2021-10-17 NOTE — ED Notes (Signed)
Notified Rebecca, NP of patient status and abnormal heart rate

## 2021-10-17 NOTE — ED Triage Notes (Signed)
Headache, blurred vision and dizziness since last night.  Patient was seen at [facility] 10/15/2021.  Patient started prednisone on Friday morning (6/1).  Patient has taken 3 prednisone at each meal.  Instructions are not clear.  Patient reports forehead is hurting.

## 2021-10-17 NOTE — ED Notes (Signed)
Patient is being discharged from the Urgent Care and sent to the Emergency Department via pov . Per Wells Guiles, NP, patient is in need of higher level of care due to limited resources at [facility] and presentation, wife with patient. Patient is aware and verbalizes understanding of plan of care.  Vitals:   10/17/21 1028 10/17/21 1032  BP: (!) 143/60   Pulse: (!) 48 (!) 52  Resp: 20   Temp: 97.8 F (36.6 C)   SpO2: 94%

## 2021-10-17 NOTE — ED Provider Notes (Signed)
[facility] EMERGENCY DEPARTMENT Provider Note   CSN: [id number] Arrival date & time: 10/17/21  1051     History  Chief Complaint  Patient presents with   Blurred Vision   Headache    Kyle Wilcox is a 70 y.o. male.  Patient here with headache, blurred vision.  History of allergies, high cholesterol, prostate cancer, glaucoma.  Was seen in urgent care several days ago for the same and started on steroids.  He accidentally was taking the wrong dose and thinks he took 30 mg of prednisone 3 times a day yesterday.  Blurred vision started last night.  Has continued till this morning with pain behind the left eye and headache as well.  Nothing makes it worse or better.  Denies any chest pain, shortness of breath, weakness, numbness, speech changes.  Uses eyedrops and follows with Dr. Manuella Ghazi with ophthalmology for his glaucoma.  Takes Cosopt.  The history is provided by the patient.      Home Medications Prior to Admission medications   Medication Sig Start Date End Date Taking? Authorizing Provider  aspirin 81 MG EC tablet Take 81 mg by mouth daily. Swallow whole.    [provider]  atorvastatin (LIPITOR) 40 MG tablet TAKE 1 TABLET DAILY AT 6PM 06/09/21   Susy Frizzle, MD  diphenhydrAMINE (BENADRYL) 25 MG tablet Take 25 mg by mouth See admin instructions. Take 25 mg daily, may take a second 25 mg dose as needed for allergies    [provider]  dorzolamide-timolol (COSOPT) 22.3-6.8 MG/ML ophthalmic solution Place 1 drop into both eyes 2 (two) times daily.  09/19/18   [provider]  FIBER PO Take 1 capsule by mouth 2 (two) times daily.    [provider]  fluticasone (FLONASE) 50 MCG/ACT nasal spray Place 2 sprays into both nostrils daily. 10/15/21   Nyoka Lint, PA-C  gabapentin (NEURONTIN) 600 MG tablet Take 1 tablet (600 mg total) by mouth 2 (two) times daily. 12/08/20   Susy Frizzle, MD  Latanoprostene Bunod 0.024 % SOLN  Place 1 drop into both eyes at bedtime.     [provider]  meloxicam (MOBIC) 15 MG tablet Take 1 tablet (15 mg total) by mouth daily. Patient not taking: Reported on 10/17/2021 04/28/21   Susy Frizzle, MD  metroNIDAZOLE (METROGEL) 0.75 % gel Apply 1 application. topically 2 (two) times daily. 09/28/21   Susy Frizzle, MD  Omega-3 Fatty Acids (OMEGA-3 FISH OIL) 300 MG CAPS Take 300 mg by mouth daily.    [provider]  ondansetron (ZOFRAN) 4 MG tablet Take 1 tablet (4 mg total) by mouth every 8 (eight) hours as needed for nausea or vomiting. 10/15/21   Nyoka Lint, PA-C  ONE DAILY MULTIPLE VITAMIN PO Take 1 tablet by mouth daily.     [provider]  predniSONE (DELTASONE) 10 MG tablet Take 3 tablets (30 mg total) by mouth with breakfast, with lunch, and with evening meal. 10/15/21   Nyoka Lint, PA-C  terbinafine (LAMISIL) 250 MG tablet Take 1 tablet (250 mg total) by mouth daily. Patient taking differently: Take 250 mg by mouth daily. Not started this medicine 10/15/21   Hyatt, Max T, DPM  traZODone (DESYREL) 50 MG tablet TAKE 1/2 TO 1 (ONE-HALF TO ONE) TABLET BY MOUTH AT BEDTIME AS NEEDED FOR SLEEP 12/08/20   Susy Frizzle, MD  vitamin C (ASCORBIC ACID) 500 MG tablet Take 500 mg by mouth daily.  [provider]      Allergies    Patient has no known allergies.    Review of Systems   Review of Systems  Physical Exam Updated Vital Signs BP (!) 179/62   Pulse (!) 46   Temp 97.9 F (36.6 C) (Oral)   Resp 17   Ht '5\' 8"'$  (1.727 m)   Wt 80.3 kg   SpO2 94%   BMI 26.91 kg/m  Physical Exam Vitals and nursing note reviewed.  Constitutional:      General: He is not in acute distress.    Appearance: He is well-developed. He is not ill-appearing.  HENT:     Head: Normocephalic and atraumatic.     Nose: Nose normal.     Mouth/Throat:     Mouth: Mucous membranes are moist.  Eyes:     Conjunctiva/sclera: Conjunctivae normal.     Comments:  Right pupil is 2 mm and minimally reactive, left pupil is about 4 mm and minimally reactive, extraocular movements are intact  Cardiovascular:     Rate and Rhythm: Normal rate and regular rhythm.     Heart sounds: No murmur heard. Pulmonary:     Effort: Pulmonary effort is normal. No respiratory distress.     Breath sounds: Normal breath sounds.  Abdominal:     Palpations: Abdomen is soft.     Tenderness: There is no abdominal tenderness.  Musculoskeletal:        General: No swelling.     Cervical back: Normal range of motion and neck supple.  Skin:    General: Skin is warm and dry.     Capillary Refill: Capillary refill takes less than 2 seconds.  Neurological:     General: No focal deficit present.     Mental Status: He is alert and oriented to person, place, and time.     Cranial Nerves: No cranial nerve deficit.     Sensory: No sensory deficit.     Motor: No weakness.     Coordination: Coordination normal.     Comments: No visual field deficit, 5+ out of 5 strength, normal speech, normal finger-nose-finger, normal sensation, no drift  Psychiatric:        Mood and Affect: Mood normal.    ED Results / Procedures / Treatments   Labs (all labs ordered are listed, but only abnormal results are displayed) Labs Reviewed  COMPREHENSIVE METABOLIC PANEL - Abnormal; Notable for the following components:      Result Value   Sodium 131 (*)    Chloride 94 (*)    Glucose, Bld 191 (*)    All other components within normal limits  CBC WITH DIFFERENTIAL/PLATELET - Abnormal; Notable for the following components:   WBC 17.8 (*)    Neutro Abs 13.3 (*)    Monocytes Absolute 2.5 (*)    Abs Immature Granulocytes 0.08 (*)    All other components within normal limits  CBC WITH DIFFERENTIAL/PLATELET    EKG EKG Interpretation  Date/Time:  Saturday October 17 2021 11:08:06 EDT Ventricular Rate:  44 PR Interval:  161 QRS Duration: 134 QT Interval:  465 QTC Calculation: 398 R  Axis:   -21 Text Interpretation: Sinus bradycardia Confirmed by Lennice Sites ([id number]) on 10/17/2021 11:24:46 AM  Radiology CT Head Wo Contrast  Result Date: 10/17/2021 CLINICAL DATA:  Severe headache.  Dilated right pupil. EXAM: CT HEAD WITHOUT CONTRAST TECHNIQUE: Contiguous axial images were obtained from the base of the skull through the vertex without intravenous contrast. RADIATION  DOSE REDUCTION: This exam was performed according to the departmental dose-optimization program which includes automated exposure control, adjustment of the mA and/or kV according to patient size and/or use of iterative reconstruction technique. COMPARISON:  None Available. FINDINGS: Brain: No evidence of intracranial hemorrhage, acute infarction, hydrocephalus, extra-axial collection, or mass lesion/mass effect. Vascular:  No hyperdense vessel or other acute findings. Skull: No evidence of fracture or other significant bone abnormality. Sinuses/Orbits:  No acute findings. Other: None. IMPRESSION: Negative noncontrast head CT. Electronically Signed   By: Marlaine Hind M.D.   On: 10/17/2021 11:26    Procedures Procedures    Medications Ordered in ED Medications  sodium chloride 0.9 % bolus 1,000 mL (1,000 mLs Intravenous New Bag/Given 10/17/21 1138)  tetracaine (PONTOCAINE) 0.5 % ophthalmic solution 2 drop (2 drops Both Eyes Given 10/17/21 1125)  prochlorperazine (COMPAZINE) injection 10 mg (10 mg Intravenous Given 10/17/21 1236)  diphenhydrAMINE (BENADRYL) injection 12.5 mg (12.5 mg Intravenous Given 10/17/21 1236)    ED Course/ Medical Decision Making/ A&P                           Medical Decision Making Amount and/or Complexity of Data Reviewed Labs: ordered. Radiology: ordered.  Risk Prescription drug management.   CASPAR FAVILA is a 71 year old male with history of glaucoma, high last drawl, prostate cancer in remission who presents to the ED with headache, blurred vision.  Mostly left-sided headache behind  the left eye.  He has been having headaches for the last several days but blurred vision started last night.  Denies any weakness or numbness otherwise.  He does have dilated pupil on the left larger than on the right that is minimally reactive.  Eye pressures in the left eye have been 31, 26, 26, 17, 17, 17.  Eye pressures in the right eye have been 12 several times.  He otherwise has normal neurologic exam.  Eye pressures have been variable in the left eye.  Mostly concern for acute glaucoma but given inconsistencies in the eye pressure feel the need to get more advanced head imaging to rule out aneurysm, brain mass, head bleed.  Noncontrasted head CT done per my review and interpretation shows no acute process.  I will talk with ophthalmology about pursuing MRI and CTA of head and neck for further rule out intracranial sources for dilated pupil and headache.  But my suspicion is that he does have acute glaucoma.  He is on Cosopt drops chronically for this problem.  I will reach out to ophthalmology.  I have talked with Dr. Tobe Sos with ophthalmology.  He agrees given the differences of the Tono-Pen pursuing further work-up to rule out intracranial process/aneurysm.  If this work-up is unremarkable he would like to be repaged to arrange for evaluation/follow-up given the clinical concern for suspected acute angle glaucoma.  Per my review and interpretation of labs no significant anemia.  Blood sugar 191 but otherwise lab work is unremarkable.  White count of 17 but likely in the setting of recent steroid use.  Patient is awaiting MRI of the brain at time of handoff to oncoming ED staff.  Please see their note for further results, evaluation, disposition of the patient.  This chart was dictated using voice recognition software.  Despite best efforts to proofread,  errors can occur which can change the documentation meaning.         Final Clinical Impression(s) / ED Diagnoses Final diagnoses:   Blurred  vision  Nonintractable headache, unspecified chronicity pattern, unspecified headache type    Rx / DC Orders ED Discharge Orders     None         Lennice Sites, DO 10/17/21 1406

## 2021-10-18 DIAGNOSIS — E230 Hypopituitarism: Secondary | ICD-10-CM | POA: Diagnosis not present

## 2021-10-18 DIAGNOSIS — M1990 Unspecified osteoarthritis, unspecified site: Secondary | ICD-10-CM | POA: Diagnosis present

## 2021-10-18 DIAGNOSIS — Z8546 Personal history of malignant neoplasm of prostate: Secondary | ICD-10-CM | POA: Diagnosis not present

## 2021-10-18 DIAGNOSIS — H409 Unspecified glaucoma: Secondary | ICD-10-CM | POA: Diagnosis present

## 2021-10-18 DIAGNOSIS — Z01818 Encounter for other preprocedural examination: Secondary | ICD-10-CM | POA: Diagnosis not present

## 2021-10-18 DIAGNOSIS — G629 Polyneuropathy, unspecified: Secondary | ICD-10-CM | POA: Diagnosis present

## 2021-10-18 DIAGNOSIS — Z96611 Presence of right artificial shoulder joint: Secondary | ICD-10-CM | POA: Diagnosis present

## 2021-10-18 DIAGNOSIS — Z818 Family history of other mental and behavioral disorders: Secondary | ICD-10-CM | POA: Diagnosis not present

## 2021-10-18 DIAGNOSIS — M5136 Other intervertebral disc degeneration, lumbar region: Secondary | ICD-10-CM | POA: Diagnosis present

## 2021-10-18 DIAGNOSIS — Z8616 Personal history of COVID-19: Secondary | ICD-10-CM | POA: Diagnosis not present

## 2021-10-18 DIAGNOSIS — H4902 Third [oculomotor] nerve palsy, left eye: Secondary | ICD-10-CM | POA: Diagnosis present

## 2021-10-18 DIAGNOSIS — D352 Benign neoplasm of pituitary gland: Secondary | ICD-10-CM | POA: Diagnosis present

## 2021-10-18 DIAGNOSIS — Z83438 Family history of other disorder of lipoprotein metabolism and other lipidemia: Secondary | ICD-10-CM | POA: Diagnosis not present

## 2021-10-18 DIAGNOSIS — H538 Other visual disturbances: Secondary | ICD-10-CM | POA: Diagnosis present

## 2021-10-18 DIAGNOSIS — H4900 Third [oculomotor] nerve palsy, unspecified eye: Secondary | ICD-10-CM | POA: Diagnosis not present

## 2021-10-18 DIAGNOSIS — Z8261 Family history of arthritis: Secondary | ICD-10-CM | POA: Diagnosis not present

## 2021-10-18 DIAGNOSIS — J342 Deviated nasal septum: Secondary | ICD-10-CM | POA: Diagnosis present

## 2021-10-18 DIAGNOSIS — Z7982 Long term (current) use of aspirin: Secondary | ICD-10-CM | POA: Diagnosis not present

## 2021-10-18 DIAGNOSIS — Z79899 Other long term (current) drug therapy: Secondary | ICD-10-CM | POA: Diagnosis not present

## 2021-10-18 DIAGNOSIS — Z800 Family history of malignant neoplasm of digestive organs: Secondary | ICD-10-CM | POA: Diagnosis not present

## 2021-10-18 DIAGNOSIS — Z8249 Family history of ischemic heart disease and other diseases of the circulatory system: Secondary | ICD-10-CM | POA: Diagnosis not present

## 2021-10-18 DIAGNOSIS — Z96612 Presence of left artificial shoulder joint: Secondary | ICD-10-CM | POA: Diagnosis present

## 2021-10-18 DIAGNOSIS — Z821 Family history of blindness and visual loss: Secondary | ICD-10-CM | POA: Diagnosis not present

## 2021-10-18 DIAGNOSIS — Z791 Long term (current) use of non-steroidal anti-inflammatories (NSAID): Secondary | ICD-10-CM | POA: Diagnosis not present

## 2021-10-18 DIAGNOSIS — Z823 Family history of stroke: Secondary | ICD-10-CM | POA: Diagnosis not present

## 2021-10-18 DIAGNOSIS — K219 Gastro-esophageal reflux disease without esophagitis: Secondary | ICD-10-CM | POA: Diagnosis present

## 2021-10-18 DIAGNOSIS — E7800 Pure hypercholesterolemia, unspecified: Secondary | ICD-10-CM | POA: Diagnosis present

## 2021-10-18 DIAGNOSIS — D3A8 Other benign neuroendocrine tumors: Secondary | ICD-10-CM | POA: Diagnosis not present

## 2021-10-18 DIAGNOSIS — Z833 Family history of diabetes mellitus: Secondary | ICD-10-CM | POA: Diagnosis not present

## 2021-10-18 DIAGNOSIS — E236 Other disorders of pituitary gland: Secondary | ICD-10-CM | POA: Diagnosis not present

## 2021-10-18 LAB — PROTIME-INR
INR: 1 (ref 0.8–1.2)
Prothrombin Time: 13.4 seconds (ref 11.4–15.2)

## 2021-10-18 LAB — CBC
HCT: 45.7 % (ref 39.0–52.0)
Hemoglobin: 15.7 g/dL (ref 13.0–17.0)
MCH: 31.2 pg (ref 26.0–34.0)
MCHC: 34.4 g/dL (ref 30.0–36.0)
MCV: 90.9 fL (ref 80.0–100.0)
Platelets: 187 10*3/uL (ref 150–400)
RBC: 5.03 MIL/uL (ref 4.22–5.81)
RDW: 13.4 % (ref 11.5–15.5)
WBC: 14.5 10*3/uL — ABNORMAL HIGH (ref 4.0–10.5)
nRBC: 0 % (ref 0.0–0.2)

## 2021-10-18 LAB — COMPREHENSIVE METABOLIC PANEL
ALT: 17 U/L (ref 0–44)
AST: 18 U/L (ref 15–41)
Albumin: 3.2 g/dL — ABNORMAL LOW (ref 3.5–5.0)
Alkaline Phosphatase: 65 U/L (ref 38–126)
Anion gap: 10 (ref 5–15)
BUN: 6 mg/dL — ABNORMAL LOW (ref 8–23)
CO2: 32 mmol/L (ref 22–32)
Calcium: 9 mg/dL (ref 8.9–10.3)
Chloride: 96 mmol/L — ABNORMAL LOW (ref 98–111)
Creatinine, Ser: 0.95 mg/dL (ref 0.61–1.24)
GFR, Estimated: >60 mL/min (ref >60)
Glucose, Bld: 108 mg/dL — ABNORMAL HIGH (ref 70–99)
Potassium: 3 mmol/L — ABNORMAL LOW (ref 3.5–5.1)
Sodium: 138 mmol/L (ref 135–145)
Total Bilirubin: 0.8 mg/dL (ref 0.3–1.2)
Total Protein: 6.3 g/dL — ABNORMAL LOW (ref 6.5–8.1)

## 2021-10-18 LAB — CBG MONITORING, ED: Glucose-Capillary: 159 mg/dL — ABNORMAL HIGH (ref 70–99)

## 2021-10-18 LAB — GLUCOSE, CAPILLARY
Glucose-Capillary: 234 mg/dL — ABNORMAL HIGH (ref 70–99)
Glucose-Capillary: 214 mg/dL — ABNORMAL HIGH (ref 70–99)
Glucose-Capillary: 160 mg/dL — ABNORMAL HIGH (ref 70–99)

## 2021-10-18 LAB — TSH: TSH: 0.984 u[IU]/mL (ref 0.350–4.500)

## 2021-10-18 LAB — HEMOGLOBIN A1C
Hgb A1c MFr Bld: 5.4 % (ref 4.8–5.6)
Mean Plasma Glucose: 108.28 mg/dL

## 2021-10-18 MED ORDER — DEXAMETHASONE SODIUM PHOSPHATE 10 MG/ML IJ SOLN
8.0000 mg | Freq: Four times a day (QID) | INTRAMUSCULAR | Status: DC
Start: 2021-10-18 — End: 2021-10-21
  Administered 2021-10-18 – 2021-10-21 (×13): 8 mg via INTRAVENOUS
  Filled 2021-10-18 (×13): qty 1

## 2021-10-18 MED ORDER — INSULIN ASPART 100 UNIT/ML IJ SOLN
0.0000 [IU] | Freq: Three times a day (TID) | INTRAMUSCULAR | Status: DC
  Administered 2021-10-18: 3 [IU] via SUBCUTANEOUS
  Administered 2021-10-18: 5 [IU] via SUBCUTANEOUS
  Administered 2021-10-19: 2 [IU] via SUBCUTANEOUS
  Administered 2021-10-19: 11 [IU] via SUBCUTANEOUS
  Administered 2021-10-19 – 2021-10-21 (×2): 3 [IU] via SUBCUTANEOUS
  Administered 2021-10-21: 2 [IU] via SUBCUTANEOUS
  Administered 2021-10-21: 3 [IU] via SUBCUTANEOUS
  Administered 2021-10-22 (×2): 2 [IU] via SUBCUTANEOUS

## 2021-10-18 MED ORDER — HYDROCODONE-ACETAMINOPHEN 5-325 MG PO TABS
1.0000 | ORAL_TABLET | ORAL | Status: DC | PRN
  Administered 2021-10-18: 1 via ORAL
  Filled 2021-10-18 (×2): qty 1

## 2021-10-18 MED ORDER — ACETAMINOPHEN 160 MG/5ML PO SOLN: 650.0000 mg | ORAL | Status: DC | PRN

## 2021-10-18 MED ORDER — ACETAMINOPHEN 650 MG RE SUPP: 650.0000 mg | RECTAL | Status: DC | PRN

## 2021-10-18 MED ORDER — ACETAMINOPHEN 325 MG PO TABS: 650.0000 mg | ORAL_TABLET | ORAL | Status: DC | PRN

## 2021-10-18 MED ORDER — SENNOSIDES-DOCUSATE SODIUM 8.6-50 MG PO TABS
1.0000 | ORAL_TABLET | Freq: Two times a day (BID) | ORAL | Status: DC
  Administered 2021-10-18 – 2021-10-21 (×3): 1 via ORAL
  Filled 2021-10-18 (×9): qty 1

## 2021-10-18 MED ORDER — HYDROMORPHONE HCL 1 MG/ML IJ SOLN: 0.5000 mg | INTRAMUSCULAR | Status: DC | PRN

## 2021-10-18 MED ORDER — PANTOPRAZOLE SODIUM 40 MG IV SOLR
40.0000 mg | Freq: Every day | INTRAVENOUS | Status: DC
  Administered 2021-10-18: 40 mg via INTRAVENOUS
  Filled 2021-10-18: qty 10

## 2021-10-18 NOTE — ED Notes (Signed)
Pt is alert and oriented x 4. Reports no relief from headache all night. Will notify admit MD for PRN headache medications. Will continue to monitor.

## 2021-10-18 NOTE — Plan of Care (Signed)

## 2021-10-18 NOTE — TOC Initial Note (Signed)
Transition of Care [facility]) - Initial/Assessment Note    Patient Details  Name: Kyle Wilcox MRN: [id number] Date of Birth: Jul 12, 1950  Transition of Care [facility]) CM/SW Contact:    Verdell Carmine, RN Phone Number: 10/18/2021, 12:12 PM  Clinical Narrative:                    Transition of Care Department [facility]) has reviewed patient and no TOC needs have been identified at this time. We will continue to monitor patient advancement through interdisciplinary progression rounds. If new patient transition needs arise, please place a TOC consult.     Patient Goals and CMS Choice        Expected Discharge Plan and Services                                                Prior Living Arrangements/Services                       Activities of Daily Living      Permission Sought/Granted                  Emotional Assessment              Admission diagnosis:  Pituitary adenoma [facility]) [D35.2] Patient Active Problem List   Diagnosis Date Noted   Pituitary adenoma ([place]) 10/18/2021   S/P shoulder replacement, left 03/04/2020   Overweight (BMI 25.0-29.9) 03/28/2018   Abdominal distention 01/04/2018   Plantar fasciitis of right foot 11/17/2016   Chronic insomnia 10/18/2016   Male impotence 12/24/2014   DDD (degenerative disc disease), lumbar 12/24/2014   Peripheral neuropathy 12/24/2014   Glaucoma 11/07/2014   Hyperlipidemia, unspecified 11/07/2014   History of prostate cancer 11/07/2014   Hx of colonic polyps 02/19/2011   PCP:  Susy Frizzle, MD Pharmacy:   [facility] ([place]), [place] - [address] [place] [address] Phone: [phone] Fax: [phone]     Social Determinants of Health (SDOH) Interventions    Readmission Risk Interventions     View : No data to display.

## 2021-10-18 NOTE — ED Notes (Signed)
Breakfast tray ordered 

## 2021-10-18 NOTE — Progress Notes (Signed)
Subjective: Patient reports  overall no significant change still with headache and double vision still reports no change in visual acuity that is still at his baseline.  Objective: Vital signs in last 24 hours: Temp:  [97.8 F (36.6 C)-99.1 F (37.3 C)] 99.1 F (37.3 C) (06/04 0724) Pulse Rate:  [42-56] 50 (06/04 0810) Resp:  [13-25] 18 (06/04 0810) BP: (127-192)/(49-89) 188/76 (06/04 0810) SpO2:  [92 %-99 %] 96 % (06/04 0810) Weight:  [80.3 kg] 80.3 kg (06/03 1104)  Intake/Output from previous day: 06/03 0701 - 06/04 0700 In: 1000 [IV Piggyback:1000] Out: 350 [Urine:350] Intake/Output this shift: No intake/output data recorded.  Awake and alert left 3rd nerve palsy stable  Lab Results: Recent Labs    10/17/21 1235  WBC 17.8*  HGB 16.0  HCT 46.7  PLT 232   BMET Recent Labs    10/17/21 1056  NA 131*  K 4.1  CL 94*  CO2 26  GLUCOSE 191*  BUN 10  CREATININE 0.91  CALCIUM 9.5    Studies/Results: CT Head Wo Contrast  Result Date: 10/17/2021 CLINICAL DATA:  Severe headache.  Dilated right pupil. EXAM: CT HEAD WITHOUT CONTRAST TECHNIQUE: Contiguous axial images were obtained from the base of the skull through the vertex without intravenous contrast. RADIATION DOSE REDUCTION: This exam was performed according to the departmental dose-optimization program which includes automated exposure control, adjustment of the mA and/or kV according to patient size and/or use of iterative reconstruction technique. COMPARISON:  None Available. FINDINGS: Brain: No evidence of intracranial hemorrhage, acute infarction, hydrocephalus, extra-axial collection, or mass lesion/mass effect. Vascular:  No hyperdense vessel or other acute findings. Skull: No evidence of fracture or other significant bone abnormality. Sinuses/Orbits:  No acute findings. Other: None. IMPRESSION: Negative noncontrast head CT. Electronically Signed   By: Marlaine Hind M.D.   On: 10/17/2021 11:26   MR ANGIO HEAD WO  CONTRAST  Result Date: 10/17/2021 CLINICAL DATA:  Headache, new or worsening.  Blurred vision. EXAM: MRA HEAD WITHOUT CONTRAST TECHNIQUE: Angiographic images of the Circle of Willis were acquired using MRA technique without intravenous contrast. COMPARISON:  CT and MR studies same day. FINDINGS: Anterior circulation: Both internal carotid arteries are patent through the skull base and siphon regions. The left anterior and middle cerebral arteries are normal. The right anterior cerebral artery is normal. There is stenosis of the distal right M1 segment/middle cerebral artery bifurcation. Posterior circulation: Both vertebral arteries are patent through the skull base with the left being dominant. Both vertebral arteries reach the basilar. No basilar stenosis. Posteroinferior cerebellar arteries show flow. Both superior cerebellar arteries show flow, the right being larger than the left. Both posterior cerebral arteries are patent. Anatomic variants: None other significant. Other: None IMPRESSION: No intracranial large vessel occlusion. Stenosis of the distal right M1 segment/right MCA bifurcation which could place the patient at risk of MCA territory infarction. Electronically Signed   By: Nelson Chimes M.D.   On: 10/17/2021 18:47   MR BRAIN W CONTRAST  Result Date: 10/17/2021 CLINICAL DATA:  Follow-up examination for abnormal brain MRI. EXAM: MRI HEAD WITH CONTRAST TECHNIQUE: Multiplanar, multiecho pulse sequences of the brain and surrounding structures were obtained with intravenous contrast. CONTRAST:  21m GADAVIST GADOBUTROL 1 MMOL/ML IV SOLN COMPARISON:  Previous brain MRI from earlier the same day. FINDINGS: Limited postcontrast thin section imaging through the sella and pituitary gland was performed. Pituitary gland is again seen to be abnormally enlarged and heterogeneous measuring approximately 1.5 x 1.2 x 1.9 cm.  No visible internal fluid-fluid level. Suprasellar extension to abut the undersurface of the  optic chiasm. Pituitary stalk appears slightly deviated to the right. No visible invasion of the adjacent cavernous sinus. Normal flow voids are preserved within the adjacent cavernous ICAs. Unclear whether there is actual enhancement, as this finding does demonstrate intrinsic precontrast T1 hyperintensity on prior brain MRI. IMPRESSION: Abnormally enlarged and heterogeneous appearance of the pituitary gland. Differential considerations remain the same, with possibilities including a complex Rathke's cleft cyst, pituitary macro adenoma, or pituitary apoplexy. Electronically Signed   By: Jeannine Boga M.D.   On: 10/17/2021 22:14   MR BRAIN W WO CONTRAST  Result Date: 10/17/2021 CLINICAL DATA:  Headache, new or worsening.  Blurred vision. EXAM: MRI HEAD WITHOUT AND WITH CONTRAST TECHNIQUE: Multiplanar, multiecho pulse sequences of the brain and surrounding structures were obtained without and with intravenous contrast. CONTRAST:  36m GADAVIST GADOBUTROL 1 MMOL/ML IV SOLN COMPARISON:  CT and MR studies same day. FINDINGS: Brain: Diffusion imaging does not show any acute or subacute infarction. The brainstem and cerebellum are normal. Cerebral hemispheres are similarly normal. No cortical or large vessel territory infarction. No mass, hemorrhage, hydrocephalus or extra-axial collection. The pituitary gland appears enlarged and shows increased T1 signal. The differential diagnosis is pituitary adenoma, Rathke's cleft cyst and pituitary apoplexy. In the setting acute headache, pituitary apoplexy is possible. Consider detailed pituitary sequences at this time. We will not be able to administer additional contrast, but the higher resolution imaging might be instructive. Vascular: Major vessels at the base of the brain show flow. Skull and upper cervical spine: Negative Sinuses/Orbits: Clear/normal Other: None IMPRESSION: Normal MRI of the brain itself. Abnormal pituitary gland which is enlarged and shows abnormal  signal. The differential diagnosis is pituitary adenoma versus Rathke's cleft cyst versus pituitary apoplexy. In the setting of acute headache, pituitary apoplexy is of particular concern. Consider the addition of detailed pituitary sequences. We will not be able to administer additional contrast, but the more detailed imaging might be instructive. Electronically Signed   By: MNelson ChimesM.D.   On: 10/17/2021 18:51   MR MRV HEAD W WO CONTRAST  Result Date: 10/17/2021 CLINICAL DATA:  Headache, new or worsening.  Blurred vision. EXAM: MR VENOGRAM HEAD WITHOUT AND WITH CONTRAST TECHNIQUE: Angiographic images of the intracranial venous structures were acquired using MRV technique without and with intravenous contrast. CONTRAST:  875mGADAVIST GADOBUTROL 1 MMOL/ML IV SOLN COMPARISON:  CT and MR studies same day. FINDINGS: Superior sagittal sinus is widely patent. Transverse sinuses are patent. The right is dominant. The left is diminutive but there is no finding to suggest thrombosis. Both jugular veins show flow. IMPRESSION: No evidence of intracranial venous thrombosis. Diminutive left transverse sinus, but no finding to suggest thrombosis. Electronically Signed   By: MaNelson Chimes.D.   On: 10/17/2021 18:44    Assessment/Plan: [age]ear old with acute left 3rd nerve palsy on Friday evening.  Patient with stable headache patient did not receive pain medication or steroids last night because admission orders were not signed off on from the computer so unclear benefit of steroid yet.  However of extensive conversations with 1 my partners and ENT and plan is to stabilize the patient on IV steroids and plan pituitary transsphenoidal hypophysis ectomy early this week  LOS: 0 days     GaElaina Hoops/08/2021, 8:29 AM

## 2021-10-19 ENCOUNTER — Other Ambulatory Visit: Payer: Self-pay | Admitting: Otolaryngology

## 2021-10-19 ENCOUNTER — Other Ambulatory Visit: Payer: Self-pay | Admitting: Neurosurgery

## 2021-10-19 ENCOUNTER — Inpatient Hospital Stay (HOSPITAL_COMMUNITY): Payer: Medicare Other

## 2021-10-19 DIAGNOSIS — D352 Benign neoplasm of pituitary gland: Secondary | ICD-10-CM

## 2021-10-19 LAB — GLUCOSE, CAPILLARY
Glucose-Capillary: 167 mg/dL — ABNORMAL HIGH (ref 70–99)
Glucose-Capillary: 305 mg/dL — ABNORMAL HIGH (ref 70–99)
Glucose-Capillary: 140 mg/dL — ABNORMAL HIGH (ref 70–99)
Glucose-Capillary: 166 mg/dL — ABNORMAL HIGH (ref 70–99)

## 2021-10-19 MED ORDER — LATANOPROSTENE BUNOD 0.024 % OP SOLN
1.0000 [drp] | Freq: Every day | OPHTHALMIC | Status: DC
Start: 2021-10-19 — End: 2021-10-19

## 2021-10-19 MED ORDER — OMEGA-3 FISH OIL 300 MG PO CAPS: 300.0000 mg | ORAL_CAPSULE | Freq: Every day | ORAL | Status: DC

## 2021-10-19 MED ORDER — LATANOPROST 0.005 % OP SOLN
1.0000 [drp] | Freq: Every day | OPHTHALMIC | Status: DC
  Administered 2021-10-20 – 2021-10-22 (×3): 1 [drp] via OPHTHALMIC
  Filled 2021-10-19: qty 2.5

## 2021-10-19 MED ORDER — TERBINAFINE HCL 250 MG PO TABS: 250.0000 mg | ORAL_TABLET | Freq: Every day | ORAL | Status: DC

## 2021-10-19 MED ORDER — FLUTICASONE PROPIONATE 50 MCG/ACT NA SUSP
2.0000 | Freq: Every day | NASAL | Status: DC
  Administered 2021-10-19 – 2021-10-23 (×4): 2 via NASAL
  Filled 2021-10-19: qty 16

## 2021-10-19 MED ORDER — METRONIDAZOLE 0.75 % EX GEL
1.0000 "application " | Freq: Two times a day (BID) | CUTANEOUS | Status: DC
  Administered 2021-10-19 – 2021-10-23 (×6): 1 via TOPICAL
  Filled 2021-10-19: qty 45

## 2021-10-19 MED ORDER — TRAZODONE HCL 50 MG PO TABS
25.0000 mg | ORAL_TABLET | Freq: Every evening | ORAL | Status: DC | PRN
  Administered 2021-10-19 – 2021-10-22 (×3): 50 mg via ORAL
  Filled 2021-10-19 (×3): qty 1

## 2021-10-19 MED ORDER — ASCORBIC ACID 500 MG PO TABS
500.0000 mg | ORAL_TABLET | Freq: Every day | ORAL | Status: DC
  Administered 2021-10-19 – 2021-10-22 (×2): 500 mg via ORAL
  Filled 2021-10-19 (×4): qty 1

## 2021-10-19 MED ORDER — NUTRISOURCE FIBER PO PACK
1.0000 | PACK | Freq: Two times a day (BID) | ORAL | Status: DC
  Administered 2021-10-22: 1 via ORAL
  Filled 2021-10-19 (×6): qty 1

## 2021-10-19 MED ORDER — DIPHENHYDRAMINE HCL 25 MG PO TABS
25.0000 mg | ORAL_TABLET | Freq: Every day | ORAL | Status: DC | PRN
Start: 2021-10-19 — End: 2021-10-23

## 2021-10-19 MED ORDER — GABAPENTIN 600 MG PO TABS
600.0000 mg | ORAL_TABLET | Freq: Two times a day (BID) | ORAL | Status: DC
  Administered 2021-10-19 – 2021-10-23 (×8): 600 mg via ORAL
  Filled 2021-10-19 (×9): qty 1

## 2021-10-19 MED ORDER — PANTOPRAZOLE SODIUM 40 MG PO TBEC
40.0000 mg | DELAYED_RELEASE_TABLET | Freq: Every day | ORAL | Status: DC
  Administered 2021-10-19: 40 mg via ORAL
  Filled 2021-10-19 (×3): qty 1

## 2021-10-19 MED ORDER — GUAIFENESIN ER 600 MG PO TB12
600.0000 mg | ORAL_TABLET | Freq: Two times a day (BID) | ORAL | Status: DC
  Filled 2021-10-19 (×6): qty 1

## 2021-10-19 MED ORDER — ONDANSETRON HCL 4 MG PO TABS: 4.0000 mg | ORAL_TABLET | Freq: Three times a day (TID) | ORAL | Status: DC | PRN

## 2021-10-19 MED ORDER — DORZOLAMIDE HCL-TIMOLOL MAL 2-0.5 % OP SOLN
1.0000 [drp] | Freq: Two times a day (BID) | OPHTHALMIC | Status: DC
  Administered 2021-10-19 – 2021-10-23 (×8): 1 [drp] via OPHTHALMIC
  Filled 2021-10-19: qty 10

## 2021-10-19 MED ORDER — ATORVASTATIN CALCIUM 40 MG PO TABS
40.0000 mg | ORAL_TABLET | Freq: Every day | ORAL | Status: DC
  Administered 2021-10-19 – 2021-10-22 (×4): 40 mg via ORAL
  Filled 2021-10-19 (×4): qty 1

## 2021-10-19 NOTE — Evaluation (Signed)
Physical Therapy Evaluation Patient Details Name: MARON STANZIONE MRN: [id number] DOB: 03/20/1951 Today's Date: 10/19/2021  History of Present Illness  71 year old gentleman with a history of glaucoma, prostate cancer admitted with headache, drooping of eyelid and double vision.  Found to have acute 3rd nerve palsy with concern for possible DM related nerve palsy from taking too much steriods versus as a result of pituitary mass.  Plan for possible pituitary transsphenoidal, hypophysis ectomy.  Clinical Impression  Patient presents with mild balance deficits and new double vision and droopy eyelid due to 3rd nerve palsy.  He was able to mobilize functionally with S to occasional minguard assistance.  Patient educated in depth perception issues with step ups and need for wife to assist if in community.  Scored 18/24 or DGI demonstrating fall risk for community mobility.  PT will follow up as noted for procedure possibly early this week.  Likely no follow up PT needed at d/c.        Recommendations for follow up therapy are one component of a multi-disciplinary discharge planning process, led by the attending physician.  Recommendations may be updated based on patient status, additional functional criteria and insurance authorization.  Follow Up Recommendations No PT follow up    Assistance Recommended at Discharge Intermittent Supervision/Assistance  Patient can return home with the following  Help with stairs or ramp for entrance;Assistance with cooking/housework;Direct supervision/assist for medications management;Assist for transportation    Equipment Recommendations None recommended by PT  Recommendations for Other Services       Functional Status Assessment Patient has had a recent decline in their functional status and demonstrates the ability to make significant improvements in function in a reasonable and predictable amount of time.     Precautions / Restrictions  Precautions Precautions: Fall      Mobility  Bed Mobility Overal bed mobility: Modified Independent                  Transfers Overall transfer level: Modified independent                      Ambulation/Gait Ambulation/Gait assistance: Supervision, Min guard Gait Distance (Feet): 200 Feet Assistive device: None Gait Pattern/deviations: Step-through pattern, Decreased stride length, Drifts right/left       General Gait Details: mild imbalance with ambulation, pt feels due to plantar fasciitis; occasional minguard for direction/turns; see DGI  Stairs Stairs: Yes Stairs assistance: Supervision, Min guard Stair Management: One rail Right, Alternating pattern, Forwards Number of Stairs: 3 General stair comments: minguard when turning to come back down stairs and not holding Customer service manager    Modified Rankin (Stroke Patients Only)       Balance Overall balance assessment: Needs assistance   Sitting balance-Leahy Scale: Good       Standing balance-Leahy Scale: Good Standing balance comment: imbalance noted with quick turns and backing up, he reports feels his R plantar fasciitis affecting his balance as well                 Standardized Balance Assessment Standardized Balance Assessment : Dynamic Gait Index   Dynamic Gait Index Level Surface: Mild Impairment Change in Gait Speed: Moderate Impairment Gait with Horizontal Head Turns: Mild Impairment Gait with Vertical Head Turns: Normal Gait and Pivot Turn: Mild Impairment Step Over Obstacle: Normal Step Around Obstacles: Normal Steps: Mild Impairment Total Score: 18       Pertinent Vitals/Pain Pain Assessment Pain Assessment: No/denies pain  Home Living Family/patient expects to be discharged to:: Private residence Living Arrangements: Spouse/significant other Available Help at Discharge: Family Type of Home: House Home Access: Stairs to enter   Engineer, site of Steps: 1   Home Layout: One level [place]: Shower seat - built in;Grab bars - tub/shower Additional Comments: reports undergoing PT for R foot plantar fasciitis with plan for 2 more sessions.    Prior Function Prior Level of Function : Independent/Modified Independent               ADLs Comments: drives does household chores     Hand Dominance   Dominant Hand: Right    Extremity/Trunk Assessment   Upper Extremity Assessment Upper Extremity Assessment: RUE deficits/detail RUE Deficits / Details: limited shoulder flexion, reports h/o shoulder replacement surgery    Lower Extremity Assessment Lower Extremity Assessment: Overall WFL for tasks assessed    Cervical / Trunk Assessment Cervical / Trunk Assessment: Kyphotic  Communication   Communication: No difficulties  Cognition Arousal/Alertness: Awake/alert Behavior During Therapy: WFL for tasks assessed/performed Overall Cognitive Status: Within Functional Limits for tasks assessed                                          General Comments      Exercises     Assessment/Plan    PT Assessment Patient needs continued PT services  PT Problem List         PT Treatment Interventions Balance training;Gait training;Therapeutic exercise;Patient/family education    PT Goals (Current goals can be found in the Care Plan section)  Acute Rehab PT Goals Patient Stated Goal: to return to independent PT Goal Formulation: With patient/family Time For Goal Achievement: 10/26/21 Potential to Achieve Goals: Good    Frequency Min 3X/week     Co-evaluation               AM-PAC PT "6 Clicks" Mobility  Outcome Measure Help needed turning from your back to your side while in a flat bed without using bedrails?: None Help needed moving from lying on your back to sitting on the side of a flat bed without using bedrails?: None Help needed moving to and from a bed to a chair  (including a wheelchair)?: None Help needed standing up from a chair using your arms (e.g., wheelchair or bedside chair)?: None Help needed to walk in hospital room?: None Help needed climbing 3-5 steps with a railing? : A Little 6 Click Score: 23    End of Session Equipment Utilized During Treatment: Gait belt Activity Tolerance: Patient tolerated treatment well Patient left: in chair;with family/visitor present   PT Visit Diagnosis: Other abnormalities of gait and mobility (R26.89)    Time: 5176-1607 PT Time Calculation (min) (ACUTE ONLY): 15 min   Charges:   PT Evaluation $PT Eval Low Complexity: 1 Low          Magda Kiel, PT Acute Rehabilitation Services Office:[phone] 10/19/2021   Reginia Naas 10/19/2021, 11:33 AM

## 2021-10-19 NOTE — Evaluation (Signed)
Occupational Therapy Evaluation Patient Details Name: Kyle Wilcox MRN: [id number] DOB: 1950-06-03 Today's Date: 10/19/2021   History of Present Illness 71 year old gentleman with a history of glaucoma, prostate cancer admitted with headache, drooping of eyelid and double vision.  Found to have acute 3rd nerve palsy with concern for possible DM related nerve palsy from taking too much steriods versus as a result of pituitary mass.  Plan for possible pituitary transsphenoidal, hypophysis ectomy.   Clinical Impression   Kaiyon was evaluated s/p th above admission list, he is generally indep at baseline including driving and playing golf. He lives with his wife who is able to assist as needed. Upon evaluation pt was supervision A for all ADLs and functional mobility without AD. He states he has been indep completing Adls during his acute stay. Pt is limited by double vision, that improves with L eye occlusion. Provided pt with tape on L nasal aspect of his personal glasses and he reported improvement in diplopia. Educated pt and his wife on progressing the tape over time, they both verbalized understanding. Pt will benefit from OT acutely. Recommend follow up with vision specialist at d/c to better manage double vision.      Recommendations for follow up therapy are one component of a multi-disciplinary discharge planning process, led by the attending physician.  Recommendations may be updated based on patient status, additional functional criteria and insurance authorization.   Follow Up Recommendations  Outpatient OT (vision specialist)    Assistance Recommended at Discharge Intermittent Supervision/Assistance  Patient can return home with the following A little help with walking and/or transfers;A little help with bathing/dressing/bathroom;Assist for transportation;Assistance with cooking/housework    Functional Status Assessment  Patient has had a recent decline in their functional status and  demonstrates the ability to make significant improvements in function in a reasonable and predictable amount of time.  Equipment Recommendations  None recommended by OT    Recommendations for Other Services       Precautions / Restrictions Precautions Precautions: Fall Precaution Comments: DV Restrictions Weight Bearing Restrictions: No      Mobility Bed Mobility Overal bed mobility: Modified Independent             General bed mobility comments: OOB in chair upon arrival    Transfers Overall transfer level: Modified independent                        Balance Overall balance assessment: Needs assistance   Sitting balance-Leahy Scale: Good       Standing balance-Leahy Scale: Good                             ADL either performed or assessed with clinical judgement   ADL Overall ADL's : Needs assistance/impaired                                       General ADL Comments: Supervision A provided for all ADLs and funcitonal mobility, no AD needed. Occluded galsses for session with improvement in DV     Vision Baseline Vision/History: 1 Wears glasses Ability to See in Adequate Light: 0 Adequate Patient Visual Report: Diplopia Vision Assessment?: Vision impaired- to be further tested in functional context Additional Comments: double vision, dissappears with L eye occlusion.     Perception     Praxis  Pertinent Vitals/Pain Pain Assessment Pain Assessment: No/denies pain     Hand Dominance Right   Extremity/Trunk Assessment Upper Extremity Assessment Upper Extremity Assessment: Overall WFL for tasks assessed RUE Deficits / Details: limited shoulder flexion, reports h/o shoulder replacement surgery   Lower Extremity Assessment Lower Extremity Assessment: Defer to PT evaluation   Cervical / Trunk Assessment Cervical / Trunk Assessment: Kyphotic   Communication Communication Communication: No difficulties    Cognition Arousal/Alertness: Awake/alert Behavior During Therapy: WFL for tasks assessed/performed Overall Cognitive Status: Within Functional Limits for tasks assessed                                       General Comments  VSS on RA, wife present and supportive. Occluded pt with tape for glasses occlusion and porvided education on progression of tape    Exercises     Shoulder Instructions      Home Living Family/patient expects to be discharged to:: Private residence Living Arrangements: Spouse/significant other Available Help at Discharge: Family Type of Home: House Home Access: Stairs to enter Technical brewer of Steps: 1   Home Layout: One level     Bathroom Shower/Tub: Occupational psychologist: Handicapped height     [place]: [place] - built in;Grab bars - tub/shower   Additional Comments: reports undergoing PT for R foot plantar fascitis with plan for 2 more sessions.      Prior Functioning/Environment Prior Level of Function : Independent/Modified Independent               ADLs Comments: drives does household chores, loves to golf        OT Problem List: Impaired balance (sitting and/or standing);Impaired vision/perception      OT Treatment/Interventions: Therapeutic exercise;Visual/perceptual remediation/compensation;Self-care/ADL training    OT Goals(Current goals can be found in the care plan section) Acute Rehab OT Goals Patient Stated Goal: home OT Goal Formulation: With patient Time For Goal Achievement: 11/02/21 Potential to Achieve Goals: Good ADL Goals Pt Will Perform Lower Body Dressing: Independently;sit to/from stand Pt Will Transfer to Toilet: Independently;ambulating Additional ADL Goal #1: Pt will indep manage glasses occlusion strategy to improve diplopia during ADLs Additional ADL Goal #2: Pt will indep recall at least 3 fall prevention techniques to apply to the home setting  OT  Frequency: Min 2X/week    Co-evaluation              AM-PAC OT "6 Clicks" Daily Activity     Outcome Measure Help from another person eating meals?: None Help from another person taking care of personal grooming?: A Little Help from another person toileting, which includes using toliet, bedpan, or urinal?: A Little Help from another person bathing (including washing, rinsing, drying)?: A Little Help from another person to put on and taking off regular upper body clothing?: A Little Help from another person to put on and taking off regular lower body clothing?: A Little 6 Click Score: 19   End of Session Nurse Communication: Mobility status (glasses occluded)  Activity Tolerance: Patient tolerated treatment well Patient left: in chair;with call bell/phone within reach;with family/visitor present  OT Visit Diagnosis: Low vision, both eyes (H54.2)                Time: 1423-1440 OT Time Calculation (min): 17 min Charges:  OT General Charges $OT Visit: 1 Visit OT Evaluation $OT Eval Moderate Complexity: 1  Mod  Lorrinda Ramstad A Taeshaun Rames 10/19/2021, 2:47 PM

## 2021-10-19 NOTE — Progress Notes (Signed)
Subjective: Patient reports patient feels significantly better no headache feels like his eye is drooping less  Objective: Vital signs in last 24 hours: Temp:  [97.9 F (36.6 C)-98.8 F (37.1 C)] 98.8 F (37.1 C) (06/05 1648) Pulse Rate:  [48-111] 51 (06/05 1648) Resp:  [16] 16 (06/05 0315) BP: (137-172)/(68-83) 137/83 (06/05 1648) SpO2:  [89 %-97 %] 97 % (06/05 1648)  Intake/Output from previous day: 06/04 0701 - 06/05 0700 In: -  Out: 900 [Urine:900] Intake/Output this shift: No intake/output data recorded.  Still with left complete 3rd nerve palsy but otherwise neurologically nonfocal and intact.  Lab Results: Recent Labs    10/17/21 1235 10/18/21 0813  WBC 17.8* 14.5*  HGB 16.0 15.7  HCT 46.7 45.7  PLT 232 187   BMET Recent Labs    10/17/21 1056 10/18/21 0813  NA 131* 138  K 4.1 3.0*  CL 94* 96*  CO2 26 32  GLUCOSE 191* 108*  BUN 10 6*  CREATININE 0.91 0.95  CALCIUM 9.5 9.0    Studies/Results: MR BRAIN W CONTRAST  Result Date: 10/17/2021 CLINICAL DATA:  Follow-up examination for abnormal brain MRI. EXAM: MRI HEAD WITH CONTRAST TECHNIQUE: Multiplanar, multiecho pulse sequences of the brain and surrounding structures were obtained with intravenous contrast. CONTRAST:  89m GADAVIST GADOBUTROL 1 MMOL/ML IV SOLN COMPARISON:  Previous brain MRI from earlier the same day. FINDINGS: Limited postcontrast thin section imaging through the sella and pituitary gland was performed. Pituitary gland is again seen to be abnormally enlarged and heterogeneous measuring approximately 1.5 x 1.2 x 1.9 cm. No visible internal fluid-fluid level. Suprasellar extension to abut the undersurface of the optic chiasm. Pituitary stalk appears slightly deviated to the right. No visible invasion of the adjacent cavernous sinus. Normal flow voids are preserved within the adjacent cavernous ICAs. Unclear whether there is actual enhancement, as this finding does demonstrate intrinsic precontrast T1  hyperintensity on prior brain MRI. IMPRESSION: Abnormally enlarged and heterogeneous appearance of the pituitary gland. Differential considerations remain the same, with possibilities including a complex Rathke's cleft cyst, pituitary macro adenoma, or pituitary apoplexy. Electronically Signed   By: BJeannine BogaM.D.   On: 10/17/2021 22:14   CT MAXILLOFACIAL WO CONTRAST  Result Date: 10/19/2021 CLINICAL DATA:  Preoperative evaluation for pituitary gland removal. EXAM: CT MAXILLOFACIAL WITHOUT CONTRAST TECHNIQUE: Multidetector CT imaging of the maxillofacial structures was performed. Multiplanar CT image reconstructions were also generated. RADIATION DOSE REDUCTION: This exam was performed according to the departmental dose-optimization program which includes automated exposure control, adjustment of the mA and/or kV according to patient size and/or use of iterative reconstruction technique. COMPARISON:  MRI studies done 10/17/2021 FINDINGS: Osseous: No regional osseous abnormality. Orbits: Normal Sinuses: Frontal, ethmoid and maxillary sinuses are clear. Mild mucosal thickening along the posterior aspect of the dominant left sphenoid sinus division. Right sphenoid sinus division is diminutive. Mild S-shaped curvature of the nasal septum. Soft tissues: Otherwise negative Limited intracranial: See results of prior brain imaging. IMPRESSION: Dominant left division of the sphenoid sinus. Hypoplastic right division. Mild mucosal thickening along the posterior aspect of the dominant left division. Other paranasal sinuses are clear. Mild S shaped curvature of the nasal septum. Electronically Signed   By: MNelson ChimesM.D.   On: 10/19/2021 17:59    Assessment/Plan: Extensively talked with Dr. SClaiborne Riggwith ENT and Dr. CChristella Noawith neurosurgery who will be handling his transfer neurohypophysectomy continue steroids for now.  LOS: 1 day     GElaina Hoops6/09/2021, 6:11 PM

## 2021-10-20 ENCOUNTER — Inpatient Hospital Stay (HOSPITAL_COMMUNITY)
Admission: EM | Disposition: A | Discharge status: Home / Self Care | Payer: Self-pay | Source: Home / Self Care | Attending: Neurosurgery

## 2021-10-20 ENCOUNTER — Inpatient Hospital Stay (HOSPITAL_COMMUNITY): Payer: Medicare Other | Admitting: Certified Registered"

## 2021-10-20 ENCOUNTER — Other Ambulatory Visit: Payer: Self-pay

## 2021-10-20 DIAGNOSIS — D497 Neoplasm of unspecified behavior of endocrine glands and other parts of nervous system: Secondary | ICD-10-CM | POA: Diagnosis present

## 2021-10-20 DIAGNOSIS — D352 Benign neoplasm of pituitary gland: Secondary | ICD-10-CM | POA: Diagnosis not present

## 2021-10-20 HISTORY — PX: TRANSPHENOIDAL APPROACH EXPOSURE: SHX6311

## 2021-10-20 HISTORY — PX: CRANIOTOMY: SHX93

## 2021-10-20 LAB — BASIC METABOLIC PANEL
Anion gap: 5 (ref 5–15)
BUN: 22 mg/dL (ref 8–23)
CO2: 29 mmol/L (ref 22–32)
Calcium: 9.3 mg/dL (ref 8.9–10.3)
Chloride: 107 mmol/L (ref 98–111)
Creatinine, Ser: 0.9 mg/dL (ref 0.61–1.24)
GFR, Estimated: >60 mL/min (ref >60)
Glucose, Bld: 165 mg/dL — ABNORMAL HIGH (ref 70–99)
Potassium: 4.2 mmol/L (ref 3.5–5.1)
Sodium: 141 mmol/L (ref 135–145)

## 2021-10-20 LAB — TYPE AND SCREEN
ABO/RH(D): A POS
Antibody Screen: NEGATIVE

## 2021-10-20 LAB — GLUCOSE, CAPILLARY
Glucose-Capillary: 148 mg/dL — ABNORMAL HIGH (ref 70–99)
Glucose-Capillary: 192 mg/dL — ABNORMAL HIGH (ref 70–99)

## 2021-10-20 LAB — GROWTH HORMONE: Growth Hormone: 0.9 ng/mL (ref 0.0–10.0)

## 2021-10-20 LAB — PROLACTIN: Prolactin: 2.3 ng/mL — ABNORMAL LOW (ref 4.0–15.2)

## 2021-10-20 SURGERY — CRANIOTOMY HYPOPHYSECTOMY TRANSNASAL APPROACH
Anesthesia: General | Site: Head

## 2021-10-20 MED ORDER — THROMBIN 5000 UNITS EX SOLR
OROMUCOSAL | Status: DC | PRN
  Administered 2021-10-20: 5 mL via TOPICAL

## 2021-10-20 MED ORDER — EPHEDRINE SULFATE-NACL 50-0.9 MG/10ML-% IV SOSY
PREFILLED_SYRINGE | INTRAVENOUS | Status: DC | PRN
  Administered 2021-10-20: 5 mg via INTRAVENOUS

## 2021-10-20 MED ORDER — CHLORHEXIDINE GLUCONATE CLOTH 2 % EX PADS
6.0000 | MEDICATED_PAD | Freq: Every day | CUTANEOUS | Status: DC
  Administered 2021-10-20 – 2021-10-21 (×2): 6 via TOPICAL

## 2021-10-20 MED ORDER — FENTANYL CITRATE (PF) 100 MCG/2ML IJ SOLN: 25.0000 ug | INTRAMUSCULAR | Status: DC | PRN

## 2021-10-20 MED ORDER — ONDANSETRON HCL 4 MG/2ML IJ SOLN
INTRAMUSCULAR | Status: AC
  Filled 2021-10-20: qty 2

## 2021-10-20 MED ORDER — ROCURONIUM BROMIDE 10 MG/ML (PF) SYRINGE
PREFILLED_SYRINGE | INTRAVENOUS | Status: AC
  Filled 2021-10-20: qty 20

## 2021-10-20 MED ORDER — AMISULPRIDE (ANTIEMETIC) 5 MG/2ML IV SOLN: 10.0000 mg | Freq: Once | INTRAVENOUS | Status: DC | PRN

## 2021-10-20 MED ORDER — SODIUM CHLORIDE 0.9 % IV SOLN
0.1500 ug/kg/min | INTRAVENOUS | Status: AC
  Administered 2021-10-20: .1 ug/kg/min via INTRAVENOUS
  Filled 2021-10-20: qty 2000

## 2021-10-20 MED ORDER — LIDOCAINE-EPINEPHRINE 1 %-1:100000 IJ SOLN
INTRAMUSCULAR | Status: AC
  Filled 2021-10-20: qty 1

## 2021-10-20 MED ORDER — OXYMETAZOLINE HCL 0.05 % NA SOLN
NASAL | Status: DC | PRN
  Administered 2021-10-20: 1

## 2021-10-20 MED ORDER — FENTANYL CITRATE (PF) 250 MCG/5ML IJ SOLN
INTRAMUSCULAR | Status: AC
  Filled 2021-10-20: qty 5

## 2021-10-20 MED ORDER — PHENYLEPHRINE 80 MCG/ML (10ML) SYRINGE FOR IV PUSH (FOR BLOOD PRESSURE SUPPORT)
PREFILLED_SYRINGE | INTRAVENOUS | Status: AC
  Filled 2021-10-20: qty 20

## 2021-10-20 MED ORDER — ORAL CARE MOUTH RINSE
15.0000 mL | Freq: Once | OROMUCOSAL | Status: AC
Start: 2021-10-20 — End: 2021-10-20

## 2021-10-20 MED ORDER — CEFAZOLIN SODIUM 1 G IJ SOLR
INTRAMUSCULAR | Status: AC
Start: 2021-10-20 — End: ?
  Filled 2021-10-20: qty 30

## 2021-10-20 MED ORDER — PROPOFOL 10 MG/ML IV BOLUS
INTRAVENOUS | Status: AC
  Filled 2021-10-20: qty 20

## 2021-10-20 MED ORDER — SODIUM CHLORIDE 0.9 % IV SOLN: INTRAVENOUS | Status: DC

## 2021-10-20 MED ORDER — HYDROCORTISONE SOD SUC (PF) 250 MG IJ SOLR
INTRAMUSCULAR | Status: AC
  Filled 2021-10-20: qty 250

## 2021-10-20 MED ORDER — THROMBIN 5000 UNITS EX SOLR
CUTANEOUS | Status: AC
  Filled 2021-10-20: qty 5000

## 2021-10-20 MED ORDER — OXYMETAZOLINE HCL 0.05 % NA SOLN
NASAL | Status: AC
  Filled 2021-10-20: qty 30

## 2021-10-20 MED ORDER — PROPOFOL 10 MG/ML IV BOLUS
INTRAVENOUS | Status: AC
Start: 2021-10-20 — End: ?
  Filled 2021-10-20: qty 20

## 2021-10-20 MED ORDER — HYDROCORTISONE SOD SUC (PF) 1000 MG IJ SOLR
INTRAMUSCULAR | Status: DC | PRN
  Administered 2021-10-20: 100 mg via INTRAVENOUS

## 2021-10-20 MED ORDER — SUGAMMADEX SODIUM 200 MG/2ML IV SOLN
INTRAVENOUS | Status: DC | PRN
  Administered 2021-10-20: 150 mg via INTRAVENOUS
  Administered 2021-10-20: 200 mg via INTRAVENOUS

## 2021-10-20 MED ORDER — GLYCOPYRROLATE PF 0.2 MG/ML IJ SOSY
PREFILLED_SYRINGE | INTRAMUSCULAR | Status: DC | PRN
  Administered 2021-10-20: .2 mg via INTRAVENOUS

## 2021-10-20 MED ORDER — CEPHALEXIN 500 MG PO CAPS: 500.0000 mg | ORAL_CAPSULE | Freq: Three times a day (TID) | ORAL | Status: DC

## 2021-10-20 MED ORDER — ROCURONIUM BROMIDE 10 MG/ML (PF) SYRINGE
PREFILLED_SYRINGE | INTRAVENOUS | Status: DC | PRN
  Administered 2021-10-20 (×2): 20 mg via INTRAVENOUS
  Administered 2021-10-20: 60 mg via INTRAVENOUS

## 2021-10-20 MED ORDER — EPHEDRINE 5 MG/ML INJ
INTRAVENOUS | Status: AC
Start: 2021-10-20 — End: ?
  Filled 2021-10-20: qty 5

## 2021-10-20 MED ORDER — LACTATED RINGERS IV SOLN: INTRAVENOUS | Status: DC

## 2021-10-20 MED ORDER — THROMBIN 20000 UNITS EX SOLR
CUTANEOUS | Status: AC
  Filled 2021-10-20: qty 20000

## 2021-10-20 MED ORDER — THROMBIN (RECOMBINANT) 5000 UNITS EX SOLR
CUTANEOUS | Status: DC | PRN
  Administered 2021-10-20: 10 mL via TOPICAL

## 2021-10-20 MED ORDER — LIDOCAINE-EPINEPHRINE 0.5 %-1:200000 IJ SOLN
INTRAMUSCULAR | Status: AC
Start: 2021-10-20 — End: ?
  Filled 2021-10-20: qty 1

## 2021-10-20 MED ORDER — PHENYLEPHRINE HCL-NACL 20-0.9 MG/250ML-% IV SOLN
INTRAVENOUS | Status: DC | PRN
  Administered 2021-10-20: 20 ug/min via INTRAVENOUS

## 2021-10-20 MED ORDER — CEPHALEXIN 500 MG PO CAPS: 500.0000 mg | ORAL_CAPSULE | Freq: Three times a day (TID) | ORAL | 0 refills | Status: AC

## 2021-10-20 MED ORDER — CHLORHEXIDINE GLUCONATE 0.12 % MT SOLN
15.0000 mL | Freq: Once | OROMUCOSAL | Status: AC
Start: 2021-10-20 — End: 2021-10-20
  Administered 2021-10-20: 15 mL via OROMUCOSAL

## 2021-10-20 MED ORDER — FENTANYL CITRATE (PF) 250 MCG/5ML IJ SOLN
INTRAMUSCULAR | Status: DC | PRN
  Administered 2021-10-20 (×2): 50 ug via INTRAVENOUS

## 2021-10-20 MED ORDER — 0.9 % SODIUM CHLORIDE (POUR BTL) OPTIME
TOPICAL | Status: DC | PRN
  Administered 2021-10-20: 1000 mL

## 2021-10-20 MED ORDER — CEFAZOLIN SODIUM-DEXTROSE 2-4 GM/100ML-% IV SOLN
2.0000 g | Freq: Once | INTRAVENOUS | Status: AC
  Administered 2021-10-20: 2 g via INTRAVENOUS

## 2021-10-20 MED ORDER — SODIUM CHLORIDE 0.9 % IV SOLN: INTRAVENOUS | Status: DC | PRN

## 2021-10-20 MED ORDER — LIDOCAINE-EPINEPHRINE 1 %-1:100000 IJ SOLN
INTRAMUSCULAR | Status: DC | PRN
  Administered 2021-10-20: 8 mL

## 2021-10-20 MED ORDER — CEPHALEXIN 500 MG PO CAPS
500.0000 mg | ORAL_CAPSULE | Freq: Three times a day (TID) | ORAL | Status: DC
  Administered 2021-10-20 – 2021-10-21 (×4): 500 mg via ORAL
  Filled 2021-10-20 (×5): qty 1

## 2021-10-20 MED ORDER — PROPOFOL 10 MG/ML IV BOLUS
INTRAVENOUS | Status: DC | PRN
  Administered 2021-10-20: 50 mg via INTRAVENOUS
  Administered 2021-10-20: 150 mg via INTRAVENOUS

## 2021-10-20 MED ORDER — HEMOSTATIC AGENTS (NO CHARGE) OPTIME
TOPICAL | Status: DC | PRN
  Administered 2021-10-20: 1 via TOPICAL

## 2021-10-20 MED ORDER — ACETAMINOPHEN 500 MG PO TABS
1000.0000 mg | ORAL_TABLET | Freq: Once | ORAL | Status: AC
  Administered 2021-10-20: 1000 mg via ORAL

## 2021-10-20 MED ORDER — LABETALOL HCL 5 MG/ML IV SOLN
INTRAVENOUS | Status: AC
  Filled 2021-10-20: qty 4

## 2021-10-20 MED ORDER — CHLORHEXIDINE GLUCONATE 0.12 % MT SOLN
OROMUCOSAL | Status: AC
  Filled 2021-10-20: qty 15

## 2021-10-20 MED ORDER — LABETALOL HCL 5 MG/ML IV SOLN
5.0000 mg | INTRAVENOUS | Status: DC | PRN
  Administered 2021-10-20: 20 mg via INTRAVENOUS
  Filled 2021-10-20: qty 4

## 2021-10-20 MED ORDER — TRIAMCINOLONE ACETONIDE 40 MG/ML IJ SUSP
INTRAMUSCULAR | Status: AC
  Filled 2021-10-20: qty 5

## 2021-10-20 MED ORDER — HYDRALAZINE HCL 20 MG/ML IJ SOLN
10.0000 mg | Freq: Once | INTRAMUSCULAR | Status: AC
  Administered 2021-10-20: 10 mg via INTRAVENOUS

## 2021-10-20 MED ORDER — LIDOCAINE 2% (20 MG/ML) 5 ML SYRINGE
INTRAMUSCULAR | Status: DC | PRN
  Administered 2021-10-20: 100 mg via INTRAVENOUS

## 2021-10-20 MED ORDER — HYDROCORTISONE 20 MG PO TABS
50.0000 mg | ORAL_TABLET | Freq: Three times a day (TID) | ORAL | Status: DC
  Administered 2021-10-20 – 2021-10-21 (×5): 50 mg via ORAL
  Filled 2021-10-20 (×6): qty 1

## 2021-10-20 MED ORDER — ONDANSETRON HCL 4 MG/2ML IJ SOLN
INTRAMUSCULAR | Status: DC | PRN
  Administered 2021-10-20: 4 mg via INTRAVENOUS

## 2021-10-20 MED ORDER — ACETAMINOPHEN 500 MG PO TABS
ORAL_TABLET | ORAL | Status: AC
  Filled 2021-10-20: qty 2

## 2021-10-20 MED ORDER — SODIUM CHLORIDE 0.9 % IR SOLN
Status: DC | PRN
  Administered 2021-10-20 (×2): 1000 mL

## 2021-10-20 MED ORDER — HYDRALAZINE HCL 20 MG/ML IJ SOLN
INTRAMUSCULAR | Status: AC
  Filled 2021-10-20: qty 1

## 2021-10-20 MED ORDER — ACETAMINOPHEN 500 MG PO TABS
ORAL_TABLET | ORAL | Status: AC
  Filled 2021-10-20: qty 1

## 2021-10-20 MED ORDER — MUPIROCIN 2 % EX OINT
TOPICAL_OINTMENT | CUTANEOUS | Status: AC
  Filled 2021-10-20: qty 22

## 2021-10-20 MED ORDER — SALINE SPRAY 0.65 % NA SOLN
4.0000 | NASAL | Status: DC | PRN
  Administered 2021-10-21 – 2021-10-23 (×8): 4 via NASAL
  Filled 2021-10-20: qty 44

## 2021-10-20 SURGICAL SUPPLY — 85 items
BAG COUNTER SPONGE SURGICOUNT (BAG) ×4 IMPLANT
BAND RUBBER #18 3X1/16 STRL (MISCELLANEOUS) IMPLANT
BENZOIN TINCTURE PRP APPL 2/3 (GAUZE/BANDAGES/DRESSINGS) IMPLANT
BLADE EYE SICKLE 84 5 BEAV (BLADE) IMPLANT
BLADE ROTATE TRICUT 4X13 M4 (BLADE) ×2 IMPLANT
BUR MATCHSTICK NEURO 3.0 LAGG (BURR) IMPLANT
BUR TAPER CHOANAL ATRESIA 30K (BURR) ×1 IMPLANT
CANISTER SUCT 3000ML PPV (MISCELLANEOUS) ×4 IMPLANT
CARTRIDGE OIL MAESTRO DRILL (MISCELLANEOUS) ×1 IMPLANT
CATH ROBINSON RED A/P 14FR (CATHETERS) IMPLANT
COTTONBALL LRG STERILE PKG (GAUZE/BANDAGES/DRESSINGS) IMPLANT
DECANTER SPIKE VIAL GLASS SM (MISCELLANEOUS) ×2 IMPLANT
DIFFUSER DRILL AIR PNEUMATIC (MISCELLANEOUS) ×1 IMPLANT
DRAIN SUBARACHNOID (WOUND CARE) IMPLANT
DRAPE C-ARM 42X72 X-RAY (DRAPES) IMPLANT
DRAPE MICROSCOPE LEICA (MISCELLANEOUS) IMPLANT
DURAPREP 26ML APPLICATOR (WOUND CARE) ×2 IMPLANT
ELECT COATED BLADE 2.86 ST (ELECTRODE) IMPLANT
ELECT NDL TIP 2.8 STRL (NEEDLE) IMPLANT
ELECT NEEDLE TIP 2.8 STRL (NEEDLE) IMPLANT
ELECT REM PT RETURN 9FT ADLT (ELECTROSURGICAL) ×4
ELECTRODE REM PT RTRN 9FT ADLT (ELECTROSURGICAL) ×2 IMPLANT
GAUZE PACKING FOLDED 2  STR (GAUZE/BANDAGES/DRESSINGS)
GAUZE PACKING FOLDED 2 STR (GAUZE/BANDAGES/DRESSINGS) IMPLANT
GAUZE SPONGE 4X4 12PLY STRL (GAUZE/BANDAGES/DRESSINGS) ×1 IMPLANT
GLOVE BIOGEL M 7.0 STRL (GLOVE) ×4 IMPLANT
GLOVE ECLIPSE 6.5 STRL STRAW (GLOVE) ×2 IMPLANT
GLOVE EXAM NITRILE XL STR (GLOVE) IMPLANT
GOWN STRL REUS W/ TWL LRG LVL3 (GOWN DISPOSABLE) ×4 IMPLANT
GOWN STRL REUS W/ TWL XL LVL3 (GOWN DISPOSABLE) IMPLANT
GOWN STRL REUS W/TWL 2XL LVL3 (GOWN DISPOSABLE) IMPLANT
GOWN STRL REUS W/TWL LRG LVL3 (GOWN DISPOSABLE) ×4
GOWN STRL REUS W/TWL XL LVL3 (GOWN DISPOSABLE) ×2
GRAFT DURAGEN MATRIX 1WX1L (Tissue) ×1 IMPLANT
HEMOSTAT POWDER KIT SURGIFOAM (HEMOSTASIS) ×1 IMPLANT
HEMOSTAT SURGICEL 2X14 (HEMOSTASIS) ×1 IMPLANT
KIT BASIN OR (CUSTOM PROCEDURE TRAY) ×4 IMPLANT
KIT TURNOVER KIT B (KITS) ×4 IMPLANT
NDL HYPO 25GX1X1/2 BEV (NEEDLE) ×1 IMPLANT
NDL HYPO 25X1 1.5 SAFETY (NEEDLE) ×1 IMPLANT
NDL SPNL 22GX3.5 QUINCKE BK (NEEDLE) ×1 IMPLANT
NDL SPNL 25GX3.5 QUINCKE BL (NEEDLE) ×1 IMPLANT
NEEDLE HYPO 25GX1X1/2 BEV (NEEDLE) ×2 IMPLANT
NEEDLE HYPO 25X1 1.5 SAFETY (NEEDLE) ×2 IMPLANT
NEEDLE SPNL 22GX3.5 QUINCKE BK (NEEDLE) ×2 IMPLANT
NEEDLE SPNL 25GX3.5 QUINCKE BL (NEEDLE) ×2 IMPLANT
NS IRRIG 1000ML POUR BTL (IV SOLUTION) ×4 IMPLANT
OIL CARTRIDGE MAESTRO DRILL (MISCELLANEOUS)
PACK LAMINECTOMY NEURO (CUSTOM PROCEDURE TRAY) ×2 IMPLANT
PAD ARMBOARD 7.5X6 YLW CONV (MISCELLANEOUS) ×6 IMPLANT
PATTIES SURGICAL .25X.25 (GAUZE/BANDAGES/DRESSINGS) IMPLANT
PATTIES SURGICAL .5 X3 (DISPOSABLE) ×1 IMPLANT
SHEATH ENDOSCRUB 0 DEG (SHEATH) ×2 IMPLANT
SPECIMEN JAR SMALL (MISCELLANEOUS) ×2 IMPLANT
SPLINT NASAL POSISEP X2 .8X2.3 (GAUZE/BANDAGES/DRESSINGS) ×1 IMPLANT
SPONGE NEURO XRAY DETECT 1X3 (DISPOSABLE) ×2 IMPLANT
SPONGE SURGIFOAM ABS GEL SZ50 (HEMOSTASIS) ×1 IMPLANT
SPONGE T-LAP 4X18 ~~LOC~~+RFID (SPONGE) ×1 IMPLANT
STAPLER SKIN PROX WIDE 3.9 (STAPLE) IMPLANT
STRIP CLOSURE SKIN 1/2X4 (GAUZE/BANDAGES/DRESSINGS) IMPLANT
SUT 5.0 PDS RB-1 (SUTURE)
SUT ETHILON 3 0 FSL (SUTURE) IMPLANT
SUT ETHILON 3 0 PS 1 (SUTURE) IMPLANT
SUT ETHILON 6 0 P 1 (SUTURE) IMPLANT
SUT PDS AB 4-0 RB1 27 (SUTURE) IMPLANT
SUT PDS PLUS AB 5-0 RB-1 (SUTURE) IMPLANT
SUT PLAIN 4 0 ~~LOC~~ 1 (SUTURE) ×1 IMPLANT
SUT VIC AB 2-0 CT1 27 (SUTURE)
SUT VIC AB 2-0 CT1 27XBRD (SUTURE) IMPLANT
SUT VIC AB 2-0 CT2 18 VCP726D (SUTURE) IMPLANT
SUT VIC AB 3-0 SH 8-18 (SUTURE) ×1 IMPLANT
SUT VIC AB 4-0 P-3 18X BRD (SUTURE) IMPLANT
SUT VIC AB 4-0 P3 18 (SUTURE)
SYR 5ML LL (SYRINGE) IMPLANT
TOWEL GREEN STERILE (TOWEL DISPOSABLE) ×2 IMPLANT
TOWEL GREEN STERILE FF (TOWEL DISPOSABLE) ×4 IMPLANT
TRACKER ENT INSTRUMENT (MISCELLANEOUS) ×2 IMPLANT
TRACKER ENT PATIENT (MISCELLANEOUS) ×2 IMPLANT
TRAY ENT MC OR (CUSTOM PROCEDURE TRAY) ×3 IMPLANT
TRAY FOLEY MTR SLVR 16FR STAT (SET/KITS/TRAYS/PACK) ×2 IMPLANT
TUBE CONNECTING 12X1/4 (SUCTIONS) ×2 IMPLANT
TUBING EXTENTION W/L.L. (IV SETS) ×2 IMPLANT
TUBING STRAIGHTSHOT EPS 5PK (TUBING) ×2 IMPLANT
UNDERPAD 30X36 HEAVY ABSORB (UNDERPADS AND DIAPERS) ×1 IMPLANT
WATER STERILE IRR 1000ML POUR (IV SOLUTION) ×4 IMPLANT

## 2021-10-20 NOTE — Progress Notes (Signed)
BP (!) 157/64   Pulse (!) 45   Temp 97.6 F (36.4 C) (Oral)   Resp 16   Ht '5\' 8"'$  (1.727 m)   Wt 80.3 kg   SpO2 97%   BMI 26.92 kg/m  Alert and oriented x 4, speech is clear and fluent Moving all extremities well Left lllrd nerve palsy, diplopia Symmetric facial movements No drift OR for tumor resection

## 2021-10-20 NOTE — Anesthesia Procedure Notes (Signed)
Arterial Line Insertion Start/End6/10/2021 12:40 PM, 10/20/2021 12:55 PM Performed by: Lavell Luster, CRNA, CRNA  Preanesthetic checklist: patient identified, IV checked, site marked, risks and benefits discussed, surgical consent, monitors and equipment checked, pre-op evaluation, timeout performed and anesthesia consent Right, radial was placed Catheter size: 20 G Hand hygiene performed  and maximum sterile barriers used  Allen's test indicative of satisfactory collateral circulation Attempts: 2 Procedure performed without using ultrasound guided technique. Following insertion, dressing applied and Biopatch. Post procedure assessment: normal  Patient tolerated the procedure well with no immediate complications.

## 2021-10-20 NOTE — Op Note (Signed)
Operative Note:  ENDOSCOPIC TRANSSPHENOIDAL PITUITARY RESECTION WITH NAVIGATION      Patient: Kyle Wilcox record number: [id number]  Date:10/20/2021  Pre-operative Indications: 1.  Pituitary Mass       Postoperative Indications: Same  Surgical Procedure: 1. Endoscopic transsphenoidal pituitary resection with intraoperative navigation          Anesthesia: GET  Surgeon: Kyle Wilcox, M.D.  Neurosurgeon: Ashok Pall MD  Complications: None  EBL: 100 cc  Findings: Deviated nasal septum but patent nasal passageway.  Hemorrhagic appearing pituitary mass.  Absorbable sphenoid packing placed.  Note: The neurosurgical component of the operative procedure is dictated as a separate operative note.   Brief History: The patient is a 71 y.o. male with a history of pituitary mass. The patient has a history of severe headache and left eyelid droop with 3rd nerve palsy.  Patient admitted to [facility] for evaluation and MRI scan showed heterogeneous pituitary mass consistent with possible hemorrhage.  Patient admitted to the neurosurgical service and treated with high-dose steroids with improvement in symptoms of swelling and headache.  Given the acute nature of the patient's symptoms urgent intervention was recommended including resection of the pituitary mass by Dr. Christella Noa via endoscopic transsphenoidal approach.  Given the patient's history and findings, the above surgical procedures were recommended, risks and benefits were discussed in detail with the patient.  They understand and agree with our plan for surgery which is scheduled at [facility] under general anesthesia.  Surgical Procedure: The patient is brought to the neurosurgical operating room on 10/20/2021 and placed in supine position on the operating table. General endotracheal anesthesia was established without difficulty. When the patient was adequately anesthetized, surgical timeout was performed with  correct identification of the patient and the surgical procedure. The patient's nose was then injected with 8 cc of 1% lidocaine 1:100,000 dilution epinephrine which was injected in a submucosal fashion. The patient's nose was then packed with Afrin-soaked cottonoid pledgets were left in place for approximately 10 minutes to allow for vasoconstriction and hemostasis.  The Xomed Fusion navigation headgear was applied and anatomic and surgical landmarks were identified and confirmed, navigation was used throughout the sinus component of the surgical procedure.  With the patient prepped draped and prepared for surgery, nasal endoscopy was performed on the patient's right.  The middle turbinate was carefully lateralized to allow access to the posterior aspect of the nasal passageway.  The right sphenoid sinus ostium was identified.  The inferior aspect of the superior turbinate was then resected with through-cutting forceps and a microdebrider.  The right sphenoid sinus ostium was enlarged in a superior and lateral direction using the microdebrider and through-cutting forceps to create a widely patent ostium.  Nasal endoscopy on the patient's left-hand side was then undertaken.  The left middle turbinate was lateralized and the posterior nasal cavity was visualized with identification of the left sphenoid sinus ostium using navigation.  The inferior aspect of the superior turbinate was resected and the sinus ostium was enlarged in the lateral and superior direction to create a wide sphenoid sinus ostium.  A posterior septectomy was then performed with a Surveyor, quantity.  Bone, cartilage and soft tissue was then resected to create a wide posterior septotomy.  The anterior face of the sphenoid sinus and sphenoid sinus septum were then resected with a combination of through-cutting forceps, osteotome and microdebrider to allow direct access to the entire posterior aspect of the sphenoid sinus and pituitary fossa.  Sphenoid sinus mucosa overlying the pituitary fossa was elevated and lateralized.  The anterior face of the pituitary fossa was demarcated using navigation.  With adequate access to the pituitary fossa the neurosurgical component of the procedure was begun by Dr. Christella Noa.  This is dictated as a separate operative report.  Resection of the pituitary tumor was undertaken using direct visualization of the 0 degree endoscope, navigation and blunt and sharp dissection.  With pituitary tumor resection completed, reconstruction was undertaken.  DuraGen placement within the sellar defect.  Surgicel was placed over the pituitary fossa defect and the sphenoid sinus was loosely packed with absorbable nasal packing.  There was no active bleeding and no evidence of spinal fluid leak.  The sphenoid sinus was carefully inspected, no further bleeding along the mucosal margins, sphenoidotomy sites or posterior septectomy.  The patient's nasal cavity was irrigated and suctioned.  Surgical sponge count was correct. An oral gastric tube was passed and the stomach contents were aspirated. Patient was awakened from anesthetic and transferred from the operating room to the recovery room in stable condition. There were no complications and blood loss was 100 cc.   Kyle Wilcox, M.D. [facility] ENT 10/20/2021

## 2021-10-20 NOTE — Anesthesia Procedure Notes (Signed)
Procedure Name: Intubation Date/Time: 10/20/2021 1:17 PM Performed by: Gaylene Brooks, CRNA Pre-anesthesia Checklist: Patient identified, Emergency Drugs available, Suction available and Patient being monitored Patient Re-evaluated:Patient Re-evaluated prior to induction Oxygen Delivery Method: Circle System Utilized Preoxygenation: Pre-oxygenation with 100% oxygen Induction Type: IV induction Ventilation: Mask ventilation without difficulty and Oral airway inserted - appropriate to patient size Laryngoscope Size: Sabra Heck and 3 Grade View: Grade II Tube type: Oral Tube size: 7.5 mm Number of attempts: 1 Airway Equipment and Method: Stylet and Oral airway Placement Confirmation: ETT inserted through vocal cords under direct vision, positive ETCO2 and breath sounds checked- equal and bilateral Secured at: 23 cm Tube secured with: Tape Dental Injury: Teeth and Oropharynx as per pre-operative assessment

## 2021-10-20 NOTE — Op Note (Addendum)
10/20/2021  4:27 PM  PATIENT:  Kyle Wilcox  71 y.o. male With new onset left lllrd nerve palsy and a hemorrhagic pituitary mass PRE-OPERATIVE DIAGNOSIS:  Pituitary adenoma  POST-OPERATIVE DIAGNOSIS:  Pituitary adenoma  PROCEDURE:  Procedure(s): Endoscopic transphenoidal resection of pituitary tumor TRANSPHENOIDAL APPROACH EXPOSURE  SURGEON: Surgeon(s): Ashok Pall, MD Jerrell Belfast, MD  ASSISTANTS:non  ANESTHESIA:  general endtotracheal  EBL:  Total I/O In: 1200 [I.V.:1200] Out: 600 [Urine:500; Blood:100]  BLOOD ADMINISTERED:none  CELL SAVER GIVEN:not applicable  COUNT:correct per nursing  DRAINS: none   SPECIMEN:  Source of Specimen:  pituitary gland  DICTATION: Kyle Wilcox was taken to the operating room, intubated, and placed under general anesthesia without difficulty. He was positioned supine . Under separate cover his approach and closure will be dictated by Dr. Jerrell Belfast under separate cover. Once the sphenoid sinus was entered Dr. Wilburn Cornelia called and I scrubbed into the case. I used a chisel to remove the front wall of the sella turcica. I cauterized the dura then opened the dura with an 11 blade. Some of the mass extruded from the opening under pressure. I used curettes to empty and remove remaining tissue. The material was hemorrhagic and came out easily. There was some viable appearing tissue that I left behind. I sent specimen to pathology for diagnosis. Prior to closing the diaphragma was pulsatile. I placed some duragen over the opening then left Dr. Wilburn Cornelia to close. Post op the patient was extubated and brought to pacu in stable condition.   PLAN OF CARE: Admit to inpatient   PATIENT DISPOSITION:  PACU - hemodynamically stable.   Delay start of Pharmacological VTE agent (>24hrs) due to surgical blood loss or risk of bleeding:  no

## 2021-10-20 NOTE — Discharge Instructions (Signed)
Sinus/Nasal Instructions:  1. Limited activity 2. Liquid and soft diet 3. May bathe and shower 4. Saline nasal spray - 4 puffs/nostril every hour while awake, begin the morning after surgery 5. Elevate Head of Bed 6. No nose blowing/Open mouth sneeze   Call [facility] for any nasal questions or concerns: [phone]

## 2021-10-20 NOTE — Consult Note (Signed)
ENT CONSULT:  Reason for Consult: Acute pituitary swelling Referring Physician: Dr. Saintclair Halsted, neurosurgery  Kyle Wilcox is an 71 y.o. male.  HPI: Patient admitted to [facility] with acute symptoms of headache and visual change.  Work-up including MRI scan showed heterogeneous enlargement of the pituitary as most likely source for the patient's symptoms.  Findings consistent with possible acute pituitary hemorrhage versus apoplexy.  Patient monitored in the hospital with high-dose steroid therapy, improved symptoms with decreased headache and ocular changes.  ENT service consulted for evaluation for endoscopic transsphenoidal pituitary approach.  Past Medical History:  Diagnosis Date   Allergy 2014   Arthritis    [employer]rees[employer] 02/16/2020   Cancer ([place]) 2015   prostate cancer   Cataract 2016   COVID-19 05/2019   COVID-19 05/2019   DDD (degenerative disc disease), lumbar 12/24/2014   GERD (gastroesophageal reflux disease)    Glaucoma    Hx of colonic polyps 02/19/2011   Hyperlipidemia 11/07/2014   Peripheral neuropathy 12/24/2014    Past Surgical History:  Procedure Laterality Date   COLONOSCOPY     EYE SURGERY     laser surgery to relieve pressure on eyes- both eyes   JOINT REPLACEMENT  2011   total shoulder replacement (Right)   LAPAROTOMY N/A 01/06/2018   Procedure: EXPLORATORY LAPAROTOMY;  Surgeon: Judeth Horn, MD;  Location: [place];  Service: General;  Laterality: N/A;   LYSIS OF ADHESION N/A 01/06/2018   Procedure: LYSIS OF ADHESION;  Surgeon: Judeth Horn, MD;  Location: [facility];  Service: General;  Laterality: N/A;   PROSTATE SURGERY     2015   SMALL INTESTINE SURGERY N/A    [employer]rees[employer] 02/16/2020   TONSILLECTOMY     TOTAL SHOULDER ARTHROPLASTY Left 03/04/2020   Procedure: TOTAL SHOULDER ARTHROPLASTY;  Surgeon: Marchia Bond, MD;  Location: [facility];  Service: Orthopedics;  Laterality: Left;   VASECTOMY  1982    Family History  Problem Relation Age of Onset   Arthritis Mother     Heart disease Mother    Hyperlipidemia Mother    Hypertension Mother    Stroke Mother    Vision loss Mother    Arthritis Father    Depression Father    Heart disease Father    Hyperlipidemia Father    Stroke Father    Arthritis Sister    Cancer Sister    Diabetes Sister    Hyperlipidemia Sister    Arthritis Brother    Diabetes Brother    Heart disease Brother    Hyperlipidemia Brother    Hypertension Brother    Stomach cancer Maternal Grandfather    Colon cancer Neg Hx    Esophageal cancer Neg Hx    Rectal cancer Neg Hx     Social History:  reports that he quit smoking about 40 years ago. His smoking use included cigarettes. He has never used smokeless tobacco. He reports current alcohol use of about 2.0 standard drinks per week. He reports that he does not use drugs.  Allergies: No Known Allergies  Medications: I have reviewed the patient's current medications.  Results for orders placed or performed during the hospital encounter of 10/17/21 (from the past 48 hour(s))  Glucose, capillary     Status: Abnormal   Collection Time: 10/18/21  3:26 PM  Result Value Ref Range   Glucose-Capillary 234 (H) 70 - 99 mg/dL    Comment: Glucose reference range applies only to samples taken after fasting for at least 8 hours.  Glucose,  capillary     Status: Abnormal   Collection Time: 10/18/21  5:58 PM  Result Value Ref Range   Glucose-Capillary 214 (H) 70 - 99 mg/dL    Comment: Glucose reference range applies only to samples taken after fasting for at least 8 hours.  Glucose, capillary     Status: Abnormal   Collection Time: 10/18/21  9:12 PM  Result Value Ref Range   Glucose-Capillary 160 (H) 70 - 99 mg/dL    Comment: Glucose reference range applies only to samples taken after fasting for at least 8 hours.  Glucose, capillary     Status: Abnormal   Collection Time: 10/19/21  7:32 AM  Result Value Ref Range   Glucose-Capillary 167 (H) 70 - 99 mg/dL    Comment: Glucose reference  range applies only to samples taken after fasting for at least 8 hours.  Glucose, capillary     Status: Abnormal   Collection Time: 10/19/21 11:20 AM  Result Value Ref Range   Glucose-Capillary 305 (H) 70 - 99 mg/dL    Comment: Glucose reference range applies only to samples taken after fasting for at least 8 hours.  Glucose, capillary     Status: Abnormal   Collection Time: 10/19/21  4:46 PM  Result Value Ref Range   Glucose-Capillary 140 (H) 70 - 99 mg/dL    Comment: Glucose reference range applies only to samples taken after fasting for at least 8 hours.  Glucose, capillary     Status: Abnormal   Collection Time: 10/19/21  9:43 PM  Result Value Ref Range   Glucose-Capillary 166 (H) 70 - 99 mg/dL    Comment: Glucose reference range applies only to samples taken after fasting for at least 8 hours.  Basic metabolic panel     Status: Abnormal   Collection Time: 10/20/21  3:41 AM  Result Value Ref Range   Sodium 141 135 - 145 mmol/L   Potassium 4.2 3.5 - 5.1 mmol/L   Chloride 107 98 - 111 mmol/L   CO2 29 22 - 32 mmol/L   Glucose, Bld 165 (H) 70 - 99 mg/dL    Comment: Glucose reference range applies only to samples taken after fasting for at least 8 hours.   BUN 22 8 - 23 mg/dL   Creatinine, Ser 0.90 0.61 - 1.24 mg/dL   Calcium 9.3 8.9 - 10.3 mg/dL   GFR, Estimated >60 >60 mL/min    Comment: (NOTE) Calculated using the CKD-EPI Creatinine Equation (2021)    Anion gap 5 5 - 15    Comment: Performed at [facility] [address], [place], [place] [address]  Glucose, capillary     Status: Abnormal   Collection Time: 10/20/21  7:37 AM  Result Value Ref Range   Glucose-Capillary 148 (H) 70 - 99 mg/dL    Comment: Glucose reference range applies only to samples taken after fasting for at least 8 hours.    CT MAXILLOFACIAL WO CONTRAST  Result Date: 10/19/2021 CLINICAL DATA:  Preoperative evaluation for pituitary gland removal. EXAM: CT MAXILLOFACIAL WITHOUT CONTRAST TECHNIQUE:  Multidetector CT imaging of the maxillofacial structures was performed. Multiplanar CT image reconstructions were also generated. RADIATION DOSE REDUCTION: This exam was performed according to the departmental dose-optimization program which includes automated exposure control, adjustment of the mA and/or kV according to patient size and/or use of iterative reconstruction technique. COMPARISON:  MRI studies done 10/17/2021 FINDINGS: Osseous: No regional osseous abnormality. Orbits: Normal Sinuses: Frontal, ethmoid and maxillary sinuses are  clear. Mild mucosal thickening along the posterior aspect of the dominant left sphenoid sinus division. Right sphenoid sinus division is diminutive. Mild S-shaped curvature of the nasal septum. Soft tissues: Otherwise negative Limited intracranial: See results of prior brain imaging. IMPRESSION: Dominant left division of the sphenoid sinus. Hypoplastic right division. Mild mucosal thickening along the posterior aspect of the dominant left division. Other paranasal sinuses are clear. Mild S shaped curvature of the nasal septum. Electronically Signed   By: Nelson Chimes M.D.   On: 10/19/2021 17:59    [id number] systems reviewed and negative except as stated in HPI    Blood pressure (!) 157/64, pulse (!) 45, temperature 97.6 F (36.4 C), temperature source Oral, resp. rate 16, height '5\' 8"'$  (1.727 m), weight 80.3 kg, SpO2 97 %.  PHYSICAL EXAM: General appearance -patient alert and awake, conversational and in no distress Mental status - alert, oriented to person, place, and time Eyes - right eye normal, swelling with left lid droop. Nose -deviated nasal septum with partial right-sided obstruction.  No discharge, mass or polyp  Studies Reviewed: Maxillofacial CT scan shows normal-appearing sinus anatomy with minimal mucosal thickening in the left sphenoid sinus, no air-fluid levels, polyps or obstruction.  The patient has a right septal deviation with partial  obstruction.  Assessment/Plan: Patient admitted to the neurosurgical service for management of acute severe headache and visual change.  Work-up in the ER including CT and MRI scan showed heterogeneous changes in the pituitary gland consistent with possible hemorrhage versus acute swelling.  Patient has responded to initial steroid therapy.  Based on his presentation and finding he is scheduled to undergo endoscopic transsphenoidal pituitary resection with Dr. Zenaida Deed 10/20/2021, 12:43 PM

## 2021-10-20 NOTE — Transfer of Care (Signed)
Immediate Anesthesia Transfer of Care Note  Patient: Kyle Wilcox  Procedure(s) Performed: Endoscopic transphenoidal resection of pituitary tumor (Head) TRANSPHENOIDAL APPROACH EXPOSURE (Head)  Patient Location: PACU  Anesthesia Type:General  Level of Consciousness: awake and alert   Airway & Oxygen Therapy: Patient Spontanous Breathing and Patient connected to face mask oxygen  Post-op Assessment: Report given to RN, Post -op Vital signs reviewed and stable, Patient moving all extremities X 4 and Patient able to stick tongue midline  Post vital signs: Reviewed and stable  Last Vitals:  Vitals Value Taken Time  BP 155/83 10/20/21 1546  Temp    Pulse 74 10/20/21 1550  Resp 14 10/20/21 1550  SpO2 94 % 10/20/21 1550  Vitals shown include unvalidated device data.  Last Pain:  Vitals:   10/20/21 1116  TempSrc:   PainSc: 0-No pain         Complications: No notable events documented.

## 2021-10-20 NOTE — Anesthesia Preprocedure Evaluation (Signed)
Anesthesia Evaluation  Patient identified by MRN, date of birth, ID band Patient awake    Reviewed: Allergy & Precautions, NPO status , Patient's Chart, lab work & pertinent test results  Airway Mallampati: II  TM Distance: >3 FB Neck ROM: Full    Dental  (+) Dental Advisory Given   Pulmonary former smoker,    breath sounds clear to auscultation       Cardiovascular negative cardio ROS   Rhythm:Regular Rate:Normal     Neuro/Psych Pituitary tumor  Neuromuscular disease    GI/Hepatic Neg liver ROS, GERD  ,  Endo/Other  negative endocrine ROS  Renal/GU negative Renal ROS     Musculoskeletal  (+) Arthritis ,   Abdominal   Peds  Hematology negative hematology ROS (+)   Anesthesia Other Findings   Reproductive/Obstetrics                             Anesthesia Physical Anesthesia Plan  ASA: 2  Anesthesia Plan: General   Post-op Pain Management: Tylenol PO (pre-op)* and Gabapentin PO (pre-op)*   Induction: Intravenous  PONV Risk Score and Plan: 2 and Dexamethasone, Ondansetron and Treatment may vary due to age or medical condition  Airway Management Planned: Oral ETT  Additional Equipment: Arterial line  Intra-op Plan:   Post-operative Plan: Extubation in OR  Informed Consent: I have reviewed the patients History and Physical, chart, labs and discussed the procedure including the risks, benefits and alternatives for the proposed anesthesia with the patient or authorized representative who has indicated his/her understanding and acceptance.     Dental advisory given  Plan Discussed with: CRNA  Anesthesia Plan Comments:         Anesthesia Quick Evaluation

## 2021-10-21 ENCOUNTER — Encounter (HOSPITAL_COMMUNITY): Payer: Self-pay | Admitting: Neurosurgery

## 2021-10-21 ENCOUNTER — Ambulatory Visit: Payer: Medicare Other

## 2021-10-21 LAB — GLUCOSE, CAPILLARY
Glucose-Capillary: 148 mg/dL — ABNORMAL HIGH (ref 70–99)
Glucose-Capillary: 177 mg/dL — ABNORMAL HIGH (ref 70–99)
Glucose-Capillary: 172 mg/dL — ABNORMAL HIGH (ref 70–99)
Glucose-Capillary: 165 mg/dL — ABNORMAL HIGH (ref 70–99)

## 2021-10-21 LAB — BASIC METABOLIC PANEL
Anion gap: 9 (ref 5–15)
BUN: 22 mg/dL (ref 8–23)
CO2: 25 mmol/L (ref 22–32)
Calcium: 8.8 mg/dL — ABNORMAL LOW (ref 8.9–10.3)
Chloride: 106 mmol/L (ref 98–111)
Creatinine, Ser: 0.83 mg/dL (ref 0.61–1.24)
GFR, Estimated: >60 mL/min (ref >60)
Glucose, Bld: 174 mg/dL — ABNORMAL HIGH (ref 70–99)
Potassium: 4.3 mmol/L (ref 3.5–5.1)
Sodium: 140 mmol/L (ref 135–145)

## 2021-10-21 MED ORDER — CLONIDINE HCL 0.1 MG PO TABS
0.1000 mg | ORAL_TABLET | Freq: Every day | ORAL | Status: DC
  Filled 2021-10-21: qty 1

## 2021-10-21 MED ORDER — HYDRALAZINE HCL 20 MG/ML IJ SOLN
5.0000 mg | INTRAMUSCULAR | Status: DC | PRN
  Administered 2021-10-21 – 2021-10-23 (×4): 10 mg via INTRAVENOUS
  Filled 2021-10-21 (×3): qty 1

## 2021-10-21 MED FILL — Thrombin For Soln 5000 Unit: CUTANEOUS | Qty: 5000 | Status: AC

## 2021-10-21 NOTE — Progress Notes (Signed)
Patient ID: Kyle Wilcox, male   DOB: July 11, 1950, 71 y.o.   MRN: [id number] BP (!) 123/52   Pulse (!) 50   Temp 98 F (36.7 C) (Oral)   Resp 18   Ht '5\' 8"'$  (1.727 m)   Wt 80.3 kg   SpO2 97%   BMI 26.92 kg/m  Alert, oriented x 4, speech is clear and fluent Moving all extremities well Diplopia, L partial lllrd nerve pasly Urine output is normal, will check labs.  Blood pressure is good at this time.

## 2021-10-21 NOTE — Anesthesia Postprocedure Evaluation (Signed)
Anesthesia Post Note  Patient: Kyle Wilcox  Procedure(s) Performed: Endoscopic transphenoidal resection of pituitary tumor (Head) TRANSPHENOIDAL APPROACH EXPOSURE (Head)     Patient location during evaluation: PACU Anesthesia Type: General Level of consciousness: awake and alert Pain management: pain level controlled Vital Signs Assessment: post-procedure vital signs reviewed and stable Respiratory status: spontaneous breathing, nonlabored ventilation, respiratory function stable and patient connected to nasal cannula oxygen Cardiovascular status: blood pressure returned to baseline and stable Postop Assessment: no apparent nausea or vomiting Anesthetic complications: no   No notable events documented.  Last Vitals:  Vitals:   10/21/21 1200 10/21/21 1300  BP: (!) 123/52 130/64  Pulse: (!) 50 (!) 44  Resp: 18 17  Temp:    SpO2: 97% 97%    Last Pain:  Vitals:   10/21/21 1200  TempSrc:   PainSc: 0-No pain                 Tiajuana Amass

## 2021-10-21 NOTE — Progress Notes (Signed)
Physical Therapy Treatment Patient Details Name: Kyle Wilcox MRN: [id number] DOB: [birth year] Today's Date: 10/21/2021   History of Present Illness 71 year old gentleman with a history of glaucoma, prostate cancer admitted with headache, drooping of eyelid and double vision.  Found to have acute 3rd nerve palsy with concern for possible DM related nerve palsy from taking too much steriods versus as a result of pituitary mass. S/p pituitary transsphenoidal, hypophysis ectomy on 6/6.    PT Comments    Pt reporting no additional deficits after surgery yesterday. Pt was independent with bed mobility and transfers, requiring supervision for ambulation of 428f and negotiation of 4 steps with no rail. Pt demonstrated mild balance deficits possibly due to reported visual deficits. Pt would continue to benefit from acute PT to address balance deficits and compensation strategies for visual deficits.    Recommendations for follow up therapy are one component of a multi-disciplinary discharge planning process, led by the attending physician.  Recommendations may be updated based on patient status, additional functional criteria and insurance authorization.  Follow Up Recommendations  No PT follow up     Assistance Recommended at Discharge PRN  Patient can return home with the following Assistance with cooking/housework;Assist for transportation   Equipment Recommendations  None recommended by PT    Recommendations for Other Services       Precautions / Restrictions Precautions Precautions: Fall Precaution Comments: blurry vision, visual field cuts Restrictions Weight Bearing Restrictions: No     Mobility  Bed Mobility Overal bed mobility: Modified Independent             General bed mobility comments: HOB elevated, no manual assist required    Transfers Overall transfer level: Modified independent Equipment used: Rolling walker (2 wheels)                     Ambulation/Gait Ambulation/Gait assistance: Supervision Gait Distance (Feet): 400 Feet Assistive device: Rolling walker (2 wheels), None (started with RW, transitioned to no AD during ambulation) Gait Pattern/deviations: Step-through pattern, Decreased stride length Gait velocity: decreased Gait velocity interpretation: >2.62 ft/sec, indicative of community ambulatory   General Gait Details: supervision for safety due to visual deficits and lines; see DGI   Stairs Stairs: Yes Stairs assistance: Supervision Stair Management: No rails, Alternating pattern Number of Stairs: 4 General stair comments: supervision for safety for line management and turning on step   Wheelchair Mobility    Modified Rankin (Stroke Patients Only)       Balance Overall balance assessment: Needs assistance Sitting-balance support: No upper extremity supported, Feet supported Sitting balance-Leahy Scale: Good     Standing balance support: No upper extremity supported, During functional activity Standing balance-Leahy Scale: Good Standing balance comment: mild balance deficits with turning and head turns                 Standardized Balance Assessment Standardized Balance Assessment : Dynamic Gait Index   Dynamic Gait Index Level Surface: Normal Change in Gait Speed: Mild Impairment Gait with Horizontal Head Turns: Mild Impairment Gait with Vertical Head Turns: Normal Gait and Pivot Turn: Mild Impairment Step Over Obstacle: Moderate Impairment Step Around Obstacles: Normal Steps: Normal Total Score: 19      Cognition Arousal/Alertness: Awake/alert Behavior During Therapy: WFL for tasks assessed/performed Overall Cognitive Status: Within Functional Limits for tasks assessed  Exercises      General Comments        Pertinent Vitals/Pain Pain Assessment Pain Assessment: No/denies pain    Home Living                           Prior Function            PT Goals (current goals can now be found in the care plan section) Acute Rehab PT Goals Patient Stated Goal: to return to independent PT Goal Formulation: With patient/family Time For Goal Achievement: 10/26/21 Potential to Achieve Goals: Good Progress towards PT goals: Progressing toward goals    Frequency    Min 3X/week      PT Plan Current plan remains appropriate    Co-evaluation              AM-PAC PT "6 Clicks" Mobility   Outcome Measure  Help needed turning from your back to your side while in a flat bed without using bedrails?: None Help needed moving from lying on your back to sitting on the side of a flat bed without using bedrails?: None Help needed moving to and from a bed to a chair (including a wheelchair)?: None Help needed standing up from a chair using your arms (e.g., wheelchair or bedside chair)?: None Help needed to walk in hospital room?: A Little Help needed climbing 3-5 steps with a railing? : A Little 6 Click Score: 22    End of Session Equipment Utilized During Treatment: Gait belt Activity Tolerance: Patient tolerated treatment well Patient left: in chair;with call bell/phone within reach Nurse Communication: Mobility status PT Visit Diagnosis: Other abnormalities of gait and mobility (R26.89)     Time: 0962-8366 PT Time Calculation (min) (ACUTE ONLY): 39 min  Charges:  $Gait Training: 23-37 mins                    Mackie Pai, SPT Acute Rehabilitation Services  Office: [phone]    Mackie Pai 10/21/2021, 10:27 AM

## 2021-10-21 NOTE — Plan of Care (Signed)
  Problem: Metabolic: Goal: Ability to maintain appropriate glucose levels will improve Outcome: Progressing   Problem: Skin Integrity: Goal: Risk for impaired skin integrity will decrease Outcome: Progressing   Problem: Tissue Perfusion: Goal: Adequacy of tissue perfusion will improve Outcome: Progressing   Problem: Education: Goal: Knowledge of General Education information will improve Description: Including pain rating scale, medication(s)/side effects and non-pharmacologic comfort measures Outcome: Progressing   Problem: Clinical Measurements: Goal: Respiratory complications will improve Outcome: Progressing Goal: Cardiovascular complication will be avoided Outcome: Progressing   Problem: Activity: Goal: Risk for activity intolerance will decrease Outcome: Progressing   Problem: Nutritional: Goal: Maintenance of adequate nutrition will improve Outcome: Completed/Met   Problem: Nutrition: Goal: Adequate nutrition will be maintained Outcome: Completed/Met

## 2021-10-21 NOTE — Progress Notes (Addendum)
0845[name] with MD Ashok Pall about SBP >150. Unable to give PRN metoprolol due to HR 30-40s. Verbal order received for clonidine 0.'1mg'$  daily. See MAR.  5686: Spoke with MD Ashok Pall about HR 30-40. Verbal order for hydralazine obtained by MD.

## 2021-10-22 LAB — GLUCOSE, CAPILLARY
Glucose-Capillary: 133 mg/dL — ABNORMAL HIGH (ref 70–99)
Glucose-Capillary: 122 mg/dL — ABNORMAL HIGH (ref 70–99)
Glucose-Capillary: 116 mg/dL — ABNORMAL HIGH (ref 70–99)
Glucose-Capillary: 115 mg/dL — ABNORMAL HIGH (ref 70–99)

## 2021-10-22 MED ORDER — HYDROCORTISONE 20 MG PO TABS
50.0000 mg | ORAL_TABLET | Freq: Two times a day (BID) | ORAL | Status: DC
  Administered 2021-10-22 – 2021-10-23 (×3): 50 mg via ORAL
  Filled 2021-10-22 (×4): qty 1

## 2021-10-22 NOTE — Progress Notes (Signed)
Subjective: Patient reports doing really well, no headaches  Objective: Vital signs in last 24 hours: Temp:  [97.8 F (36.6 C)-98.8 F (37.1 C)] 98 F (36.7 C) (06/08 0400) Pulse Rate:  [36-75] 40 (06/08 0630) Resp:  [9-19] 13 (06/08 0630) BP: (70-162)/(50-83) 142/61 (06/08 0630) SpO2:  [91 %-99 %] 94 % (06/08 0630)  Intake/Output from previous day: 06/07 0701 - 06/08 0700 In: 1885.5 [P.O.:100; I.V.:1785.5] Out: 3818 [Urine:1265] Intake/Output this shift: No intake/output data recorded.  Neurologic: Grossly normal, third nerve palsy not improved   Lab Results: Lab Results  Component Value Date   WBC 14.5 (H) 10/18/2021   HGB 15.7 10/18/2021   HCT 45.7 10/18/2021   MCV 90.9 10/18/2021   PLT 187 10/18/2021   Lab Results  Component Value Date   INR 1.0 10/18/2021   BMET Lab Results  Component Value Date   NA 140 10/21/2021   K 4.3 10/21/2021   CL 106 10/21/2021   CO2 25 10/21/2021   GLUCOSE 174 (H) 10/21/2021   BUN 22 10/21/2021   CREATININE 0.83 10/21/2021   CALCIUM 8.8 (L) 10/21/2021    Studies/Results: No results found.  Assessment/Plan: Postop day 2 pituitary tumor resection. Will wean down on steroid and move out to the floor today.    LOS: 4 days    Kyle Cornfield [facility] Wilcox 10/22/2021, 7:54 AM

## 2021-10-22 NOTE — Progress Notes (Signed)
Occupational Therapy Treatment Patient Details Name: Kyle Wilcox MRN: [id number] DOB: 01/16/1951 Today's Date: 10/22/2021   History of present illness 71 year old gentleman with a history of glaucoma, prostate cancer admitted with headache, drooping of eyelid and double vision.  Found to have acute 3rd nerve palsy with concern for possible DM related nerve palsy from taking too much steriods versus as a result of pituitary mass. S/p pituitary transsphenoidal, hypophysis ectomy on 6/6.   OT comments  Making good progress. Pt appears to be L eye dominant; nasal portion of R lens taped to reduce eye strain from diplopia and improve functional vision. Began education on compensatory strategies for low vision and given multiple activities to work on hand-eye coordination and depth perception. Recommend follow up with outpt OT and follow up with his eye doctor after DC. Will continue to follow. Pt very appreciative.   Recommendations for follow up therapy are one component of a multi-disciplinary discharge planning process, led by the attending physician.  Recommendations may be updated based on patient status, additional functional criteria and insurance authorization.    Follow Up Recommendations  Outpatient OT    Assistance Recommended at Discharge Intermittent Supervision/Assistance  Patient can return home with the following  A little help with walking and/or transfers;A little help with bathing/dressing/bathroom;Assist for transportation;Assistance with cooking/housework   Equipment Recommendations  None recommended by OT    Recommendations for Other Services      Precautions / Restrictions Precautions Precautions: Fall Precaution Comments: blurry vision, visual field cuts? double vision       Mobility Bed Mobility                    Transfers Overall transfer level: Needs assistance   Transfers: Sit to/from Stand Sit to Stand: Supervision                  Balance     Sitting balance-Leahy Scale: Good       Standing balance-Leahy Scale: Fair                             ADL either performed or assessed with clinical judgement   ADL                                         General ADL Comments: Supervision A provided for all ADLs and funcitonal mobility,    Extremity/Trunk Assessment              Vision   Vision Assessment?: Vision impaired- to be further tested in functional context Additional Comments: double vision; images side by side; ? field cut; unable to walk in the middle of the hallway; bumping into items on R; poor depth perception; appears to be L eye dominant; R nasal portion of lens occluded with significant improvement in functional vision and decreased eye strain   Perception     Praxis      Cognition Arousal/Alertness: Awake/alert Behavior During Therapy: WFL for tasks assessed/performed Overall Cognitive Status: Within Functional Limits for tasks assessed                                          Exercises      Shoulder Instructions  General Comments Educated on compensatory strategies for low vision using contrast, lighting and decreasing clutter; settings on phone adjusted to increase independence with phone use and decrease eye strain; worked with pt on compensatory strateiges for imparied depth perception; given eye hand coordination tasks    Pertinent Vitals/ Pain       Pain Assessment Pain Assessment: No/denies pain  Home Living                                          Prior Functioning/Environment              Frequency  Min 3X/week        Progress Toward Goals  OT Goals(current goals can now be found in the care plan section)  Progress towards OT goals: Progressing toward goals  Acute Rehab OT Goals Patient Stated Goal: to play golf again OT Goal Formulation: With patient Time For Goal Achievement:  11/02/21 Potential to Achieve Goals: Good ADL Goals Pt Will Perform Lower Body Dressing: Independently;sit to/from stand Pt Will Transfer to Toilet: Independently;ambulating Additional ADL Goal #1: Pt will indep manage glasses occlusion strategy to improve diplopia during ADLs Additional ADL Goal #2: Pt will indep recall at least 3 fall prevention techniques to apply to the home setting  Plan Discharge plan remains appropriate;Frequency needs to be updated    Co-evaluation                 AM-PAC OT "6 Clicks" Daily Activity     Outcome Measure   Help from another person eating meals?: None Help from another person taking care of personal grooming?: A Little Help from another person toileting, which includes using toliet, bedpan, or urinal?: A Little Help from another person bathing (including washing, rinsing, drying)?: A Little Help from another person to put on and taking off regular upper body clothing?: A Little Help from another person to put on and taking off regular lower body clothing?: A Little 6 Click Score: 19    End of Session    OT Visit Diagnosis: Low vision, both eyes (H54.2)   Activity Tolerance Patient tolerated treatment well   Patient Left in chair;with call bell/phone within reach;with family/visitor present   Nurse Communication Mobility status        Time: 6440-3474 OT Time Calculation (min): 44 min  Charges: OT General Charges $OT Visit: 1 Visit OT Treatments $Self Care/Home Management : 8-22 mins $Therapeutic Activity: 23-37 mins  Maurie Boettcher, OT/L   Acute OT Clinical Specialist Acute Rehabilitation Services Pager [phone] Office [phone]   [facility] 10/22/2021, 3:39 PM

## 2021-10-23 LAB — GLUCOSE, CAPILLARY: Glucose-Capillary: 98 mg/dL (ref 70–99)

## 2021-10-23 MED ORDER — HYDROCORTISONE 20 MG PO TABS: 20.0000 mg | ORAL_TABLET | Freq: Two times a day (BID) | ORAL | 0 refills | Status: DC

## 2021-10-23 NOTE — Plan of Care (Signed)
Discharge instructions reviewed with patient and wife. Verbalize understanding of instructions, including medications. Vital signs stable, patient A/O x4.

## 2021-10-23 NOTE — Discharge Summary (Signed)
Physician Discharge Summary  Patient ID: Kyle Wilcox MRN: [id number] DOB/AGE: 1951-01-02 71 y.o.  Admit date: 10/17/2021 Discharge date: 10/23/2021  Admission Diagnoses: Pituitary adenoma      Discharge Diagnoses: same   Discharged Condition: good  Hospital Course: The patient was admitted on 10/17/2021 and taken to the operating room where the patient underwent transphenoidal resection of pituitary tumor. The patient tolerated the procedure well and was taken to the recovery room and then to the ICU in stable condition. The hospital course was routine. There were no complications. The wound remained clean dry and intact. Pt had appropriate headaches at times. No complaints of new N/T/W. The patient remained afebrile with stable vital signs, and tolerated a regular diet. The patient continued to increase activities, and pain was well controlled with oral pain medications.   Consults: None  Significant Diagnostic Studies:  Results for orders placed or performed during the hospital encounter of 10/17/21  Comprehensive metabolic panel  Result Value Ref Range   Sodium 131 (L) 135 - 145 mmol/L   Potassium 4.1 3.5 - 5.1 mmol/L   Chloride 94 (L) 98 - 111 mmol/L   CO2 26 22 - 32 mmol/L   Glucose, Bld 191 (H) 70 - 99 mg/dL   BUN 10 8 - 23 mg/dL   Creatinine, Ser 0.91 0.61 - 1.24 mg/dL   Calcium 9.5 8.9 - 10.3 mg/dL   Total Protein 6.8 6.5 - 8.1 g/dL   Albumin 4.0 3.5 - 5.0 g/dL   AST 33 15 - 41 U/L   ALT 17 0 - 44 U/L   Alkaline Phosphatase 81 38 - 126 U/L   Total Bilirubin 1.1 0.3 - 1.2 mg/dL   GFR, Estimated >60 >60 mL/min   Anion gap 11 5 - 15  CBC with Differential/Platelet  Result Value Ref Range   WBC 17.8 (H) 4.0 - 10.5 K/uL   RBC 5.10 4.22 - 5.81 MIL/uL   Hemoglobin 16.0 13.0 - 17.0 g/dL   HCT 46.7 39.0 - 52.0 %   MCV 91.6 80.0 - 100.0 fL   MCH 31.4 26.0 - 34.0 pg   MCHC 34.3 30.0 - 36.0 g/dL   RDW 13.4 11.5 - 15.5 %   Platelets 232 150 - 400 K/uL   nRBC 0.0 0.0 - 0.2 %    Neutrophils Relative % 75 %   Neutro Abs 13.3 (H) 1.7 - 7.7 K/uL   Lymphocytes Relative 11 %   Lymphs Abs 1.9 0.7 - 4.0 K/uL   Monocytes Relative 14 %   Monocytes Absolute 2.5 (H) 0.1 - 1.0 K/uL   Eosinophils Relative 0 %   Eosinophils Absolute 0.0 0.0 - 0.5 K/uL   Basophils Relative 0 %   Basophils Absolute 0.0 0.0 - 0.1 K/uL   Immature Granulocytes 0 %   Abs Immature Granulocytes 0.08 (H) 0.00 - 0.07 K/uL  Hemoglobin A1c  Result Value Ref Range   Hgb A1c MFr Bld 5.4 4.8 - 5.6 %   Mean Plasma Glucose 108.28 mg/dL  CBC  Result Value Ref Range   WBC 14.5 (H) 4.0 - 10.5 K/uL   RBC 5.03 4.22 - 5.81 MIL/uL   Hemoglobin 15.7 13.0 - 17.0 g/dL   HCT 45.7 39.0 - 52.0 %   MCV 90.9 80.0 - 100.0 fL   MCH 31.2 26.0 - 34.0 pg   MCHC 34.4 30.0 - 36.0 g/dL   RDW 13.4 11.5 - 15.5 %   Platelets 187 150 - 400 K/uL   nRBC  0.0 0.0 - 0.2 %  Protime-INR  Result Value Ref Range   Prothrombin Time 13.4 11.4 - 15.2 seconds   INR 1.0 0.8 - 1.2  Comprehensive metabolic panel  Result Value Ref Range   Sodium 138 135 - 145 mmol/L   Potassium 3.0 (L) 3.5 - 5.1 mmol/L   Chloride 96 (L) 98 - 111 mmol/L   CO2 32 22 - 32 mmol/L   Glucose, Bld 108 (H) 70 - 99 mg/dL   BUN 6 (L) 8 - 23 mg/dL   Creatinine, Ser 0.95 0.61 - 1.24 mg/dL   Calcium 9.0 8.9 - 10.3 mg/dL   Total Protein 6.3 (L) 6.5 - 8.1 g/dL   Albumin 3.2 (L) 3.5 - 5.0 g/dL   AST 18 15 - 41 U/L   ALT 17 0 - 44 U/L   Alkaline Phosphatase 65 38 - 126 U/L   Total Bilirubin 0.8 0.3 - 1.2 mg/dL   GFR, Estimated >60 >60 mL/min   Anion gap 10 5 - 15  TSH  Result Value Ref Range   TSH 0.984 0.350 - 4.500 uIU/mL  Growth hormone  Result Value Ref Range   Growth Hormone 0.9 0.0 - 10.0 ng/mL  Prolactin  Result Value Ref Range   Prolactin 2.3 (L) 4.0 - 15.2 ng/mL  Glucose, capillary  Result Value Ref Range   Glucose-Capillary 234 (H) 70 - 99 mg/dL  Glucose, capillary  Result Value Ref Range   Glucose-Capillary 214 (H) 70 - 99 mg/dL   Glucose, capillary  Result Value Ref Range   Glucose-Capillary 160 (H) 70 - 99 mg/dL  Glucose, capillary  Result Value Ref Range   Glucose-Capillary 167 (H) 70 - 99 mg/dL  Glucose, capillary  Result Value Ref Range   Glucose-Capillary 305 (H) 70 - 99 mg/dL  Basic metabolic panel  Result Value Ref Range   Sodium 141 135 - 145 mmol/L   Potassium 4.2 3.5 - 5.1 mmol/L   Chloride 107 98 - 111 mmol/L   CO2 29 22 - 32 mmol/L   Glucose, Bld 165 (H) 70 - 99 mg/dL   BUN 22 8 - 23 mg/dL   Creatinine, Ser 0.90 0.61 - 1.24 mg/dL   Calcium 9.3 8.9 - 10.3 mg/dL   GFR, Estimated >60 >60 mL/min   Anion gap 5 5 - 15  Glucose, capillary  Result Value Ref Range   Glucose-Capillary 140 (H) 70 - 99 mg/dL  Glucose, capillary  Result Value Ref Range   Glucose-Capillary 166 (H) 70 - 99 mg/dL  Glucose, capillary  Result Value Ref Range   Glucose-Capillary 148 (H) 70 - 99 mg/dL  Glucose, capillary  Result Value Ref Range   Glucose-Capillary 192 (H) 70 - 99 mg/dL  Glucose, capillary  Result Value Ref Range   Glucose-Capillary 148 (H) 70 - 99 mg/dL  Glucose, capillary  Result Value Ref Range   Glucose-Capillary 177 (H) 70 - 99 mg/dL  Basic metabolic panel  Result Value Ref Range   Sodium 140 135 - 145 mmol/L   Potassium 4.3 3.5 - 5.1 mmol/L   Chloride 106 98 - 111 mmol/L   CO2 25 22 - 32 mmol/L   Glucose, Bld 174 (H) 70 - 99 mg/dL   BUN 22 8 - 23 mg/dL   Creatinine, Ser 0.83 0.61 - 1.24 mg/dL   Calcium 8.8 (L) 8.9 - 10.3 mg/dL   GFR, Estimated >60 >60 mL/min   Anion gap 9 5 - 15  Glucose, capillary  Result Value  Ref Range   Glucose-Capillary 172 (H) 70 - 99 mg/dL  Glucose, capillary  Result Value Ref Range   Glucose-Capillary 165 (H) 70 - 99 mg/dL  Glucose, capillary  Result Value Ref Range   Glucose-Capillary 133 (H) 70 - 99 mg/dL  Glucose, capillary  Result Value Ref Range   Glucose-Capillary 122 (H) 70 - 99 mg/dL  Glucose, capillary  Result Value Ref Range   Glucose-Capillary  116 (H) 70 - 99 mg/dL  Glucose, capillary  Result Value Ref Range   Glucose-Capillary 115 (H) 70 - 99 mg/dL  Glucose, capillary  Result Value Ref Range   Glucose-Capillary 98 70 - 99 mg/dL  CBG monitoring, ED  Result Value Ref Range   Glucose-Capillary 159 (H) 70 - 99 mg/dL   Comment 1 Notify RN    Comment 2 Document in Chart   Type and screen [place]  Result Value Ref Range   ABO/RH(D) A POS    Antibody Screen NEG    Sample Expiration      10/23/2021,2359 Performed at [facility], [address] [place], [place] [address]     CT MAXILLOFACIAL WO CONTRAST  Result Date: 10/19/2021 CLINICAL DATA:  Preoperative evaluation for pituitary gland removal. EXAM: CT MAXILLOFACIAL WITHOUT CONTRAST TECHNIQUE: Multidetector CT imaging of the maxillofacial structures was performed. Multiplanar CT image reconstructions were also generated. RADIATION DOSE REDUCTION: This exam was performed according to the departmental dose-optimization program which includes automated exposure control, adjustment of the mA and/or kV according to patient size and/or use of iterative reconstruction technique. COMPARISON:  MRI studies done 10/17/2021 FINDINGS: Osseous: No regional osseous abnormality. Orbits: Normal Sinuses: Frontal, ethmoid and maxillary sinuses are clear. Mild mucosal thickening along the posterior aspect of the dominant left sphenoid sinus division. Right sphenoid sinus division is diminutive. Mild S-shaped curvature of the nasal septum. Soft tissues: Otherwise negative Limited intracranial: See results of prior brain imaging. IMPRESSION: Dominant left division of the sphenoid sinus. Hypoplastic right division. Mild mucosal thickening along the posterior aspect of the dominant left division. Other paranasal sinuses are clear. Mild S shaped curvature of the nasal septum. Electronically Signed   By: Nelson Chimes M.D.   On: 10/19/2021 17:59   MR BRAIN W CONTRAST  Result Date:  10/17/2021 CLINICAL DATA:  Follow-up examination for abnormal brain MRI. EXAM: MRI HEAD WITH CONTRAST TECHNIQUE: Multiplanar, multiecho pulse sequences of the brain and surrounding structures were obtained with intravenous contrast. CONTRAST:  68m GADAVIST GADOBUTROL 1 MMOL/ML IV SOLN COMPARISON:  Previous brain MRI from earlier the same day. FINDINGS: Limited postcontrast thin section imaging through the sella and pituitary gland was performed. Pituitary gland is again seen to be abnormally enlarged and heterogeneous measuring approximately 1.5 x 1.2 x 1.9 cm. No visible internal fluid-fluid level. Suprasellar extension to abut the undersurface of the optic chiasm. Pituitary stalk appears slightly deviated to the right. No visible invasion of the adjacent cavernous sinus. Normal flow voids are preserved within the adjacent cavernous ICAs. Unclear whether there is actual enhancement, as this finding does demonstrate intrinsic precontrast T1 hyperintensity on prior brain MRI. IMPRESSION: Abnormally enlarged and heterogeneous appearance of the pituitary gland. Differential considerations remain the same, with possibilities including a complex Rathke's cleft cyst, pituitary macro adenoma, or pituitary apoplexy. Electronically Signed   By: BJeannine BogaM.D.   On: 10/17/2021 22:14   MR BRAIN W WO CONTRAST  Result Date: 10/17/2021 CLINICAL DATA:  Headache, new or worsening.  Blurred vision. EXAM:  MRI HEAD WITHOUT AND WITH CONTRAST TECHNIQUE: Multiplanar, multiecho pulse sequences of the brain and surrounding structures were obtained without and with intravenous contrast. CONTRAST:  74m GADAVIST GADOBUTROL 1 MMOL/ML IV SOLN COMPARISON:  CT and MR studies same day. FINDINGS: Brain: Diffusion imaging does not show any acute or subacute infarction. The brainstem and cerebellum are normal. Cerebral hemispheres are similarly normal. No cortical or large vessel territory infarction. No mass, hemorrhage, hydrocephalus  or extra-axial collection. The pituitary gland appears enlarged and shows increased T1 signal. The differential diagnosis is pituitary adenoma, Rathke's cleft cyst and pituitary apoplexy. In the setting acute headache, pituitary apoplexy is possible. Consider detailed pituitary sequences at this time. We will not be able to administer additional contrast, but the higher resolution imaging might be instructive. Vascular: Major vessels at the base of the brain show flow. Skull and upper cervical spine: Negative Sinuses/Orbits: Clear/normal Other: None IMPRESSION: Normal MRI of the brain itself. Abnormal pituitary gland which is enlarged and shows abnormal signal. The differential diagnosis is pituitary adenoma versus Rathke's cleft cyst versus pituitary apoplexy. In the setting of acute headache, pituitary apoplexy is of particular concern. Consider the addition of detailed pituitary sequences. We will not be able to administer additional contrast, but the more detailed imaging might be instructive. Electronically Signed   By: MNelson ChimesM.D.   On: 10/17/2021 18:51   MR ANGIO HEAD WO CONTRAST  Result Date: 10/17/2021 CLINICAL DATA:  Headache, new or worsening.  Blurred vision. EXAM: MRA HEAD WITHOUT CONTRAST TECHNIQUE: Angiographic images of the Circle of Willis were acquired using MRA technique without intravenous contrast. COMPARISON:  CT and MR studies same day. FINDINGS: Anterior circulation: Both internal carotid arteries are patent through the skull base and siphon regions. The left anterior and middle cerebral arteries are normal. The right anterior cerebral artery is normal. There is stenosis of the distal right M1 segment/middle cerebral artery bifurcation. Posterior circulation: Both vertebral arteries are patent through the skull base with the left being dominant. Both vertebral arteries reach the basilar. No basilar stenosis. Posteroinferior cerebellar arteries show flow. Both superior cerebellar  arteries show flow, the right being larger than the left. Both posterior cerebral arteries are patent. Anatomic variants: None other significant. Other: None IMPRESSION: No intracranial large vessel occlusion. Stenosis of the distal right M1 segment/right MCA bifurcation which could place the patient at risk of MCA territory infarction. Electronically Signed   By: MNelson ChimesM.D.   On: 10/17/2021 18:47   MR MRV HEAD W WO CONTRAST  Result Date: 10/17/2021 CLINICAL DATA:  Headache, new or worsening.  Blurred vision. EXAM: MR VENOGRAM HEAD WITHOUT AND WITH CONTRAST TECHNIQUE: Angiographic images of the intracranial venous structures were acquired using MRV technique without and with intravenous contrast. CONTRAST:  810mGADAVIST GADOBUTROL 1 MMOL/ML IV SOLN COMPARISON:  CT and MR studies same day. FINDINGS: Superior sagittal sinus is widely patent. Transverse sinuses are patent. The right is dominant. The left is diminutive but there is no finding to suggest thrombosis. Both jugular veins show flow. IMPRESSION: No evidence of intracranial venous thrombosis. Diminutive left transverse sinus, but no finding to suggest thrombosis. Electronically Signed   By: MaNelson Chimes.D.   On: 10/17/2021 18:44   CT Head Wo Contrast  Result Date: 10/17/2021 CLINICAL DATA:  Severe headache.  Dilated right pupil. EXAM: CT HEAD WITHOUT CONTRAST TECHNIQUE: Contiguous axial images were obtained from the base of the skull through the vertex without intravenous contrast. RADIATION DOSE REDUCTION: This  exam was performed according to the departmental dose-optimization program which includes automated exposure control, adjustment of the mA and/or kV according to patient size and/or use of iterative reconstruction technique. COMPARISON:  None Available. FINDINGS: Brain: No evidence of intracranial hemorrhage, acute infarction, hydrocephalus, extra-axial collection, or mass lesion/mass effect. Vascular:  No hyperdense vessel or other  acute findings. Skull: No evidence of fracture or other significant bone abnormality. Sinuses/Orbits:  No acute findings. Other: None. IMPRESSION: Negative noncontrast head CT. Electronically Signed   By: Marlaine Hind M.D.   On: 10/17/2021 11:26    Antibiotics:  Anti-infectives (From admission, onward)    Start     Dose/Rate Route Frequency Ordered Stop   10/21/21 1000  cephALEXin (KEFLEX) capsule 500 mg  Status:  Discontinued        500 mg Oral 3 times daily 10/20/21 1653 10/20/21 1723   10/20/21 1722  cephALEXin (KEFLEX) capsule 500 mg  Status:  Discontinued        500 mg Oral 3 times daily 10/20/21 1723 10/22/21 0754   10/20/21 1300  ceFAZolin (ANCEF) IVPB 2g/100 mL premix        2 g 200 mL/hr over 30 Minutes Intravenous  Once 10/20/21 1256 10/20/21 1321   10/20/21 0000  cephALEXin (KEFLEX) 500 MG capsule        500 mg Oral 3 times daily 10/20/21 1526 10/27/21 2359   10/19/21 1900  terbinafine (LAMISIL) tablet 250 mg  Status:  Discontinued        250 mg Oral Daily 10/19/21 1814 10/19/21 1820       Discharge Exam: Blood pressure (!) 145/71, pulse (!) 43, temperature 97.6 F (36.4 C), temperature source Oral, resp. rate 17, height '5\' 8"'$  (1.727 m), weight 80.3 kg, SpO2 96 %. Neurologic: Grossly normal Ambulating and voiding well   Discharge Medications:   Allergies as of 10/23/2021   No Known Allergies      Medication List     STOP taking these medications    meloxicam 15 MG tablet Commonly known as: MOBIC   predniSONE 10 MG tablet Commonly known as: DELTASONE   terbinafine 250 MG tablet Commonly known as: LamISIL       TAKE these medications    aspirin EC 81 MG tablet Take 81 mg by mouth daily. Swallow whole.   atorvastatin 40 MG tablet Commonly known as: LIPITOR TAKE 1 TABLET DAILY AT 6PM What changed: See the new instructions.   cephALEXin 500 MG capsule Commonly known as: Keflex Take 1 capsule (500 mg total) by mouth 3 (three) times daily for 7 days.    diphenhydrAMINE 25 MG tablet Commonly known as: BENADRYL Take 25 mg by mouth daily as needed for allergies.   dorzolamide-timolol 22.3-6.8 MG/ML ophthalmic solution Commonly known as: COSOPT Place 1 drop into both eyes 2 (two) times daily.   FIBER PO Take 1 capsule by mouth 2 (two) times daily.   fluticasone 50 MCG/ACT nasal spray Commonly known as: FLONASE Place 2 sprays into both nostrils daily.   gabapentin 600 MG tablet Commonly known as: NEURONTIN Take 1 tablet (600 mg total) by mouth 2 (two) times daily. What changed: when to take this   guaiFENesin 600 MG 12 hr tablet Commonly known as: MUCINEX Take 600 mg by mouth 2 (two) times daily.   hydrocortisone 20 MG tablet Commonly known as: CORTEF Take 1 tablet (20 mg total) by mouth 2 (two) times daily.   metroNIDAZOLE 0.75 % gel Commonly known as: METROGEL Apply  1 application. topically 2 (two) times daily. What changed:  when to take this additional instructions   Omega-3 Fish Oil 300 MG Caps Take 300 mg by mouth daily.   ondansetron 4 MG tablet Commonly known as: Zofran Take 1 tablet (4 mg total) by mouth every 8 (eight) hours as needed for nausea or vomiting.   ONE DAILY MULTIPLE VITAMIN PO Take 1 tablet by mouth daily.   traZODone 50 MG tablet Commonly known as: DESYREL TAKE 1/2 TO 1 (ONE-HALF TO ONE) TABLET BY MOUTH AT BEDTIME AS NEEDED FOR SLEEP What changed:  how much to take how to take this when to take this reasons to take this additional instructions   vitamin C 500 MG tablet Commonly known as: ASCORBIC ACID Take 500 mg by mouth daily.   Vyzulta 0.024 % Soln Generic drug: Latanoprostene Bunod Place 1 drop into both eyes at bedtime.        Disposition: home   Final Dx: transphenoidal resection of pituitary tumor  Discharge Instructions     Call MD for:  difficulty breathing, headache or visual disturbances   Complete by: As directed    Call MD for:  persistant nausea and  vomiting   Complete by: As directed    Call MD for:  severe uncontrolled pain   Complete by: As directed    Call MD for:  temperature >100.4   Complete by: As directed    Diet - low sodium heart healthy   Complete by: As directed    Increase activity slowly   Complete by: As directed    No wound care   Complete by: As directed         Follow-up Information     Jerrell Belfast, MD. Schedule an appointment as soon as possible for a visit in 2 week(s).   Specialty: Otolaryngology Contact information: [address] [place] [address] [phone]         Ashok Pall, MD. Schedule an appointment as soon as possible for a visit in 2 week(s).   Specialty: Neurosurgery Contact information: [address] [place] [address] [place] [address] [phone]                  Signed: Ocie [facility] 10/23/2021, 8:00 AM

## 2021-10-23 NOTE — TOC Transition Note (Signed)
Transition of Care [facility]) - CM/SW Discharge Note   Patient Details  Name: Kyle Wilcox MRN: [id number] Date of Birth: 06-09-1950  Transition [facility]) CM/SW Contact:  Ella Bodo, RN Phone Number: 10/23/2021, 10:06 AM   Clinical Narrative:    71 year old gentleman with a history of glaucoma, prostate cancer admitted with headache, drooping of eyelid and double vision.  Found to have acute 3rd nerve palsy with concern for possible DM related nerve palsy from taking too much steriods versus as a result of pituitary mass. S/p pituitary transsphenoidal, hypophysis ectomy on 6/6. PTA, pt independent and living at home with spouse, who can provide needed assistance at dc.  PT recommending no OP follow up; OT recommending OP follow up.  Will refer to [facility] for follow up.      Final next level of care: OP Rehab Barriers to Discharge: Barriers Resolved                            Discharge Plan and Services   Discharge Planning Services: CM Consult                                 Social Determinants of Health (SDOH) Interventions     Readmission Risk Interventions     No data to display         Reinaldo Raddle, RN, BSN  Trauma/Neuro ICU Case Manager [phone]

## 2021-10-26 ENCOUNTER — Encounter: Payer: Self-pay | Admitting: Family Medicine

## 2021-10-26 LAB — SURGICAL PATHOLOGY

## 2021-10-28 ENCOUNTER — Ambulatory Visit: Payer: Medicare Other | Admitting: Physical Therapy

## 2021-10-29 DIAGNOSIS — D352 Benign neoplasm of pituitary gland: Secondary | ICD-10-CM | POA: Diagnosis not present

## 2021-10-29 DIAGNOSIS — H532 Diplopia: Secondary | ICD-10-CM | POA: Diagnosis not present

## 2021-10-29 DIAGNOSIS — H5340 Unspecified visual field defects: Secondary | ICD-10-CM | POA: Diagnosis not present

## 2021-11-29 ENCOUNTER — Other Ambulatory Visit: Payer: Self-pay | Admitting: Family Medicine

## 2021-11-29 DIAGNOSIS — G6289 Other specified polyneuropathies: Secondary | ICD-10-CM

## 2021-11-29 DIAGNOSIS — F5104 Psychophysiologic insomnia: Secondary | ICD-10-CM

## 2021-12-01 NOTE — Telephone Encounter (Signed)
Requested Prescriptions  Pending Prescriptions Disp Refills  . gabapentin (NEURONTIN) 600 MG tablet [Pharmacy Med Name: GABAPENTIN TAB '600MG'$ ] 180 tablet 0    Sig: Take 1 tablet (600 mg total) by mouth in the morning and at bedtime.     Neurology: Anticonvulsants - gabapentin Passed - 11/29/2021  5:43 PM      Passed - Cr in normal range and within 360 days    Creat  Date Value Ref Range Status  06/11/2021 0.92 0.70 - 1.28 mg/dL Final   Creatinine, Ser  Date Value Ref Range Status  10/21/2021 0.83 0.61 - 1.24 mg/dL Final         Passed - Completed PHQ-2 or PHQ-9 in the last 360 days      Passed - Valid encounter within last 12 months    Recent Outpatient Visits          2 months ago [place] Susy Frizzle, MD   5 months ago Ingrown toenail of left foot   [place] Denn[facility] Schaumann, Cammie Mcgee, MD   9 months ago Prostate cancer [facility])   [facility] Susy Frizzle, MD   1 year ago General medical exam   [place] Susy Frizzle, MD   2 years ago Strain of lumbar region, initial encounter   [facility], Cammie Mcgee, MD

## 2021-12-25 ENCOUNTER — Other Ambulatory Visit: Payer: Self-pay | Admitting: Neurosurgery

## 2021-12-25 DIAGNOSIS — D497 Neoplasm of unspecified behavior of endocrine glands and other parts of nervous system: Secondary | ICD-10-CM

## 2021-12-30 DIAGNOSIS — L570 Actinic keratosis: Secondary | ICD-10-CM | POA: Diagnosis not present

## 2021-12-30 DIAGNOSIS — L538 Other specified erythematous conditions: Secondary | ICD-10-CM | POA: Diagnosis not present

## 2021-12-30 DIAGNOSIS — L718 Other rosacea: Secondary | ICD-10-CM | POA: Diagnosis not present

## 2021-12-30 DIAGNOSIS — L820 Inflamed seborrheic keratosis: Secondary | ICD-10-CM | POA: Diagnosis not present

## 2022-01-03 ENCOUNTER — Ambulatory Visit
Admission: RE | Admit: 2022-01-03 | Discharge: 2022-01-03 | Disposition: A | Discharge status: Home / Self Care | Payer: Medicare Other | Source: Ambulatory Visit | Attending: Neurosurgery | Admitting: Neurosurgery

## 2022-01-03 DIAGNOSIS — Z9889 Other specified postprocedural states: Secondary | ICD-10-CM | POA: Diagnosis not present

## 2022-01-03 DIAGNOSIS — D497 Neoplasm of unspecified behavior of endocrine glands and other parts of nervous system: Secondary | ICD-10-CM

## 2022-01-03 DIAGNOSIS — E237 Disorder of pituitary gland, unspecified: Secondary | ICD-10-CM | POA: Diagnosis not present

## 2022-01-03 DIAGNOSIS — D352 Benign neoplasm of pituitary gland: Secondary | ICD-10-CM | POA: Diagnosis not present

## 2022-01-03 MED ORDER — GADOBENATE DIMEGLUMINE 529 MG/ML IV SOLN
15.0000 mL | Freq: Once | INTRAVENOUS | Status: AC | PRN
Start: 2022-01-03 — End: 2022-01-03
  Administered 2022-01-03: 15 mL via INTRAVENOUS

## 2022-01-11 ENCOUNTER — Encounter: Payer: Self-pay | Admitting: Family Medicine

## 2022-01-11 NOTE — Telephone Encounter (Signed)
I have checked and there is no future appts w/pcp. Please called pt and make an appt.  Thank you.

## 2022-02-04 ENCOUNTER — Encounter: Payer: Medicare Other | Admitting: Family Medicine

## 2022-02-10 DIAGNOSIS — H401122 Primary open-angle glaucoma, left eye, moderate stage: Secondary | ICD-10-CM | POA: Diagnosis not present

## 2022-02-10 DIAGNOSIS — H401213 Low-tension glaucoma, right eye, severe stage: Secondary | ICD-10-CM | POA: Diagnosis not present

## 2022-02-17 DIAGNOSIS — Z23 Encounter for immunization: Secondary | ICD-10-CM | POA: Diagnosis not present

## 2022-03-01 ENCOUNTER — Other Ambulatory Visit: Payer: Medicare Other

## 2022-03-01 DIAGNOSIS — R7309 Other abnormal glucose: Secondary | ICD-10-CM | POA: Diagnosis not present

## 2022-03-01 DIAGNOSIS — C61 Malignant neoplasm of prostate: Secondary | ICD-10-CM

## 2022-03-01 DIAGNOSIS — E782 Mixed hyperlipidemia: Secondary | ICD-10-CM | POA: Diagnosis not present

## 2022-03-01 DIAGNOSIS — D352 Benign neoplasm of pituitary gland: Secondary | ICD-10-CM | POA: Diagnosis not present

## 2022-03-01 DIAGNOSIS — E049 Nontoxic goiter, unspecified: Secondary | ICD-10-CM

## 2022-03-01 DIAGNOSIS — E663 Overweight: Secondary | ICD-10-CM | POA: Diagnosis not present

## 2022-03-04 ENCOUNTER — Ambulatory Visit (INDEPENDENT_AMBULATORY_CARE_PROVIDER_SITE_OTHER): Payer: Medicare Other | Admitting: Family Medicine

## 2022-03-04 VITALS — BP 120/82 | HR 50 | Ht 68.0 in | Wt 175.0 lb

## 2022-03-04 DIAGNOSIS — Z8546 Personal history of malignant neoplasm of prostate: Secondary | ICD-10-CM | POA: Diagnosis not present

## 2022-03-04 DIAGNOSIS — Z0000 Encounter for general adult medical examination without abnormal findings: Secondary | ICD-10-CM | POA: Diagnosis not present

## 2022-03-04 DIAGNOSIS — D352 Benign neoplasm of pituitary gland: Secondary | ICD-10-CM | POA: Diagnosis not present

## 2022-03-04 DIAGNOSIS — E7800 Pure hypercholesterolemia, unspecified: Secondary | ICD-10-CM | POA: Diagnosis not present

## 2022-03-04 MED ORDER — ROSUVASTATIN CALCIUM 20 MG PO TABS: 20.0000 mg | ORAL_TABLET | Freq: Every day | ORAL | 3 refills | Status: DC

## 2022-03-04 NOTE — Progress Notes (Signed)
Subjective:    Patient ID: Kyle Wilcox, male    DOB: 14-Mar-1951, 71 y.o.   MRN: [id number]  HPI Patient is a very pleasant 71 year old Caucasian male here today for his Medicare wellness visit.  He denies any depression, falls, or memory loss.  He does have a remote history of smoking.  He began smoking in the late 60s and quit in the early 80s.  He was screened for AAA in 2017 and this was normal.  He does not have sufficient smoking history to warrant a CT scan of the lungs for lung cancer.  His last colonoscopy was in 2018 and was normal without any polyps or irregularities.  He does have a history of prostate cancer and had his prostate removed.  Since I last saw the patient, he was diagnosed with a neuroendocrine tumor on his pituitary gland.  He underwent surgery to have this resected.  They believe that they got it all.  Biopsy was consistent with pituitary adenoma/neuroendocrine tumor.  They recommended repeat scans every 3 months to evaluate for any recurrence.  He seen neurology for this.  His most recent lab work is listed below.  He is already had his flu shot.  He is due for COVID-vaccine. Lab on 03/01/2022  Component Date Value Ref Range Status   Hgb A1c MFr Bld 03/01/2022 5.6  <5.7 % of total Hgb Final   Comment: For the purpose of screening for the presence of diabetes: . <5.7%       Consistent with the absence of diabetes 5.7-6.4%    Consistent with increased risk for diabetes             (prediabetes) > or =6.5%  Consistent with diabetes . This assay result is consistent with a decreased risk of diabetes. . Currently, no consensus exists regarding use of hemoglobin A1c for diagnosis of diabetes in children. . According to American Diabetes Association (ADA) guidelines, hemoglobin A1c <7.0% represents optimal control in non-pregnant diabetic patients. Different metrics may apply to specific patient populations.  Standards of Medical Care in Diabetes(ADA). .    Mean  Plasma Glucose 03/01/2022 114  mg/dL Final   eAG (mmol/L) 03/01/2022 6.3  mmol/L Final   WBC 03/01/2022 5.4  3.8 - 10.8 Thousand/uL Final   RBC 03/01/2022 4.74  4.20 - 5.80 Million/uL Final   Hemoglobin 03/01/2022 14.9  13.2 - 17.1 g/dL Final   HCT 03/01/2022 43.5  38.5 - 50.0 % Final   MCV 03/01/2022 91.8  80.0 - 100.0 fL Final   MCH 03/01/2022 31.4  27.0 - 33.0 pg Final   MCHC 03/01/2022 34.3  32.0 - 36.0 g/dL Final   RDW 03/01/2022 13.6  11.0 - 15.0 % Final   Platelets 03/01/2022 224  140 - 400 Thousand/uL Final   MPV 03/01/2022 10.0  7.5 - 12.5 fL Final   Neutro Abs 03/01/2022 2,641  1,500 - 7,800 cells/uL Final   Lymphs Abs 03/01/2022 1,939  850 - 3,900 cells/uL Final   Absolute Monocytes 03/01/2022 594  200 - 950 cells/uL Final   Eosinophils Absolute 03/01/2022 189  15 - 500 cells/uL Final   Basophils Absolute 03/01/2022 38  0 - 200 cells/uL Final   Neutrophils Relative % 03/01/2022 48.9  % Final   Total Lymphocyte 03/01/2022 35.9  % Final   Monocytes Relative 03/01/2022 11.0  % Final   Eosinophils Relative 03/01/2022 3.5  % Final   Basophils Relative 03/01/2022 0.7  % Final   Glucose, Bld  03/01/2022 89  65 - 99 mg/dL Final   Comment: .            Fasting reference interval .    BUN 03/01/2022 16  7 - 25 mg/dL Final   Creat 03/01/2022 1.03  0.70 - 1.28 mg/dL Final   BUN/Creatinine Ratio 03/01/2022 SEE NOTE:  6 - 22 (calc) Final   Comment:    Not Reported: BUN and Creatinine are within    reference range. .    Sodium 03/01/2022 143  135 - 146 mmol/L Final   Potassium 03/01/2022 4.6  3.5 - 5.3 mmol/L Final   Chloride 03/01/2022 104  98 - 110 mmol/L Final   CO2 03/01/2022 30  20 - 32 mmol/L Final   Calcium 03/01/2022 10.1  8.6 - 10.3 mg/dL Final   Total Protein 03/01/2022 6.8  6.1 - 8.1 g/dL Final   Albumin 03/01/2022 4.4  3.6 - 5.1 g/dL Final   Globulin 03/01/2022 2.4  1.9 - 3.7 g/dL (calc) Final   AG Ratio 03/01/2022 1.8  1.0 - 2.5 (calc) Final   Total Bilirubin  03/01/2022 0.3  0.2 - 1.2 mg/dL Final   Alkaline phosphatase (APISO) 03/01/2022 94  35 - 144 U/L Final   AST 03/01/2022 19  10 - 35 U/L Final   ALT 03/01/2022 15  9 - 46 U/L Final   Cholesterol 03/01/2022 202 (H)  <200 mg/dL Final   HDL 03/01/2022 37 (L)  > OR = 40 mg/dL Final   Triglycerides 03/01/2022 246 (H)  <150 mg/dL Final   Comment: . If a non-fasting specimen was collected, consider repeat triglyceride testing on a fasting specimen if clinically indicated.  Yates Decamp et al. J. of Clin. Lipidol. 1610;9:604-540. Marland Kitchen    LDL Cholesterol (Calc) 03/01/2022 125 (H)  mg/dL (calc) Final   Comment: Reference range: <100 . Desirable range <100 mg/dL for primary prevention;   <70 mg/dL for patients with CHD or diabetic patients  with > or = 2 CHD risk factors. Marland Kitchen LDL-C is now calculated using the Martin-Hopkins  calculation, which is a validated novel method providing  better accuracy than the Friedewald equation in the  estimation of LDL-C.  Cresenciano Genre et al. Annamaria Helling. 9811;914(78): 2061-2068  (http://education.[facility].com/faq/FAQ164)    Total CHOL/HDL Ratio 03/01/2022 5.5 (H)  <5.0 (calc) Final   Non-HDL Cholesterol (Calc) 03/01/2022 165 (H)  <130 mg/dL (calc) Final   Comment: For patients with diabetes plus 1 major ASCVD risk  factor, treating to a non-HDL-C goal of <100 mg/dL  (LDL-C of <70 mg/dL) is considered a therapeutic  option.    PSA 03/01/2022 <0.04  < OR = 4.00 ng/mL Final   Comment: The total PSA value from this assay system is  standardized against the WHO standard. The test  result will be approximately 20% lower when compared  to the equimolar-standardized total PSA (Beckman  Coulter). Comparison of serial PSA results should be  interpreted with this fact in mind. . This test was performed using the Siemens  chemiluminescent method. Values obtained from  different assay methods cannot be used interchangeably. PSA levels, regardless of value, should not be  interpreted as absolute evidence of the presence or absence of disease.     Immunization History  Administered Date(s) Administered   Influenza, High Dose Seasonal PF 02/23/2016, 01/05/2017, 01/12/2019   Influenza,inj,Quad PF,6+ Mos 03/10/2015, 02/08/2018   Influenza-Unspecified 01/04/2020, 01/16/2021, 02/17/2022   PFIZER(Purple Top)SARS-COV-2 Vaccination 09/12/2019, 10/02/2019, 04/04/2020, 08/16/2020, 02/11/2021   Pfizer Covid-19 Office manager  1yr & up 08/16/2020, 02/11/2021, 11/30/2021   Pneumococcal Conjugate-13 11/07/2014, 01/05/2017   Pneumococcal Polysaccharide-23 03/17/2016   Tdap 03/11/2015   Zoster Recombinat (Shingrix) 05/29/2018, 09/26/2018   Lab on 03/01/2022  Component Date Value Ref Range Status   Hgb A1c MFr Bld 03/01/2022 5.6  <5.7 % of total Hgb Final   Comment: For the purpose of screening for the presence of diabetes: . <5.7%       Consistent with the absence of diabetes 5.7-6.4%    Consistent with increased risk for diabetes             (prediabetes) > or =6.5%  Consistent with diabetes . This assay result is consistent with a decreased risk of diabetes. . Currently, no consensus exists regarding use of hemoglobin A1c for diagnosis of diabetes in children. . According to American Diabetes Association (ADA) guidelines, hemoglobin A1c <7.0% represents optimal control in non-pregnant diabetic patients. Different metrics may apply to specific patient populations.  Standards of Medical Care in Diabetes(ADA). .    Mean Plasma Glucose 03/01/2022 114  mg/dL Final   eAG (mmol/L) 03/01/2022 6.3  mmol/L Final   WBC 03/01/2022 5.4  3.8 - 10.8 Thousand/uL Final   RBC 03/01/2022 4.74  4.20 - 5.80 Million/uL Final   Hemoglobin 03/01/2022 14.9  13.2 - 17.1 g/dL Final   HCT 03/01/2022 43.5  38.5 - 50.0 % Final   MCV 03/01/2022 91.8  80.0 - 100.0 fL Final   MCH 03/01/2022 31.4  27.0 - 33.0 pg Final   MCHC 03/01/2022 34.3  32.0 - 36.0 g/dL Final   RDW  03/01/2022 13.6  11.0 - 15.0 % Final   Platelets 03/01/2022 224  140 - 400 Thousand/uL Final   MPV 03/01/2022 10.0  7.5 - 12.5 fL Final   Neutro Abs 03/01/2022 2,641  1,500 - 7,800 cells/uL Final   Lymphs Abs 03/01/2022 1,939  850 - 3,900 cells/uL Final   Absolute Monocytes 03/01/2022 594  200 - 950 cells/uL Final   Eosinophils Absolute 03/01/2022 189  15 - 500 cells/uL Final   Basophils Absolute 03/01/2022 38  0 - 200 cells/uL Final   Neutrophils Relative % 03/01/2022 48.9  % Final   Total Lymphocyte 03/01/2022 35.9  % Final   Monocytes Relative 03/01/2022 11.0  % Final   Eosinophils Relative 03/01/2022 3.5  % Final   Basophils Relative 03/01/2022 0.7  % Final   Glucose, Bld 03/01/2022 89  65 - 99 mg/dL Final   Comment: .            Fasting reference interval .    BUN 03/01/2022 16  7 - 25 mg/dL Final   Creat 03/01/2022 1.03  0.70 - 1.28 mg/dL Final   BUN/Creatinine Ratio 03/01/2022 SEE NOTE:  6 - 22 (calc) Final   Comment:    Not Reported: BUN and Creatinine are within    reference range. .    Sodium 03/01/2022 143  135 - 146 mmol/L Final   Potassium 03/01/2022 4.6  3.5 - 5.3 mmol/L Final   Chloride 03/01/2022 104  98 - 110 mmol/L Final   CO2 03/01/2022 30  20 - 32 mmol/L Final   Calcium 03/01/2022 10.1  8.6 - 10.3 mg/dL Final   Total Protein 03/01/2022 6.8  6.1 - 8.1 g/dL Final   Albumin 03/01/2022 4.4  3.6 - 5.1 g/dL Final   Globulin 03/01/2022 2.4  1.9 - 3.7 g/dL (calc) Final   AG Ratio 03/01/2022 1.8  1.0 - 2.5 (calc) Final  Total Bilirubin 03/01/2022 0.3  0.2 - 1.2 mg/dL Final   Alkaline phosphatase (APISO) 03/01/2022 94  35 - 144 U/L Final   AST 03/01/2022 19  10 - 35 U/L Final   ALT 03/01/2022 15  9 - 46 U/L Final   Cholesterol 03/01/2022 202 (H)  <200 mg/dL Final   HDL 03/01/2022 37 (L)  > OR = 40 mg/dL Final   Triglycerides 03/01/2022 246 (H)  <150 mg/dL Final   Comment: . If a non-fasting specimen was collected, consider repeat triglyceride testing on a fasting  specimen if clinically indicated.  Yates Decamp et al. J. of Clin. Lipidol. 4627;0:350-093. Marland Kitchen    LDL Cholesterol (Calc) 03/01/2022 125 (H)  mg/dL (calc) Final   Comment: Reference range: <100 . Desirable range <100 mg/dL for primary prevention;   <70 mg/dL for patients with CHD or diabetic patients  with > or = 2 CHD risk factors. Marland Kitchen LDL-C is now calculated using the Martin-Hopkins  calculation, which is a validated novel method providing  better accuracy than the Friedewald equation in the  estimation of LDL-C.  Cresenciano Genre et al. Annamaria Helling. 8182;993(71): 2061-2068  (http://education.[facility].com/faq/FAQ164)    Total CHOL/HDL Ratio 03/01/2022 5.5 (H)  <5.0 (calc) Final   Non-HDL Cholesterol (Calc) 03/01/2022 165 (H)  <130 mg/dL (calc) Final   Comment: For patients with diabetes plus 1 major ASCVD risk  factor, treating to a non-HDL-C goal of <100 mg/dL  (LDL-C of <70 mg/dL) is considered a therapeutic  option.    PSA 03/01/2022 <0.04  < OR = 4.00 ng/mL Final   Comment: The total PSA value from this assay system is  standardized against the WHO standard. The test  result will be approximately 20% lower when compared  to the equimolar-standardized total PSA (Beckman  Coulter). Comparison of serial PSA results should be  interpreted with this fact in mind. . This test was performed using the Siemens  chemiluminescent method. Values obtained from  different assay methods cannot be used interchangeably. PSA levels, regardless of value, should not be interpreted as absolute evidence of the presence or absence of disease.      Past Medical History:  Diagnosis Date   Allergy 2014   Arthritis    [employer] 02/16/2020   Cancer ([place]) 2015   prostate cancer   Cataract 2016   COVID-19 05/2019   COVID-19 05/2019   DDD (degenerative disc disease), lumbar 12/24/2014   GERD (gastroesophageal reflux disease)    Glaucoma    Hx of colonic polyps 02/19/2011   Hyperlipidemia 11/07/2014    Peripheral neuropathy 12/24/2014   Past Surgical History:  Procedure Laterality Date   COLONOSCOPY     CRANIOTOMY N/A 10/20/2021   Procedure: Endoscopic transphenoidal resection of pituitary tumor;  Surgeon: Ashok Pall, MD;  Location: [facility];  Service: Neurosurgery;  Laterality: N/A;   EYE SURGERY     laser surgery to relieve pressure on eyes- both eyes   JOINT REPLACEMENT  2011   total shoulder replacement (Right)   LAPAROTOMY N/A 01/06/2018   Procedure: EXPLORATORY LAPAROTOMY;  Surgeon: Judeth Horn, MD;  Location: [place];  Service: General;  Laterality: N/A;   LYSIS OF ADHESION N/A 01/06/2018   Procedure: LYSIS OF ADHESION;  Surgeon: Judeth Horn, MD;  Location: [facility];  Service: General;  Laterality: N/A;   PROSTATE SURGERY     2015   SMALL INTESTINE SURGERY N/A    [employer]reesia 02/16/2020   TONSILLECTOMY     TOTAL SHOULDER ARTHROPLASTY Left 03/04/2020  Procedure: TOTAL SHOULDER ARTHROPLASTY;  Surgeon: Marchia Bond, MD;  Location: [facility];  Service: Orthopedics;  Laterality: Left;   TRANSPHENOIDAL APPROACH EXPOSURE N/A 10/20/2021   Procedure: TRANSPHENOIDAL APPROACH EXPOSURE;  Surgeon: Jerrell Belfast, MD;  Location: [place];  Service: ENT;  Laterality: N/A;   VASECTOMY  1982   Current Outpatient Medications on File Prior to Visit  Medication Sig Dispense Refill   aspirin 81 MG EC tablet Take 81 mg by mouth daily. Swallow whole.     atorvastatin (LIPITOR) 40 MG tablet TAKE 1 TABLET DAILY AT 6PM (Patient taking differently: Take 40 mg by mouth daily after supper.) 90 tablet 2   diphenhydrAMINE (BENADRYL) 25 MG tablet Take 25 mg by mouth daily as needed for allergies.     dorzolamide-timolol (COSOPT) 22.3-6.8 MG/ML ophthalmic solution Place 1 drop into both eyes 2 (two) times daily.      FIBER PO Take 1 capsule by mouth 2 (two) times daily.     fluticasone (FLONASE) 50 MCG/ACT nasal spray Place 2 sprays into both nostrils daily. 9.9 mL 2   gabapentin (NEURONTIN) 600 MG tablet Take 1 tablet  (600 mg total) by mouth in the morning and at bedtime. 180 tablet 0   metroNIDAZOLE (METROGEL) 0.75 % gel Apply 1 application. topically 2 (two) times daily. (Patient taking differently: Apply 1 application  topically See admin instructions. Apply to affected areas of the face 2 times a day) 45 g 11   Omega-3 Fatty Acids (OMEGA-3 FISH OIL) 300 MG CAPS Take 300 mg by mouth daily.     ondansetron (ZOFRAN) 4 MG tablet Take 1 tablet (4 mg total) by mouth every 8 (eight) hours as needed for nausea or vomiting. 20 tablet 0   ONE DAILY MULTIPLE VITAMIN PO Take 1 tablet by mouth daily.      traZODone (DESYREL) 50 MG tablet TAKE 1/2 TO 1 (ONE-HALF TO ONE) TABLET BY MOUTH AT BEDTIME AS NEEDED FOR SLEEP (Patient taking differently: Take 25-50 mg by mouth at bedtime as needed for sleep.) 90 tablet 1   vitamin C (ASCORBIC ACID) 500 MG tablet Take 500 mg by mouth daily.     VYZULTA 0.024 % SOLN Place 1 drop into both eyes at bedtime.     No current facility-administered medications on file prior to visit.   No Known Allergies Social History   Socioeconomic History   Marital status: Married    Spouse name: Not on file   Number of children: Not on file   Years of education: Not on file   Highest education level: Not on file  Occupational History   Not on file  Tobacco Use   Smoking status: Former    Types: Cigarettes    Quit date: 03/09/1981    Years since quitting: 41.0   Smokeless tobacco: Never  Vaping Use   Vaping Use: Never used  Substance and Sexual Activity   Alcohol use: Yes    Alcohol/week: 2.0 standard drinks of alcohol    Types: 2 Cans of beer per week    Comment: occasionally   Drug use: No   Sexual activity: Not Currently  Other Topics Concern   Not on file  Social History Narrative   Not on file   Social Determinants of Health   Financial Resource Strain: Not on file  Food Insecurity: Not on file  Transportation Needs: Not on file  Physical Activity: Not on file  Stress:  Not on file  Social Connections: Not on file  Intimate Partner Violence: Not on file   Family History  Problem Relation Age of Onset   Arthritis Mother    Heart disease Mother    Hyperlipidemia Mother    Hypertension Mother    Stroke Mother    Vision loss Mother    Arthritis Father    Depression Father    Heart disease Father    Hyperlipidemia Father    Stroke Father    Arthritis Sister    Cancer Sister    Diabetes Sister    Hyperlipidemia Sister    Arthritis Brother    Diabetes Brother    Heart disease Brother    Hyperlipidemia Brother    Hypertension Brother    Stomach cancer Maternal Grandfather    Colon cancer Neg Hx    Esophageal cancer Neg Hx    Rectal cancer Neg Hx      Review of Systems  All other systems reviewed and are negative.      Objective:   Physical Exam Vitals reviewed.  Constitutional:      General: He is not in acute distress.    Appearance: Normal appearance. He is not ill-appearing, toxic-appearing or diaphoretic.  HENT:     Head: Normocephalic and atraumatic.     Right Ear: Tympanic membrane, ear canal and external ear normal.     Left Ear: Tympanic membrane, ear canal and external ear normal.     Nose: Nose normal. No congestion or rhinorrhea.     Mouth/Throat:     Mouth: Mucous membranes are moist.     Pharynx: Oropharynx is clear. No oropharyngeal exudate or posterior oropharyngeal erythema.  Eyes:     General: No scleral icterus.       Right eye: No discharge.        Left eye: No discharge.     Extraocular Movements: Extraocular movements intact.     Conjunctiva/sclera: Conjunctivae normal.     Pupils: Pupils are equal, round, and reactive to light.  Neck:     Thyroid: Thyromegaly present.     Vascular: No carotid bruit.  Cardiovascular:     Rate and Rhythm: Normal rate and regular rhythm.     Pulses:          Dorsalis pedis pulses are 0 on the left side.     Heart sounds: Normal heart sounds. No murmur heard.    No  friction rub. No gallop.  Pulmonary:     Effort: Pulmonary effort is normal. No respiratory distress.     Breath sounds: Normal breath sounds. No stridor. No wheezing, rhonchi or rales.  Chest:     Chest wall: No tenderness.  Abdominal:     General: Abdomen is flat. Bowel sounds are normal. There is no distension.     Palpations: Abdomen is soft. There is no mass.     Tenderness: There is no abdominal tenderness. There is no right CVA tenderness, left CVA tenderness, guarding or rebound.     Hernia: No hernia is present.  Musculoskeletal:     Cervical back: Normal range of motion and neck supple.     Right lower leg: No edema.     Left lower leg: No edema.  Lymphadenopathy:     Cervical: No cervical adenopathy.  Skin:    General: Skin is warm.     Coloration: Skin is not jaundiced or pale.     Findings: No bruising, erythema, lesion or rash.  Neurological:     General: No focal deficit  present.     Mental Status: He is alert and oriented to person, place, and time. Mental status is at baseline.     Cranial Nerves: No cranial nerve deficit.     Sensory: No sensory deficit.     Motor: No weakness.     Coordination: Coordination normal.     Gait: Gait normal.     Deep Tendon Reflexes: Reflexes normal.  Psychiatric:        Mood and Affect: Mood normal.        Behavior: Behavior normal.        Thought Content: Thought content normal.        Judgment: Judgment normal.           Assessment & Plan:  Pituitary adenoma ([place]) - Plan: TSH  General medical exam  History of prostate cancer  Pure hypercholesterolemia We will switch Lipitor to Crestor 20 mg daily and recheck cholesterol in 3 months.  He will work on diet and exercise.  I recommended a COVID booster.  We discussed the pros and cons of an RSV vaccine.  Colonoscopy is up-to-date.  PSA is undetectable.  Follow-up in 3 months for repeat MRI of the brain.  Check a TSH to ensure that there is no evidence of hypothyroidism  after resection of the pituitary adenoma.  His sodium and potassium was normal and his blood pressure is stable so I see no evidence of adrenal insufficiency to suggest decreased production in ACTH.

## 2022-03-06 ENCOUNTER — Encounter: Payer: Self-pay | Admitting: Family Medicine

## 2022-03-06 LAB — CBC WITH DIFFERENTIAL/PLATELET
Absolute Monocytes: 594 cells/uL (ref 200–950)
Basophils Absolute: 38 cells/uL (ref 0–200)
Basophils Relative: 0.7 %
Eosinophils Absolute: 189 cells/uL (ref 15–500)
Eosinophils Relative: 3.5 %
HCT: 43.5 % (ref 38.5–50.0)
Hemoglobin: 14.9 g/dL (ref 13.2–17.1)
Lymphs Abs: 1939 cells/uL (ref 850–3900)
MCH: 31.4 pg (ref 27.0–33.0)
MCHC: 34.3 g/dL (ref 32.0–36.0)
MCV: 91.8 fL (ref 80.0–100.0)
MPV: 10 fL (ref 7.5–12.5)
Monocytes Relative: 11 %
Neutro Abs: 2641 cells/uL (ref 1500–7800)
Neutrophils Relative %: 48.9 %
Platelets: 224 10*3/uL (ref 140–400)
RBC: 4.74 10*6/uL (ref 4.20–5.80)
RDW: 13.6 % (ref 11.0–15.0)
Total Lymphocyte: 35.9 %
WBC: 5.4 10*3/uL (ref 3.8–10.8)

## 2022-03-06 LAB — COMPREHENSIVE METABOLIC PANEL
AG Ratio: 1.8 (calc) (ref 1.0–2.5)
ALT: 15 U/L (ref 9–46)
AST: 19 U/L (ref 10–35)
Albumin: 4.4 g/dL (ref 3.6–5.1)
Alkaline phosphatase (APISO): 94 U/L (ref 35–144)
BUN: 16 mg/dL (ref 7–25)
CO2: 30 mmol/L (ref 20–32)
Calcium: 10.1 mg/dL (ref 8.6–10.3)
Chloride: 104 mmol/L (ref 98–110)
Creat: 1.03 mg/dL (ref 0.70–1.28)
Globulin: 2.4 g/dL (calc) (ref 1.9–3.7)
Glucose, Bld: 89 mg/dL (ref 65–99)
Potassium: 4.6 mmol/L (ref 3.5–5.3)
Sodium: 143 mmol/L (ref 135–146)
Total Bilirubin: 0.3 mg/dL (ref 0.2–1.2)
Total Protein: 6.8 g/dL (ref 6.1–8.1)

## 2022-03-06 LAB — LIPID PANEL
Cholesterol: 202 mg/dL — ABNORMAL HIGH (ref <200)
HDL: 37 mg/dL — ABNORMAL LOW (ref >=40)
LDL Cholesterol (Calc): 125 mg/dL (calc) — ABNORMAL HIGH
Non-HDL Cholesterol (Calc): 165 mg/dL (calc) — ABNORMAL HIGH (ref <130)
Total CHOL/HDL Ratio: 5.5 (calc) — ABNORMAL HIGH (ref <5.0)
Triglycerides: 246 mg/dL — ABNORMAL HIGH (ref <150)

## 2022-03-06 LAB — HEMOGLOBIN A1C
Hgb A1c MFr Bld: 5.6 % of total Hgb (ref <5.7)
Mean Plasma Glucose: 114 mg/dL
eAG (mmol/L): 6.3 mmol/L

## 2022-03-06 LAB — PSA: PSA: <0.04 ng/mL (ref <=4.00)

## 2022-03-06 LAB — TSH: TSH: 2.61 mIU/L (ref 0.40–4.50)

## 2022-03-08 ENCOUNTER — Other Ambulatory Visit: Payer: Self-pay

## 2022-03-08 MED ORDER — ROSUVASTATIN CALCIUM 20 MG PO TABS: 20.0000 mg | ORAL_TABLET | Freq: Every day | ORAL | 3 refills | Status: DC

## 2022-03-09 ENCOUNTER — Encounter: Payer: Self-pay | Admitting: Family Medicine

## 2022-03-11 ENCOUNTER — Other Ambulatory Visit: Payer: Self-pay

## 2022-03-11 DIAGNOSIS — E7800 Pure hypercholesterolemia, unspecified: Secondary | ICD-10-CM

## 2022-03-11 MED ORDER — ROSUVASTATIN CALCIUM 20 MG PO TABS: 20.0000 mg | ORAL_TABLET | Freq: Every day | ORAL | 3 refills | Status: DC

## 2022-03-24 ENCOUNTER — Ambulatory Visit (INDEPENDENT_AMBULATORY_CARE_PROVIDER_SITE_OTHER): Payer: Medicare Other

## 2022-03-24 DIAGNOSIS — M722 Plantar fascial fibromatosis: Secondary | ICD-10-CM

## 2022-03-24 NOTE — Progress Notes (Signed)
Patient is having a hard time putting shoes on because of the type of top cover on the orthotics. Patient was also wearing orthotics with additional inserts. Patient was not aware that he should remove the inserts that comes with the shoes.   Will send orthotics back to [facility] to have top cover changed to spenco 81m black.   Will call patient when they are ready for pick-up.

## 2022-03-26 ENCOUNTER — Other Ambulatory Visit: Payer: Self-pay | Admitting: Neurosurgery

## 2022-03-26 DIAGNOSIS — D497 Neoplasm of unspecified behavior of endocrine glands and other parts of nervous system: Secondary | ICD-10-CM

## 2022-03-30 ENCOUNTER — Encounter: Payer: Self-pay | Admitting: Family Medicine

## 2022-03-30 ENCOUNTER — Ambulatory Visit: Payer: Medicare Other | Admitting: Podiatry

## 2022-03-31 ENCOUNTER — Telehealth: Payer: Self-pay

## 2022-03-31 NOTE — Telephone Encounter (Signed)
My Chart message from patient requesting a refill on his Trazodone and Gabapentin. Last OV was 03/04/2022. Last RF on Trazodone was 12/08/2020. Last Rf on Gabapentin was 12/01/2021. Pt uses [facility]. Thank you.

## 2022-04-01 ENCOUNTER — Other Ambulatory Visit: Payer: Self-pay | Admitting: Family Medicine

## 2022-04-01 DIAGNOSIS — G6289 Other specified polyneuropathies: Secondary | ICD-10-CM

## 2022-04-01 DIAGNOSIS — F5104 Psychophysiologic insomnia: Secondary | ICD-10-CM

## 2022-04-01 MED ORDER — TRAZODONE HCL 50 MG PO TABS: ORAL_TABLET | ORAL | 3 refills | Status: DC

## 2022-04-01 MED ORDER — GABAPENTIN 600 MG PO TABS: 600.0000 mg | ORAL_TABLET | Freq: Two times a day (BID) | ORAL | 6 refills | Status: DC

## 2022-04-07 ENCOUNTER — Telehealth: Payer: Self-pay | Admitting: Podiatry

## 2022-04-07 NOTE — Telephone Encounter (Signed)
Called patient and left him know that his orthotics are back in, he will stop by Monday 11/27 to pick them up .

## 2022-04-12 ENCOUNTER — Ambulatory Visit: Payer: Self-pay | Admitting: *Deleted

## 2022-04-12 NOTE — Progress Notes (Signed)
Patient presents today to pick up custom molded foot orthotics recommended by Dr. Milinda Pointer.   Orthotics were dispensed and fit was satisfactory. Reviewed instructions for break-in and wear. Written instructions given to patient.  Patient will follow up as needed.   [facility] - order # [id number]

## 2022-04-13 DIAGNOSIS — L578 Other skin changes due to chronic exposure to nonionizing radiation: Secondary | ICD-10-CM | POA: Diagnosis not present

## 2022-04-13 DIAGNOSIS — L570 Actinic keratosis: Secondary | ICD-10-CM | POA: Diagnosis not present

## 2022-04-18 ENCOUNTER — Ambulatory Visit
Admission: RE | Admit: 2022-04-18 | Discharge: 2022-04-18 | Disposition: A | Discharge status: Home / Self Care | Payer: Medicare Other | Source: Ambulatory Visit | Attending: Neurosurgery | Admitting: Neurosurgery

## 2022-04-18 DIAGNOSIS — D497 Neoplasm of unspecified behavior of endocrine glands and other parts of nervous system: Secondary | ICD-10-CM

## 2022-04-18 MED ORDER — GADOPICLENOL 0.5 MMOL/ML IV SOLN
8.0000 mL | Freq: Once | INTRAVENOUS | Status: AC | PRN
  Administered 2022-04-18: 8 mL via INTRAVENOUS

## 2022-05-03 DIAGNOSIS — D497 Neoplasm of unspecified behavior of endocrine glands and other parts of nervous system: Secondary | ICD-10-CM | POA: Diagnosis not present

## 2022-05-03 DIAGNOSIS — Z6826 Body mass index (BMI) 26.0-26.9, adult: Secondary | ICD-10-CM | POA: Diagnosis not present

## 2022-05-26 DIAGNOSIS — H401213 Low-tension glaucoma, right eye, severe stage: Secondary | ICD-10-CM | POA: Diagnosis not present

## 2022-05-31 ENCOUNTER — Other Ambulatory Visit: Payer: Medicare Other

## 2022-05-31 DIAGNOSIS — Z125 Encounter for screening for malignant neoplasm of prostate: Secondary | ICD-10-CM

## 2022-05-31 DIAGNOSIS — R972 Elevated prostate specific antigen [PSA]: Secondary | ICD-10-CM | POA: Diagnosis not present

## 2022-05-31 DIAGNOSIS — R739 Hyperglycemia, unspecified: Secondary | ICD-10-CM | POA: Diagnosis not present

## 2022-05-31 DIAGNOSIS — E663 Overweight: Secondary | ICD-10-CM

## 2022-05-31 DIAGNOSIS — N529 Male erectile dysfunction, unspecified: Secondary | ICD-10-CM

## 2022-05-31 DIAGNOSIS — D352 Benign neoplasm of pituitary gland: Secondary | ICD-10-CM | POA: Diagnosis not present

## 2022-05-31 DIAGNOSIS — G629 Polyneuropathy, unspecified: Secondary | ICD-10-CM

## 2022-05-31 DIAGNOSIS — E7800 Pure hypercholesterolemia, unspecified: Secondary | ICD-10-CM

## 2022-05-31 DIAGNOSIS — E782 Mixed hyperlipidemia: Secondary | ICD-10-CM

## 2022-06-01 LAB — COMPLETE METABOLIC PANEL WITH GFR
AG Ratio: 1.7 (calc) (ref 1.0–2.5)
ALT: 16 U/L (ref 9–46)
AST: 19 U/L (ref 10–35)
Albumin: 4.5 g/dL (ref 3.6–5.1)
Alkaline phosphatase (APISO): 84 U/L (ref 35–144)
BUN: 16 mg/dL (ref 7–25)
CO2: 30 mmol/L (ref 20–32)
Calcium: 10.9 mg/dL — ABNORMAL HIGH (ref 8.6–10.3)
Chloride: 103 mmol/L (ref 98–110)
Creat: 0.96 mg/dL (ref 0.70–1.28)
Globulin: 2.7 g/dL (calc) (ref 1.9–3.7)
Glucose, Bld: 86 mg/dL (ref 65–99)
Potassium: 4.7 mmol/L (ref 3.5–5.3)
Sodium: 144 mmol/L (ref 135–146)
Total Bilirubin: 0.4 mg/dL (ref 0.2–1.2)
Total Protein: 7.2 g/dL (ref 6.1–8.1)
eGFR: 84 mL/min/{1.73_m2} (ref >=60)

## 2022-06-01 LAB — LIPID PANEL
Cholesterol: 158 mg/dL (ref <200)
HDL: 41 mg/dL (ref >=40)
LDL Cholesterol (Calc): 87 mg/dL (calc)
Non-HDL Cholesterol (Calc): 117 mg/dL (calc) (ref <130)
Total CHOL/HDL Ratio: 3.9 (calc) (ref <5.0)
Triglycerides: 201 mg/dL — ABNORMAL HIGH (ref <150)

## 2022-06-01 LAB — CBC WITH DIFFERENTIAL/PLATELET
Absolute Monocytes: 633 cells/uL (ref 200–950)
Basophils Absolute: 51 cells/uL (ref 0–200)
Basophils Relative: 0.9 %
Eosinophils Absolute: 291 cells/uL (ref 15–500)
Eosinophils Relative: 5.1 %
HCT: 43.7 % (ref 38.5–50.0)
Hemoglobin: 14.8 g/dL (ref 13.2–17.1)
Lymphs Abs: 2314 cells/uL (ref 850–3900)
MCH: 30.1 pg (ref 27.0–33.0)
MCHC: 33.9 g/dL (ref 32.0–36.0)
MCV: 88.8 fL (ref 80.0–100.0)
MPV: 10.4 fL (ref 7.5–12.5)
Monocytes Relative: 11.1 %
Neutro Abs: 2411 cells/uL (ref 1500–7800)
Neutrophils Relative %: 42.3 %
Platelets: 233 10*3/uL (ref 140–400)
RBC: 4.92 10*6/uL (ref 4.20–5.80)
RDW: 13.1 % (ref 11.0–15.0)
Total Lymphocyte: 40.6 %
WBC: 5.7 10*3/uL (ref 3.8–10.8)

## 2022-06-01 LAB — HEMOGLOBIN A1C
Hgb A1c MFr Bld: 5.6 % of total Hgb (ref <5.7)
Mean Plasma Glucose: 114 mg/dL
eAG (mmol/L): 6.3 mmol/L

## 2022-06-01 LAB — PSA: PSA: <0.04 ng/mL (ref <=4.00)

## 2022-06-01 LAB — TSH: TSH: 2.93 mIU/L (ref 0.40–4.50)

## 2022-06-04 ENCOUNTER — Ambulatory Visit (INDEPENDENT_AMBULATORY_CARE_PROVIDER_SITE_OTHER): Payer: Medicare Other | Admitting: Family Medicine

## 2022-06-04 ENCOUNTER — Encounter: Payer: Self-pay | Admitting: Family Medicine

## 2022-06-04 VITALS — BP 122/70 | HR 51 | Temp 97.8°F | Ht 68.0 in | Wt 173.0 lb

## 2022-06-04 DIAGNOSIS — E349 Endocrine disorder, unspecified: Secondary | ICD-10-CM | POA: Diagnosis not present

## 2022-06-04 DIAGNOSIS — E7800 Pure hypercholesterolemia, unspecified: Secondary | ICD-10-CM

## 2022-06-04 DIAGNOSIS — L509 Urticaria, unspecified: Secondary | ICD-10-CM

## 2022-06-04 MED ORDER — ROSUVASTATIN CALCIUM 20 MG PO TABS: 20.0000 mg | ORAL_TABLET | Freq: Every day | ORAL | 3 refills | Status: DC

## 2022-06-04 MED ORDER — TRIAMCINOLONE ACETONIDE 0.1 % EX CREA: 1.0000 | TOPICAL_CREAM | Freq: Two times a day (BID) | CUTANEOUS | 0 refills | Status: DC

## 2022-06-04 NOTE — Progress Notes (Signed)
Subjective:    Patient ID: Kyle Wilcox, male    DOB: Jul 31, 1950, 72 y.o.   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  Repeat MRI in December showed no new growth on his pituitary gland.  Cholesterol last year was elevated so we switch Lipitor to Crestor and his most recent lab work is listed below.  Cholesterol is outstanding.  He does have some hives on his right clavicle and on his left upper arm.  He recently was caring for his son that may have had flea bites.  He denies any other hives anywhere else on his body.  There are approximately 6 in total.  We will try treating this with triamcinolone cream.  On his lab work there is an elevation in his calcium.  He is on vitamin D supplementation. Lab on 05/31/2022  Component Date Value Ref Range Status   WBC 05/31/2022 5.7  3.8 - 10.8 Thousand/uL Final   RBC 05/31/2022 4.92  4.20 - 5.80 Million/uL Final   Hemoglobin 05/31/2022 14.8  13.2 - 17.1 g/dL Final   HCT 05/31/2022 43.7  38.5 - 50.0 % Final   MCV 05/31/2022 88.8  80.0 - 100.0 fL Final   MCH 05/31/2022 30.1  27.0 - 33.0 pg Final   MCHC 05/31/2022 33.9  32.0 - 36.0 g/dL Final   RDW 05/31/2022 13.1  11.0 - 15.0 % Final   Platelets 05/31/2022 233  140 - 400 Thousand/uL Final   MPV 05/31/2022 10.4  7.5 - 12.5 fL Final   Neutro Abs 05/31/2022 2,411  1,500 - 7,800 cells/uL Final   Lymphs Abs 05/31/2022 2,314  850 - 3,900 cells/uL Final   Absolute Monocytes 05/31/2022 633  200 - 950 cells/uL Final   Eosinophils Absolute 05/31/2022 291  15 - 500 cells/uL Final   Basophils Absolute 05/31/2022 51  0 - 200 cells/uL Final   Neutrophils Relative % 05/31/2022 42.3  % Final   Total Lymphocyte 05/31/2022 40.6  % Final   Monocytes Relative 05/31/2022 11.1  % Final   Eosinophils Relative 05/31/2022 5.1  % Final   Basophils Relative 05/31/2022 0.9  % Final   Glucose, Bld  05/31/2022 86  65 - 99 mg/dL Final   Comment: .            Fasting reference interval .    BUN 05/31/2022 16  7 - 25 mg/dL Final   Creat 05/31/2022 0.96  0.70 - 1.28 mg/dL Final   eGFR 05/31/2022 84  > OR = 60 mL/min/1.62m Final   BUN/Creatinine Ratio 05/31/2022 SEE NOTE:  6 - 22 (calc) Final   Comment:    Not Reported: BUN and Creatinine are within    reference range. .    Sodium 05/31/2022 144  135 - 146 mmol/L Final   Potassium 05/31/2022 4.7  3.5 - 5.3 mmol/L Final   Chloride 05/31/2022 103  98 - 110 mmol/L Final   CO2 05/31/2022 30  20 - 32 mmol/L Final   Calcium 05/31/2022 10.9 (H)  8.6 - 10.3 mg/dL Final   Total Protein 05/31/2022 7.2  6.1 - 8.1 g/dL Final   Albumin 05/31/2022 4.5  3.6 - 5.1 g/dL Final   Globulin 05/31/2022 2.7  1.9 - 3.7 g/dL (calc) Final   AG Ratio 05/31/2022 1.7  1.0 - 2.5 (calc) Final   Total Bilirubin 05/31/2022 0.4  0.2 - 1.2 mg/dL Final  Alkaline phosphatase (APISO) 05/31/2022 84  35 - 144 U/L Final   AST 05/31/2022 19  10 - 35 U/L Final   ALT 05/31/2022 16  9 - 46 U/L Final   Hgb A1c MFr Bld 05/31/2022 5.6  <5.7 % of total Hgb Final   Comment: For the purpose of screening for the presence of diabetes: . <5.7%       Consistent with the absence of diabetes 5.7-6.4%    Consistent with increased risk for diabetes             (prediabetes) > or =6.5%  Consistent with diabetes . This assay result is consistent with a decreased risk of diabetes. . Currently, no consensus exists regarding use of hemoglobin A1c for diagnosis of diabetes in children. . According to American Diabetes Association (ADA) guidelines, hemoglobin A1c <7.0% represents optimal control in non-pregnant diabetic patients. Different metrics may apply to specific patient populations.  Standards of Medical Care in Diabetes(ADA). .    Mean Plasma Glucose 05/31/2022 114  mg/dL Final   eAG (mmol/L) 05/31/2022 6.3  mmol/L Final   Comment: . HbA1c performed on Roche  platform. Effective 02/22/22 a change in test platforms may have  shifted HbA1c results compared to historical results.    Cholesterol 05/31/2022 158  <200 mg/dL Final   HDL 05/31/2022 41  > OR = 40 mg/dL Final   Triglycerides 05/31/2022 201 (H)  <150 mg/dL Final   Comment: . If a non-fasting specimen was collected, consider repeat triglyceride testing on a fasting specimen if clinically indicated.  Yates Decamp et al. J. of Clin. Lipidol. 9562;1:308-657. Marland Kitchen    LDL Cholesterol (Calc) 05/31/2022 87  mg/dL (calc) Final   Comment: Reference range: <100 . Desirable range <100 mg/dL for primary prevention;   <70 mg/dL for patients with CHD or diabetic patients  with > or = 2 CHD risk factors. Marland Kitchen LDL-C is now calculated using the Martin-Hopkins  calculation, which is a validated novel method providing  better accuracy than the Friedewald equation in the  estimation of LDL-C.  Cresenciano Genre et al. Annamaria Helling. 8469;629(52): 2061-2068  (http://education.[facility].com/faq/FAQ164)    Total CHOL/HDL Ratio 05/31/2022 3.9  <5.0 (calc) Final   Non-HDL Cholesterol (Calc) 05/31/2022 117  <130 mg/dL (calc) Final   Comment: For patients with diabetes plus 1 major ASCVD risk  factor, treating to a non-HDL-C goal of <100 mg/dL  (LDL-C of <70 mg/dL) is considered a therapeutic  option.    TSH 05/31/2022 2.93  0.40 - 4.50 mIU/L Final   PSA 05/31/2022 <0.04  < OR = 4.00 ng/mL Final   Comment: The total PSA value from this assay system is  standardized against the WHO standard. The test  result will be approximately 20% lower when compared  to the equimolar-standardized total PSA (Beckman  Coulter). Comparison of serial PSA results should be  interpreted with this fact in mind. . This test was performed using the Siemens  chemiluminescent method. Values obtained from  different assay methods cannot be used interchangeably. PSA levels, regardless of value, should not be interpreted as absolute evidence  of the presence or absence of disease.     Immunization History  Administered Date(s) Administered   Influenza, High Dose Seasonal PF 02/23/2016, 01/05/2017, 01/12/2019   Influenza,inj,Quad PF,6+ Mos 03/10/2015, 02/08/2018   Influenza-Unspecified 01/04/2020, 01/16/2021, 02/17/2022   PFIZER(Purple Top)SARS-COV-2 Vaccination 09/12/2019, 10/02/2019, 04/04/2020, 08/16/2020, 02/11/2021   Pfizer Covid-19 Vaccine Bivalent Booster 3yr & up 08/16/2020, 02/11/2021, 11/30/2021   Pneumococcal Conjugate-13 11/07/2014, 01/05/2017  Pneumococcal Polysaccharide-23 03/17/2016   Tdap 03/11/2015   Zoster Recombinat (Shingrix) 05/29/2018, 09/26/2018   Lab on 05/31/2022  Component Date Value Ref Range Status   WBC 05/31/2022 5.7  3.8 - 10.8 Thousand/uL Final   RBC 05/31/2022 4.92  4.20 - 5.80 Million/uL Final   Hemoglobin 05/31/2022 14.8  13.2 - 17.1 g/dL Final   HCT 05/31/2022 43.7  38.5 - 50.0 % Final   MCV 05/31/2022 88.8  80.0 - 100.0 fL Final   MCH 05/31/2022 30.1  27.0 - 33.0 pg Final   MCHC 05/31/2022 33.9  32.0 - 36.0 g/dL Final   RDW 05/31/2022 13.1  11.0 - 15.0 % Final   Platelets 05/31/2022 233  140 - 400 Thousand/uL Final   MPV 05/31/2022 10.4  7.5 - 12.5 fL Final   Neutro Abs 05/31/2022 2,411  1,500 - 7,800 cells/uL Final   Lymphs Abs 05/31/2022 2,314  850 - 3,900 cells/uL Final   Absolute Monocytes 05/31/2022 633  200 - 950 cells/uL Final   Eosinophils Absolute 05/31/2022 291  15 - 500 cells/uL Final   Basophils Absolute 05/31/2022 51  0 - 200 cells/uL Final   Neutrophils Relative % 05/31/2022 42.3  % Final   Total Lymphocyte 05/31/2022 40.6  % Final   Monocytes Relative 05/31/2022 11.1  % Final   Eosinophils Relative 05/31/2022 5.1  % Final   Basophils Relative 05/31/2022 0.9  % Final   Glucose, Bld 05/31/2022 86  65 - 99 mg/dL Final   Comment: .            Fasting reference interval .    BUN 05/31/2022 16  7 - 25 mg/dL Final   Creat 05/31/2022 0.96  0.70 - 1.28 mg/dL Final    eGFR 05/31/2022 84  > OR = 60 mL/min/1.47m Final   BUN/Creatinine Ratio 05/31/2022 SEE NOTE:  6 - 22 (calc) Final   Comment:    Not Reported: BUN and Creatinine are within    reference range. .    Sodium 05/31/2022 144  135 - 146 mmol/L Final   Potassium 05/31/2022 4.7  3.5 - 5.3 mmol/L Final   Chloride 05/31/2022 103  98 - 110 mmol/L Final   CO2 05/31/2022 30  20 - 32 mmol/L Final   Calcium 05/31/2022 10.9 (H)  8.6 - 10.3 mg/dL Final   Total Protein 05/31/2022 7.2  6.1 - 8.1 g/dL Final   Albumin 05/31/2022 4.5  3.6 - 5.1 g/dL Final   Globulin 05/31/2022 2.7  1.9 - 3.7 g/dL (calc) Final   AG Ratio 05/31/2022 1.7  1.0 - 2.5 (calc) Final   Total Bilirubin 05/31/2022 0.4  0.2 - 1.2 mg/dL Final   Alkaline phosphatase (APISO) 05/31/2022 84  35 - 144 U/L Final   AST 05/31/2022 19  10 - 35 U/L Final   ALT 05/31/2022 16  9 - 46 U/L Final   Hgb A1c MFr Bld 05/31/2022 5.6  <5.7 % of total Hgb Final   Comment: For the purpose of screening for the presence of diabetes: . <5.7%       Consistent with the absence of diabetes 5.7-6.4%    Consistent with increased risk for diabetes             (prediabetes) > or =6.5%  Consistent with diabetes . This assay result is consistent with a decreased risk of diabetes. . Currently, no consensus exists regarding use of hemoglobin A1c for diagnosis of diabetes in children. . According to American Diabetes Association (ADA) guidelines,  hemoglobin A1c <7.0% represents optimal control in non-pregnant diabetic patients. Different metrics may apply to specific patient populations.  Standards of Medical Care in Diabetes(ADA). .    Mean Plasma Glucose 05/31/2022 114  mg/dL Final   eAG (mmol/L) 05/31/2022 6.3  mmol/L Final   Comment: . HbA1c performed on Roche platform. Effective 02/22/22 a change in test platforms may have  shifted HbA1c results compared to historical results.    Cholesterol 05/31/2022 158  <200 mg/dL Final   HDL 05/31/2022 41  > OR =  40 mg/dL Final   Triglycerides 05/31/2022 201 (H)  <150 mg/dL Final   Comment: . If a non-fasting specimen was collected, consider repeat triglyceride testing on a fasting specimen if clinically indicated.  Yates Decamp et al. J. of Clin. Lipidol. 3762;8:315-176. Marland Kitchen    LDL Cholesterol (Calc) 05/31/2022 87  mg/dL (calc) Final   Comment: Reference range: <100 . Desirable range <100 mg/dL for primary prevention;   <70 mg/dL for patients with CHD or diabetic patients  with > or = 2 CHD risk factors. Marland Kitchen LDL-C is now calculated using the Martin-Hopkins  calculation, which is a validated novel method providing  better accuracy than the Friedewald equation in the  estimation of LDL-C.  Cresenciano Genre et al. Annamaria Helling. 1607;371(06): 2061-2068  (http://education.[facility].com/faq/FAQ164)    Total CHOL/HDL Ratio 05/31/2022 3.9  <5.0 (calc) Final   Non-HDL Cholesterol (Calc) 05/31/2022 117  <130 mg/dL (calc) Final   Comment: For patients with diabetes plus 1 major ASCVD risk  factor, treating to a non-HDL-C goal of <100 mg/dL  (LDL-C of <70 mg/dL) is considered a therapeutic  option.    TSH 05/31/2022 2.93  0.40 - 4.50 mIU/L Final   PSA 05/31/2022 <0.04  < OR = 4.00 ng/mL Final   Comment: The total PSA value from this assay system is  standardized against the WHO standard. The test  result will be approximately 20% lower when compared  to the equimolar-standardized total PSA (Beckman  Coulter). Comparison of serial PSA results should be  interpreted with this fact in mind. . This test was performed using the Siemens  chemiluminescent method. Values obtained from  different assay methods cannot be used interchangeably. PSA levels, regardless of value, should not be interpreted as absolute evidence of the presence or absence of disease.      Past Medical History:  Diagnosis Date   Allergy 2014   Arthritis    [employer] 02/16/2020   Cancer ([place]) 2015   prostate cancer   Cataract 2016    COVID-19 05/2019   COVID-19 05/2019   DDD (degenerative disc disease), lumbar 12/24/2014   GERD (gastroesophageal reflux disease)    Glaucoma    Hx of colonic polyps 02/19/2011   Hyperlipidemia 11/07/2014   Peripheral neuropathy 12/24/2014   Past Surgical History:  Procedure Laterality Date   COLONOSCOPY     CRANIOTOMY N/A 10/20/2021   Procedure: Endoscopic transphenoidal resection of pituitary tumor;  Surgeon: Ashok Pall, MD;  Location: [facility];  Service: Neurosurgery;  Laterality: N/A;   EYE SURGERY     laser surgery to relieve pressure on eyes- both eyes   JOINT REPLACEMENT  2011   total shoulder replacement (Right)   LAPAROTOMY N/A 01/06/2018   Procedure: EXPLORATORY LAPAROTOMY;  Surgeon: Judeth Horn, MD;  Location: [facility];  Service: General;  Laterality: N/A;   LYSIS OF ADHESION N/A 01/06/2018   Procedure: LYSIS OF ADHESION;  Surgeon: Judeth Horn, MD;  Location: [facility];  Service: General;  Laterality:  N/A;   PROSTATE SURGERY     2015   SMALL INTESTINE SURGERY N/A    [employer] 02/16/2020   TONSILLECTOMY     TOTAL SHOULDER ARTHROPLASTY Left 03/04/2020   Procedure: TOTAL SHOULDER ARTHROPLASTY;  Surgeon: Marchia Bond, MD;  Location: [facility];  Service: Orthopedics;  Laterality: Left;   TRANSPHENOIDAL APPROACH EXPOSURE N/A 10/20/2021   Procedure: TRANSPHENOIDAL APPROACH EXPOSURE;  Surgeon: Jerrell Belfast, MD;  Location: [facility];  Service: ENT;  Laterality: N/A;   VASECTOMY  1982   Current Outpatient Medications on File Prior to Visit  Medication Sig Dispense Refill   aspirin 81 MG EC tablet Take 81 mg by mouth daily. Swallow whole.     diphenhydrAMINE (BENADRYL) 25 MG tablet Take 25 mg by mouth daily as needed for allergies.     dorzolamide-timolol (COSOPT) 22.3-6.8 MG/ML ophthalmic solution Place 1 drop into both eyes 2 (two) times daily.      FIBER PO Take 1 capsule by mouth 2 (two) times daily.     fluticasone (FLONASE) 50 MCG/ACT nasal spray Place 2 sprays into both nostrils daily. 9.9  mL 2   gabapentin (NEURONTIN) 600 MG tablet Take 1 tablet (600 mg total) by mouth in the morning and at bedtime. 180 tablet 6   metroNIDAZOLE (METROGEL) 0.75 % gel Apply 1 application. topically 2 (two) times daily. (Patient taking differently: Apply 1 application  topically See admin instructions. Apply to affected areas of the face 2 times a day) 45 g 11   Omega-3 Fatty Acids (OMEGA-3 FISH OIL) 300 MG CAPS Take 300 mg by mouth daily.     ondansetron (ZOFRAN) 4 MG tablet Take 1 tablet (4 mg total) by mouth every 8 (eight) hours as needed for nausea or vomiting. 20 tablet 0   ONE DAILY MULTIPLE VITAMIN PO Take 1 tablet by mouth daily.      traZODone (DESYREL) 50 MG tablet TAKE 1/2 TO 1 (ONE-HALF TO ONE) TABLET BY MOUTH AT BEDTIME AS NEEDED FOR SLEEP 90 tablet 3   vitamin C (ASCORBIC ACID) 500 MG tablet Take 500 mg by mouth daily.     No current facility-administered medications on file prior to visit.   No Known Allergies Social History   Socioeconomic History   Marital status: Married    Spouse name: Not on file   Number of children: Not on file   Years of education: Not on file   Highest education level: Not on file  Occupational History   Not on file  Tobacco Use   Smoking status: Former    Types: Cigarettes    Quit date: 03/09/1981    Years since quitting: 41.2   Smokeless tobacco: Never  Vaping Use   Vaping Use: Never used  Substance and Sexual Activity   Alcohol use: Yes    Alcohol/week: 2.0 standard drinks of alcohol    Types: 2 Cans of beer per week    Comment: occasionally   Drug use: No   Sexual activity: Not Currently  Other Topics Concern   Not on file  Social History Narrative   Not on file   Social Determinants of Health   Financial Resource Strain: Not on file  Food Insecurity: Not on file  Transportation Needs: Not on file  Physical Activity: Not on file  Stress: Not on file  Social Connections: Not on file  Intimate Partner Violence: Not on file    Family History  Problem Relation Age of Onset   Arthritis Mother  Heart disease Mother    Hyperlipidemia Mother    Hypertension Mother    Stroke Mother    Vision loss Mother    Arthritis Father    Depression Father    Heart disease Father    Hyperlipidemia Father    Stroke Father    Arthritis Sister    Cancer Sister    Diabetes Sister    Hyperlipidemia Sister    Arthritis Brother    Diabetes Brother    Heart disease Brother    Hyperlipidemia Brother    Hypertension Brother    Stomach cancer Maternal Grandfather    Colon cancer Neg Hx    Esophageal cancer Neg Hx    Rectal cancer Neg Hx      Review of Systems  All other systems reviewed and are negative.      Objective:   Physical Exam Vitals reviewed.  Constitutional:      General: He is not in acute distress.    Appearance: Normal appearance. He is not ill-appearing, toxic-appearing or diaphoretic.  HENT:     Head: Normocephalic and atraumatic.     Right Ear: Tympanic membrane, ear canal and external ear normal.     Left Ear: Tympanic membrane, ear canal and external ear normal.     Nose: Nose normal. No congestion or rhinorrhea.     Mouth/Throat:     Mouth: Mucous membranes are moist.     Pharynx: Oropharynx is clear. No oropharyngeal exudate or posterior oropharyngeal erythema.  Eyes:     General: No scleral icterus.       Right eye: No discharge.        Left eye: No discharge.     Extraocular Movements: Extraocular movements intact.     Conjunctiva/sclera: Conjunctivae normal.     Pupils: Pupils are equal, round, and reactive to light.  Neck:     Thyroid: Thyromegaly present.     Vascular: No carotid bruit.  Cardiovascular:     Rate and Rhythm: Normal rate and regular rhythm.     Pulses:          Dorsalis pedis pulses are 0 on the left side.     Heart sounds: Normal heart sounds. No murmur heard.    No friction rub. No gallop.  Pulmonary:     Effort: Pulmonary effort is normal. No respiratory  distress.     Breath sounds: Normal breath sounds. No stridor. No wheezing, rhonchi or rales.  Chest:     Chest wall: No tenderness.  Abdominal:     General: Abdomen is flat. Bowel sounds are normal. There is no distension.     Palpations: Abdomen is soft. There is no mass.     Tenderness: There is no abdominal tenderness. There is no right CVA tenderness, left CVA tenderness, guarding or rebound.     Hernia: No hernia is present.  Musculoskeletal:     Cervical back: Normal range of motion and neck supple.     Right lower leg: No edema.     Left lower leg: No edema.  Lymphadenopathy:     Cervical: No cervical adenopathy.  Skin:    General: Skin is warm.     Coloration: Skin is not jaundiced or pale.     Findings: No bruising, erythema, lesion or rash.  Neurological:     General: No focal deficit present.     Mental Status: He is alert and oriented to person, place, and time. Mental status is at baseline.  Cranial Nerves: No cranial nerve deficit.     Sensory: No sensory deficit.     Motor: No weakness.     Coordination: Coordination normal.     Gait: Gait normal.     Deep Tendon Reflexes: Reflexes normal.  Psychiatric:        Mood and Affect: Mood normal.        Behavior: Behavior normal.        Thought Content: Thought content normal.        Judgment: Judgment normal.           Assessment & Plan:  Hives  Pure hypercholesterolemia - Plan: rosuvastatin (CRESTOR) 20 MG tablet  Elevated calcitonin level Treat the hives with triamcinolone cream.  If spreading consider an allergic reaction to some ingestion but I suspect insect bites.  Continue Crestor his cholesterol is outstanding.  Discontinue vitamin D and recheck calcium level in 1 month.  If persistently elevated check PTH for hyperparathyroidism

## 2022-06-10 ENCOUNTER — Ambulatory Visit (INDEPENDENT_AMBULATORY_CARE_PROVIDER_SITE_OTHER): Payer: Medicare Other | Admitting: Family Medicine

## 2022-06-10 VITALS — BP 118/62 | HR 51 | Temp 97.7°F | Ht 68.0 in | Wt 175.0 lb

## 2022-06-10 DIAGNOSIS — L509 Urticaria, unspecified: Secondary | ICD-10-CM | POA: Diagnosis not present

## 2022-06-10 MED ORDER — LORATADINE 10 MG PO TABS: 10.0000 mg | ORAL_TABLET | Freq: Every day | ORAL | 11 refills | Status: AC

## 2022-06-10 MED ORDER — PREDNISONE 20 MG PO TABS: ORAL_TABLET | ORAL | 0 refills | Status: DC

## 2022-06-10 NOTE — Progress Notes (Signed)
Subjective:    Patient ID: Kyle Wilcox, male    DOB: 03-04-1951, 72 y.o.   MRN: [id number]  Rash   patient is a very pleasant 72 year old Caucasian gentleman who underwent resection of a neuroendocrine tumor from his pituitary gland last year.  Repeat MRI in December showed no new growth on his pituitary gland.  Patient was seen recently and was started on triamcinolone for hives.  The rash has not improved.  He continues to look.  As for hives on his left middle abdomen.  He has another hive on his right forearm.  He also has hives on his left forearm.  Each is approximately 6 4 to 6 mm in diameter.  It is a raised welt.  It itches     Past Medical History:  Diagnosis Date   Allergy 2014   Arthritis    Phreesia 02/16/2020   Cancer ([place]) 2015   prostate cancer   Cataract 2016   COVID-19 05/2019   COVID-19 05/2019   DDD (degenerative disc disease), lumbar 12/24/2014   GERD (gastroesophageal reflux disease)    Glaucoma    Hx of colonic polyps 02/19/2011   Hyperlipidemia 11/07/2014   Peripheral neuropathy 12/24/2014   Past Surgical History:  Procedure Laterality Date   COLONOSCOPY     CRANIOTOMY N/A 10/20/2021   Procedure: Endoscopic transphenoidal resection of pituitary tumor;  Surgeon: Ashok Pall, MD;  Location: [place];  Service: Neurosurgery;  Laterality: N/A;   EYE SURGERY     laser surgery to relieve pressure on eyes- both eyes   JOINT REPLACEMENT  2011   total shoulder replacement (Right)   LAPAROTOMY N/A 01/06/2018   Procedure: EXPLORATORY LAPAROTOMY;  Surgeon: Judeth Horn, MD;  Location: [place];  Service: General;  Laterality: N/A;   LYSIS OF ADHESION N/A 01/06/2018   Procedure: LYSIS OF ADHESION;  Surgeon: Judeth Horn, MD;  Location: [place];  Service: General;  Laterality: N/A;   PROSTATE SURGERY     2015   SMALL INTESTINE SURGERY N/A    Phreesia 02/16/2020   TONSILLECTOMY     TOTAL SHOULDER ARTHROPLASTY Left 03/04/2020   Procedure: TOTAL SHOULDER ARTHROPLASTY;   Surgeon: Marchia Bond, MD;  Location: [facility];  Service: Orthopedics;  Laterality: Left;   TRANSPHENOIDAL APPROACH EXPOSURE N/A 10/20/2021   Procedure: TRANSPHENOIDAL APPROACH EXPOSURE;  Surgeon: Jerrell Belfast, MD;  Location: [place];  Service: ENT;  Laterality: N/A;   VASECTOMY  1982   Current Outpatient Medications on File Prior to Visit  Medication Sig Dispense Refill   aspirin 81 MG EC tablet Take 81 mg by mouth daily. Swallow whole.     diphenhydrAMINE (BENADRYL) 25 MG tablet Take 25 mg by mouth daily as needed for allergies.     dorzolamide-timolol (COSOPT) 22.3-6.8 MG/ML ophthalmic solution Place 1 drop into both eyes 2 (two) times daily.      FIBER PO Take 1 capsule by mouth 2 (two) times daily.     fluticasone (FLONASE) 50 MCG/ACT nasal spray Place 2 sprays into both nostrils daily. 9.9 mL 2   gabapentin (NEURONTIN) 600 MG tablet Take 1 tablet (600 mg total) by mouth in the morning and at bedtime. 180 tablet 6   metroNIDAZOLE (METROGEL) 0.75 % gel Apply 1 application. topically 2 (two) times daily. (Patient taking differently: Apply 1 application  topically See admin instructions. Apply to affected areas of the face 2 times a day) 45 g 11   Omega-3 Fatty Acids (OMEGA-3 FISH OIL) 300 MG CAPS  Take 300 mg by mouth daily.     ondansetron (ZOFRAN) 4 MG tablet Take 1 tablet (4 mg total) by mouth every 8 (eight) hours as needed for nausea or vomiting. 20 tablet 0   ONE DAILY MULTIPLE VITAMIN PO Take 1 tablet by mouth daily.      rosuvastatin (CRESTOR) 20 MG tablet Take 1 tablet (20 mg total) by mouth daily. 90 tablet 3   traZODone (DESYREL) 50 MG tablet TAKE 1/2 TO 1 (ONE-HALF TO ONE) TABLET BY MOUTH AT BEDTIME AS NEEDED FOR SLEEP 90 tablet 3   triamcinolone cream (KENALOG) 0.1 % Apply 1 Application topically 2 (two) times daily. 30 g 0   vitamin C (ASCORBIC ACID) 500 MG tablet Take 500 mg by mouth daily.     No current facility-administered medications on file prior to visit.   No Known  Allergies Social History   Socioeconomic History   Marital status: Married    Spouse name: Not on file   Number of children: Not on file   Years of education: Not on file   Highest education level: Not on file  Occupational History   Not on file  Tobacco Use   Smoking status: Former    Types: Cigarettes    Quit date: 03/09/1981    Years since quitting: 41.2   Smokeless tobacco: Never  Vaping Use   Vaping Use: Never used  Substance and Sexual Activity   Alcohol use: Yes    Alcohol/week: 2.0 standard drinks of alcohol    Types: 2 Cans of beer per week    Comment: occasionally   Drug use: No   Sexual activity: Not Currently  Other Topics Concern   Not on file  Social History Narrative   Not on file   Social Determinants of Health   Financial Resource Strain: Not on file  Food Insecurity: Not on file  Transportation Needs: Not on file  Physical Activity: Not on file  Stress: Not on file  Social Connections: Not on file  Intimate Partner Violence: Not on file   Family History  Problem Relation Age of Onset   Arthritis Mother    Heart disease Mother    Hyperlipidemia Mother    Hypertension Mother    Stroke Mother    Vision loss Mother    Arthritis Father    Depression Father    Heart disease Father    Hyperlipidemia Father    Stroke Father    Arthritis Sister    Cancer Sister    Diabetes Sister    Hyperlipidemia Sister    Arthritis Brother    Diabetes Brother    Heart disease Brother    Hyperlipidemia Brother    Hypertension Brother    Stomach cancer Maternal Grandfather    Colon cancer Neg Hx    Esophageal cancer Neg Hx    Rectal cancer Neg Hx      Review of Systems  Skin:  Positive for rash.  All other systems reviewed and are negative.      Objective:   Physical Exam Vitals reviewed.  Constitutional:      General: He is not in acute distress.    Appearance: Normal appearance. He is not ill-appearing, toxic-appearing or diaphoretic.  HENT:      Head: Normocephalic and atraumatic.  Neck:     Thyroid: Thyromegaly present.  Cardiovascular:     Rate and Rhythm: Normal rate and regular rhythm.     Pulses:  Dorsalis pedis pulses are 0 on the left side.     Heart sounds: Normal heart sounds. No murmur heard.    No friction rub. No gallop.  Pulmonary:     Effort: Pulmonary effort is normal. No respiratory distress.     Breath sounds: Normal breath sounds. No stridor. No wheezing, rhonchi or rales.  Chest:     Chest wall: No tenderness.  Skin:    General: Skin is warm.     Coloration: Skin is not jaundiced or pale.     Findings: Rash present. No bruising, erythema or lesion. Rash is urticarial.  Neurological:     General: No focal deficit present.     Mental Status: He is alert and oriented to person, place, and time. Mental status is at baseline.           Assessment & Plan:  Urticaria Patient appears to have a urticaria.  Inciting event is unknown.  Begin prednisone taper pack and start Claritin 10 mg daily.  If hives resolve or do not return no further testing is needed.  If hives persist, I will check the patient for alpha gal and also refer for allergy testing

## 2022-06-21 ENCOUNTER — Encounter: Payer: Self-pay | Admitting: Family Medicine

## 2022-06-21 ENCOUNTER — Ambulatory Visit (INDEPENDENT_AMBULATORY_CARE_PROVIDER_SITE_OTHER): Payer: Medicare Other | Admitting: Family Medicine

## 2022-06-21 VITALS — BP 120/60 | HR 58 | Temp 97.7°F | Ht 68.0 in | Wt 174.0 lb

## 2022-06-21 DIAGNOSIS — L509 Urticaria, unspecified: Secondary | ICD-10-CM

## 2022-06-21 NOTE — Assessment & Plan Note (Signed)
Patient was seen twice in the last 3 weeks by my partner for similar rash. Given his symptoms and his wife having had one similar bump, I suspect this may be bug bites. However, being unsure of the nature of this rash, I will obtain alpha gal testing and refer for allergy testing. Instructed to seek medical care for swelling of face, lips, or throat, or worsening rash.

## 2022-06-21 NOTE — Progress Notes (Signed)
   Acute Office Visit  Subjective:     Patient ID: Kyle Wilcox, male    DOB: 1951/02/26, 72 y.o.   MRN: [id number]  Chief Complaint  Patient presents with   Acute Visit    hives    HPI Patient is in today for concerns of recurring rash to his left neck, back, and right arm. He has 7 bumps currently: 1 on his right upper arm, 2 left neck, 1 left shoulder, and 3 on his back and are all approx 1.5cm. They do itch and are improved with Triamcinolone cream and cold compress. He has also had them in the past on his abdomen around his navel. These have been recurring for 2 weeks. Denies drainage. Has checked his bed for bed bugs. Does not think they are flea bites. No changes in detergent, soap, food, or medications. His wife did have one similar bump. He has tried Triamcinolone cream with some improvement and Benadryl with no improvement.  Review of Systems  All other systems reviewed and are negative.       Objective:    BP 120/60   Pulse (!) 58   Temp 97.7 F (36.5 C) (Oral)   Ht '5\' 8"'$  (1.727 m)   Wt 174 lb (78.9 kg)   SpO2 99%   BMI 26.46 kg/m    Physical Exam Vitals and nursing note reviewed.  Constitutional:      Appearance: Normal appearance. He is normal weight.  HENT:     Head: Normocephalic and atraumatic.  Pulmonary:     Effort: Pulmonary effort is normal.  Skin:    General: Skin is warm and dry.     Capillary Refill: Capillary refill takes less than 2 seconds.     Findings: Rash present.       Neurological:     General: No focal deficit present.     Mental Status: He is alert and oriented to person, place, and time. Mental status is at baseline.  Psychiatric:        Mood and Affect: Mood normal.        Behavior: Behavior normal.        Thought Content: Thought content normal.        Judgment: Judgment normal.     No results found for any visits on 06/21/22.      Assessment & Plan:   Problem List Items Addressed This Visit        Musculoskeletal and Integument   Urticaria - Primary    Patient was seen twice in the last 3 weeks by my partner for similar rash. Given his symptoms and his wife having had one similar bump, I suspect this may be bug bites. However, being unsure of the nature of this rash, I will obtain alpha gal testing and refer for allergy testing. Instructed to seek medical care for swelling of face, lips, or throat, or worsening rash.      Relevant Orders   Alpha-Gal Panel   Ambulatory referral to Allergy    No orders of the defined types were placed in this encounter.   Return if symptoms worsen or fail to improve.  Rubie Maid, FNP

## 2022-06-22 ENCOUNTER — Encounter: Payer: Self-pay | Admitting: Family Medicine

## 2022-06-25 LAB — ALPHA-GAL PANEL
Allergen, Mutton, f88: 0.26 kU/L — ABNORMAL HIGH
Allergen, Pork, f26: 0.39 kU/L — ABNORMAL HIGH
Beef: 0.47 kU/L — ABNORMAL HIGH
CLASS: 1
CLASS: 1
GALACTOSE-ALPHA-1,3-GALACTOSE IGE*: 2.13 kU/L — ABNORMAL HIGH (ref <0.10)

## 2022-06-25 LAB — INTERPRETATION:

## 2022-06-29 ENCOUNTER — Encounter: Payer: Self-pay | Admitting: Family Medicine

## 2022-07-01 ENCOUNTER — Encounter: Payer: Self-pay | Admitting: Family Medicine

## 2022-07-01 DIAGNOSIS — L578 Other skin changes due to chronic exposure to nonionizing radiation: Secondary | ICD-10-CM | POA: Diagnosis not present

## 2022-07-01 DIAGNOSIS — L570 Actinic keratosis: Secondary | ICD-10-CM | POA: Diagnosis not present

## 2022-07-05 ENCOUNTER — Other Ambulatory Visit: Payer: Medicare Other

## 2022-07-05 DIAGNOSIS — D352 Benign neoplasm of pituitary gland: Secondary | ICD-10-CM

## 2022-07-05 DIAGNOSIS — D497 Neoplasm of unspecified behavior of endocrine glands and other parts of nervous system: Secondary | ICD-10-CM

## 2022-07-06 LAB — PTH, INTACT (ICMA) AND IONIZED CALCIUM
Calcium, Ion: 5.3 mg/dL (ref 4.7–5.5)
Calcium: 9.9 mg/dL (ref 8.6–10.3)
PTH: 46 pg/mL (ref 16–77)

## 2022-07-12 ENCOUNTER — Other Ambulatory Visit: Payer: Self-pay | Admitting: Family Medicine

## 2022-07-12 ENCOUNTER — Encounter: Payer: Self-pay | Admitting: Family Medicine

## 2022-07-12 MED ORDER — NIRMATRELVIR/RITONAVIR (PAXLOVID)TABLET: 3.0000 | ORAL_TABLET | Freq: Two times a day (BID) | ORAL | 0 refills | Status: AC

## 2022-07-30 DIAGNOSIS — L570 Actinic keratosis: Secondary | ICD-10-CM | POA: Diagnosis not present

## 2022-07-30 DIAGNOSIS — D485 Neoplasm of uncertain behavior of skin: Secondary | ICD-10-CM | POA: Diagnosis not present

## 2022-07-30 DIAGNOSIS — D225 Melanocytic nevi of trunk: Secondary | ICD-10-CM | POA: Diagnosis not present

## 2022-07-30 DIAGNOSIS — L821 Other seborrheic keratosis: Secondary | ICD-10-CM | POA: Diagnosis not present

## 2022-07-30 DIAGNOSIS — L578 Other skin changes due to chronic exposure to nonionizing radiation: Secondary | ICD-10-CM | POA: Diagnosis not present

## 2022-07-30 DIAGNOSIS — L814 Other melanin hyperpigmentation: Secondary | ICD-10-CM | POA: Diagnosis not present

## 2022-08-05 ENCOUNTER — Encounter: Payer: Self-pay | Admitting: Family Medicine

## 2022-08-09 ENCOUNTER — Other Ambulatory Visit: Payer: Medicare Other

## 2022-08-09 DIAGNOSIS — E349 Endocrine disorder, unspecified: Secondary | ICD-10-CM | POA: Diagnosis not present

## 2022-08-10 LAB — COMPREHENSIVE METABOLIC PANEL WITH GFR
AG Ratio: 1.7 (calc) (ref 1.0–2.5)
ALT: 18 U/L (ref 9–46)
AST: 17 U/L (ref 10–35)
Albumin: 4.3 g/dL (ref 3.6–5.1)
Alkaline phosphatase (APISO): 86 U/L (ref 35–144)
BUN: 15 mg/dL (ref 7–25)
CO2: 31 mmol/L (ref 20–32)
Calcium: 10 mg/dL (ref 8.6–10.3)
Chloride: 105 mmol/L (ref 98–110)
Creat: 0.96 mg/dL (ref 0.70–1.28)
Globulin: 2.5 g/dL (ref 1.9–3.7)
Glucose, Bld: 92 mg/dL (ref 65–99)
Potassium: 4.7 mmol/L (ref 3.5–5.3)
Sodium: 143 mmol/L (ref 135–146)
Total Bilirubin: 0.3 mg/dL (ref 0.2–1.2)
Total Protein: 6.8 g/dL (ref 6.1–8.1)

## 2022-09-19 ENCOUNTER — Encounter: Payer: Self-pay | Admitting: Family Medicine

## 2022-09-21 ENCOUNTER — Ambulatory Visit (INDEPENDENT_AMBULATORY_CARE_PROVIDER_SITE_OTHER): Payer: Medicare Other | Admitting: Family Medicine

## 2022-09-21 ENCOUNTER — Encounter: Payer: Self-pay | Admitting: Family Medicine

## 2022-09-21 VITALS — BP 124/80 | HR 46 | Temp 97.6°F | Ht 68.0 in | Wt 175.0 lb

## 2022-09-21 DIAGNOSIS — M79642 Pain in left hand: Secondary | ICD-10-CM | POA: Diagnosis not present

## 2022-09-21 MED ORDER — MELOXICAM 15 MG PO TABS: 15.0000 mg | ORAL_TABLET | Freq: Every day | ORAL | 2 refills | Status: DC

## 2022-09-21 NOTE — Progress Notes (Signed)
Subjective:    Patient ID: Kyle Wilcox, male    DOB: 1950-08-31, 72 y.o.   MRN: [id number]   Patient reports pain in his left hand.  He has pain in his first MCP joint, he also has pain in his second MCP joint.  It hurts to try to flex his MCP joint.  He has decreased range of motion in the first MCP joint.  He is unable to put it fifth digit.  He has a negative Tinel sign.  He has a negative Phalen sign.  He has Heberden nodules on his second and third DIP joints suggesting osteoarthritis.  He has not been taking any medication creams for this.  He reports stiffness in his left hand  Past Medical History:  Diagnosis Date   Allergy 2014   Arthritis    Phreesia 02/16/2020   Cancer ([facility]) 2015   prostate cancer   Cataract 2016   COVID-19 05/2019   COVID-19 05/2019   DDD (degenerative disc disease), lumbar 12/24/2014   GERD (gastroesophageal reflux disease)    Glaucoma    Hx of colonic polyps 02/19/2011   Hyperlipidemia 11/07/2014   Peripheral neuropathy 12/24/2014   Past Surgical History:  Procedure Laterality Date   COLONOSCOPY     CRANIOTOMY N/A 10/20/2021   Procedure: Endoscopic transphenoidal resection of pituitary tumor;  Surgeon: Coletta Memos, MD;  Location: [facility];  Service: Neurosurgery;  Laterality: N/A;   EYE SURGERY     laser surgery to relieve pressure on eyes- both eyes   JOINT REPLACEMENT  2011   total shoulder replacement (Right)   LAPAROTOMY N/A 01/06/2018   Procedure: EXPLORATORY LAPAROTOMY;  Surgeon: Jimmye Norman, MD;  Location: [facility];  Service: General;  Laterality: N/A;   LYSIS OF ADHESION N/A 01/06/2018   Procedure: LYSIS OF ADHESION;  Surgeon: Jimmye Norman, MD;  Location: [facility] OR;  Service: General;  Laterality: N/A;   PROSTATE SURGERY     2015   SMALL INTESTINE SURGERY N/A    Phreesia 02/16/2020   TONSILLECTOMY     TOTAL SHOULDER ARTHROPLASTY Left 03/04/2020   Procedure: TOTAL SHOULDER ARTHROPLASTY;  Surgeon: Teryl Lucy, MD;  Location: [facility];  Service:  Orthopedics;  Laterality: Left;   TRANSPHENOIDAL APPROACH EXPOSURE N/A 10/20/2021   Procedure: TRANSPHENOIDAL APPROACH EXPOSURE;  Surgeon: Osborn Coho, MD;  Location: [facility] OR;  Service: ENT;  Laterality: N/A;   VASECTOMY  1982   Current Outpatient Medications on File Prior to Visit  Medication Sig Dispense Refill   aspirin 81 MG EC tablet Take 81 mg by mouth daily. Swallow whole.     diphenhydrAMINE (BENADRYL) 25 MG tablet Take 25 mg by mouth daily as needed for allergies.     dorzolamide-timolol (COSOPT) 22.3-6.8 MG/ML ophthalmic solution Place 1 drop into both eyes 2 (two) times daily.      FIBER PO Take 1 capsule by mouth 2 (two) times daily.     fluticasone (FLONASE) 50 MCG/ACT nasal spray Place 2 sprays into both nostrils daily. 9.9 mL 2   gabapentin (NEURONTIN) 600 MG tablet Take 1 tablet (600 mg total) by mouth in the morning and at bedtime. 180 tablet 6   loratadine (CLARITIN) 10 MG tablet Take 1 tablet (10 mg total) by mouth daily. 30 tablet 11   metroNIDAZOLE (METROGEL) 0.75 % gel Apply 1 application. topically 2 (two) times daily. (Patient taking differently: Apply 1 application  topically See admin instructions. Apply to affected areas of the face 2 times a day) 45  g 11   Omega-3 Fatty Acids (OMEGA-3 FISH OIL) 300 MG CAPS Take 300 mg by mouth daily.     ondansetron (ZOFRAN) 4 MG tablet Take 1 tablet (4 mg total) by mouth every 8 (eight) hours as needed for nausea or vomiting. 20 tablet 0   ONE DAILY MULTIPLE VITAMIN PO Take 1 tablet by mouth daily.      predniSONE (DELTASONE) 20 MG tablet 3 tabs poqday 1-2, 2 tabs poqday 3-4, 1 tab poqday 5-6 (Patient not taking: Reported on 06/21/2022) 12 tablet 0   rosuvastatin (CRESTOR) 20 MG tablet Take 1 tablet (20 mg total) by mouth daily. 90 tablet 3   traZODone (DESYREL) 50 MG tablet TAKE 1/2 TO 1 (ONE-HALF TO ONE) TABLET BY MOUTH AT BEDTIME AS NEEDED FOR SLEEP 90 tablet 3   triamcinolone cream (KENALOG) 0.1 % Apply 1 Application topically 2  (two) times daily. 30 g 0   vitamin C (ASCORBIC ACID) 500 MG tablet Take 500 mg by mouth daily.     No current facility-administered medications on file prior to visit.   No Known Allergies Social History   Socioeconomic History   Marital status: Married    Spouse name: Not on file   Number of children: Not on file   Years of education: Not on file   Highest education level: Some college, no degree  Occupational History   Not on file  Tobacco Use   Smoking status: Former    Types: Cigarettes    Quit date: 03/09/1981    Years since quitting: 41.5   Smokeless tobacco: Never  Vaping Use   Vaping Use: Never used  Substance and Sexual Activity   Alcohol use: Yes    Alcohol/week: 2.0 standard drinks of alcohol    Types: 2 Cans of beer per week    Comment: occasionally   Drug use: No   Sexual activity: Not Currently  Other Topics Concern   Not on file  Social History Narrative   Not on file   Social Determinants of Health   Financial Resource Strain: Low Risk  (09/20/2022)   Overall Financial Resource Strain (CARDIA)    Difficulty of Paying Living Expenses: Not hard at all  Food Insecurity: No Food Insecurity (09/20/2022)   Hunger Vital Sign    Worried About Running Out of Food in the Last Year: Never true    Ran Out of Food in the Last Year: Never true  Transportation Needs: No Transportation Needs (09/20/2022)   PRAPARE - Administrator, Civil Service (Medical): No    Lack of Transportation (Non-Medical): No  Physical Activity: Sufficiently Active (09/20/2022)   Exercise Vital Sign    Days of Exercise per Week: 3 days    Minutes of Exercise per Session: 60 min  Stress: No Stress Concern Present (09/20/2022)   [employer] of Occupational Health - Occupational Stress Questionnaire    Feeling of Stress : Only a little  Social Connections: Moderately Integrated (09/20/2022)   Social Connection and Isolation Panel [NHANES]    Frequency of Communication with  Friends and Family: Once a week    Frequency of Social Gatherings with Friends and Family: Once a week    Attends Religious Services: More than 4 times per year    Active Member of [employer] or Organizations: Yes    Attends Engineer, structural: More than 4 times per year    Marital Status: Married  Catering manager Violence: Not on file   Family  History  Problem Relation Age of Onset   Arthritis Mother    Heart disease Mother    Hyperlipidemia Mother    Hypertension Mother    Stroke Mother    Vision loss Mother    Arthritis Father    Depression Father    Heart disease Father    Hyperlipidemia Father    Stroke Father    Arthritis Sister    Cancer Sister    Diabetes Sister    Hyperlipidemia Sister    Arthritis Brother    Diabetes Brother    Heart disease Brother    Hyperlipidemia Brother    Hypertension Brother    Stomach cancer Maternal Grandfather    Colon cancer Neg Hx    Esophageal cancer Neg Hx    Rectal cancer Neg Hx      Review of Systems  Skin:  Positive for rash.  All other systems reviewed and are negative.      Objective:   Physical Exam Vitals reviewed.  Constitutional:      General: He is not in acute distress.    Appearance: Normal appearance. He is not ill-appearing, toxic-appearing or diaphoretic.  HENT:     Head: Normocephalic and atraumatic.  Cardiovascular:     Rate and Rhythm: Normal rate and regular rhythm.     Heart sounds: Normal heart sounds. No murmur heard.    No friction rub. No gallop.  Pulmonary:     Effort: Pulmonary effort is normal. No respiratory distress.     Breath sounds: Normal breath sounds. No stridor. No wheezing, rhonchi or rales.  Chest:     Chest wall: No tenderness.  Musculoskeletal:     Right hand: Normal range of motion.     Left hand: Deformity, tenderness and bony tenderness present. No swelling. Decreased range of motion. Normal strength. Normal sensation. There is no disruption of two-point  discrimination. Normal capillary refill.  Neurological:     Mental Status: He is alert and oriented to person, place, and time.           Assessment & Plan:  Left hand pain - Plan: DG Hand Complete Left I suspect osteoarthritis in the MCP joint of the first and second digit of his left hand.  Begin meloxicam 15 mg daily.  He could also use Voltaren gel in the future to try to act as a preventative to avoid GI toxicity from NSAIDs.  Obtain x-ray of the left hand to evaluate further

## 2022-09-30 ENCOUNTER — Other Ambulatory Visit: Payer: Self-pay | Admitting: Neurosurgery

## 2022-09-30 DIAGNOSIS — D497 Neoplasm of unspecified behavior of endocrine glands and other parts of nervous system: Secondary | ICD-10-CM

## 2022-10-04 ENCOUNTER — Encounter: Payer: Self-pay | Admitting: Family Medicine

## 2022-10-06 ENCOUNTER — Ambulatory Visit
Admission: RE | Admit: 2022-10-06 | Discharge: 2022-10-06 | Disposition: A | Discharge status: Home / Self Care | Payer: Medicare Other | Source: Ambulatory Visit | Attending: Family Medicine | Admitting: Family Medicine

## 2022-10-06 DIAGNOSIS — M79642 Pain in left hand: Secondary | ICD-10-CM

## 2022-10-06 DIAGNOSIS — M25532 Pain in left wrist: Secondary | ICD-10-CM | POA: Diagnosis not present

## 2022-10-06 DIAGNOSIS — M1990 Unspecified osteoarthritis, unspecified site: Secondary | ICD-10-CM | POA: Diagnosis not present

## 2022-10-06 DIAGNOSIS — M25542 Pain in joints of left hand: Secondary | ICD-10-CM | POA: Diagnosis not present

## 2022-10-25 ENCOUNTER — Encounter: Payer: Self-pay | Admitting: Family Medicine

## 2022-10-27 ENCOUNTER — Other Ambulatory Visit: Payer: Self-pay

## 2022-10-27 DIAGNOSIS — M79642 Pain in left hand: Secondary | ICD-10-CM

## 2022-11-01 ENCOUNTER — Ambulatory Visit
Admission: RE | Admit: 2022-11-01 | Discharge: 2022-11-01 | Disposition: A | Discharge status: Home / Self Care | Payer: Medicare Other | Source: Ambulatory Visit | Attending: Neurosurgery | Admitting: Neurosurgery

## 2022-11-01 DIAGNOSIS — D352 Benign neoplasm of pituitary gland: Secondary | ICD-10-CM | POA: Diagnosis not present

## 2022-11-01 DIAGNOSIS — D497 Neoplasm of unspecified behavior of endocrine glands and other parts of nervous system: Secondary | ICD-10-CM

## 2022-11-01 MED ORDER — GADOPICLENOL 0.5 MMOL/ML IV SOLN
8.0000 mL | Freq: Once | INTRAVENOUS | Status: AC | PRN
  Administered 2022-11-01: 8 mL via INTRAVENOUS

## 2022-11-03 DIAGNOSIS — D2271 Melanocytic nevi of right lower limb, including hip: Secondary | ICD-10-CM | POA: Diagnosis not present

## 2022-11-03 DIAGNOSIS — L538 Other specified erythematous conditions: Secondary | ICD-10-CM | POA: Diagnosis not present

## 2022-11-03 DIAGNOSIS — L298 Other pruritus: Secondary | ICD-10-CM | POA: Diagnosis not present

## 2022-11-03 DIAGNOSIS — D485 Neoplasm of uncertain behavior of skin: Secondary | ICD-10-CM | POA: Diagnosis not present

## 2022-11-03 DIAGNOSIS — L821 Other seborrheic keratosis: Secondary | ICD-10-CM | POA: Diagnosis not present

## 2022-11-05 DIAGNOSIS — M1812 Unilateral primary osteoarthritis of first carpometacarpal joint, left hand: Secondary | ICD-10-CM | POA: Diagnosis not present

## 2022-11-12 ENCOUNTER — Other Ambulatory Visit: Payer: Medicare Other

## 2022-11-12 ENCOUNTER — Encounter: Payer: Self-pay | Admitting: Family Medicine

## 2022-11-22 DIAGNOSIS — D497 Neoplasm of unspecified behavior of endocrine glands and other parts of nervous system: Secondary | ICD-10-CM | POA: Diagnosis not present

## 2022-11-25 DIAGNOSIS — H401213 Low-tension glaucoma, right eye, severe stage: Secondary | ICD-10-CM | POA: Diagnosis not present

## 2022-12-03 DIAGNOSIS — M1812 Unilateral primary osteoarthritis of first carpometacarpal joint, left hand: Secondary | ICD-10-CM | POA: Diagnosis not present

## 2023-01-14 DIAGNOSIS — Z23 Encounter for immunization: Secondary | ICD-10-CM | POA: Diagnosis not present

## 2023-01-20 ENCOUNTER — Encounter: Payer: Self-pay | Admitting: Family Medicine

## 2023-04-08 ENCOUNTER — Telehealth: Payer: Self-pay

## 2023-04-08 DIAGNOSIS — F5104 Psychophysiologic insomnia: Secondary | ICD-10-CM

## 2023-04-08 DIAGNOSIS — G6289 Other specified polyneuropathies: Secondary | ICD-10-CM

## 2023-04-08 MED ORDER — GABAPENTIN 600 MG PO TABS
600.0000 mg | ORAL_TABLET | Freq: Two times a day (BID) | ORAL | 6 refills | Status: DC
Start: 2023-04-08 — End: 2023-04-13

## 2023-04-08 NOTE — Telephone Encounter (Signed)
Copied from CRM [id number]. Topic: Clinical - Request for Lab/Test Order >> Apr 08, 2023  9:52 AM Hendricks Limes wrote: Reason for CRM: PT would like to know if his appointment on Dec 12 for [facility] requires labs  Send pt MyChart msg re appt 04/28/23.

## 2023-04-08 NOTE — Addendum Note (Signed)
Addended by: Arta Silence on: 04/08/2023 01:58 PM   Modules accepted: Orders

## 2023-04-08 NOTE — Telephone Encounter (Signed)
Copied from CRM [id number]. Topic: Clinical - Medication Refill >> Apr 08, 2023 10:01 AM Hendricks Limes wrote: Most Recent Primary Care Visit:  Provider: Lynnea Ferrier T  Department: [facility]  Visit Type: OFFICE VISIT  Date: 09/21/2022  Medication: Gabapentin  Has the patient contacted their pharmacy? Yes (Agent: If no, request that the patient contact the pharmacy for the refill. If patient does not wish to contact the pharmacy document the reason why and proceed with request.) (Agent: If yes, when and what did the pharmacy advise?)  Is this the correct pharmacy for this prescription? Yes If no, delete pharmacy and type the correct one.  This is the patient's preferred pharmacy:  [facility] [address] [place]), [place] - [address] [place] ([place]) [place] [address] Phone: [phone] Fax: [phone]  [facility] - [place], [place] - [address] [facility] [address] [facility] to [facility] [address] [place] [address] Phone: [phone] Fax: [phone]   Has the prescription been filled recently? No  Is the patient out of the medication? Yes  Has the patient been seen for an appointment in the last year OR does the patient have an upcoming appointment? Yes  Can we respond through MyChart? Yes  Agent: Please be advised that Rx refills may take up to 3 business days. We ask that you follow-up with your pharmacy.

## 2023-04-12 ENCOUNTER — Other Ambulatory Visit: Payer: Self-pay | Admitting: Family Medicine

## 2023-04-12 DIAGNOSIS — G6289 Other specified polyneuropathies: Secondary | ICD-10-CM

## 2023-04-12 DIAGNOSIS — F5104 Psychophysiologic insomnia: Secondary | ICD-10-CM

## 2023-04-13 ENCOUNTER — Other Ambulatory Visit: Payer: Self-pay

## 2023-04-13 DIAGNOSIS — M545 Low back pain, unspecified: Secondary | ICD-10-CM | POA: Insufficient documentation

## 2023-04-13 DIAGNOSIS — L570 Actinic keratosis: Secondary | ICD-10-CM | POA: Insufficient documentation

## 2023-04-13 DIAGNOSIS — G6289 Other specified polyneuropathies: Secondary | ICD-10-CM

## 2023-04-13 DIAGNOSIS — L719 Rosacea, unspecified: Secondary | ICD-10-CM | POA: Insufficient documentation

## 2023-04-13 DIAGNOSIS — F5104 Psychophysiologic insomnia: Secondary | ICD-10-CM

## 2023-04-13 MED ORDER — GABAPENTIN 600 MG PO TABS
600.0000 mg | ORAL_TABLET | Freq: Two times a day (BID) | ORAL | 6 refills | Status: DC
Start: 2023-04-13 — End: 2023-07-25

## 2023-04-27 ENCOUNTER — Telehealth: Payer: Self-pay

## 2023-04-27 NOTE — Telephone Encounter (Signed)
Copied from CRM [id number]. Topic: Clinical - Request for Lab/Test Order >> Apr 27, 2023  2:25 PM Kyle Wilcox wrote: Reason for CRM: Patient requesting blood work for his upcoming physcial that is schedule for 09/01/2023

## 2023-04-28 ENCOUNTER — Ambulatory Visit (INDEPENDENT_AMBULATORY_CARE_PROVIDER_SITE_OTHER): Payer: Medicare Other | Admitting: *Deleted

## 2023-04-28 ENCOUNTER — Other Ambulatory Visit: Payer: Self-pay | Admitting: Neurosurgery

## 2023-04-28 ENCOUNTER — Ambulatory Visit: Payer: Medicare Other | Admitting: Family Medicine

## 2023-04-28 DIAGNOSIS — Z0000 Encounter for general adult medical examination without abnormal findings: Secondary | ICD-10-CM

## 2023-04-28 DIAGNOSIS — D497 Neoplasm of unspecified behavior of endocrine glands and other parts of nervous system: Secondary | ICD-10-CM

## 2023-04-28 NOTE — Patient Instructions (Signed)
[name] , Thank you for taking time to come for your Medicare Wellness Visit. I appreciate your ongoing commitment to your health goals. Please review the following plan we discussed and let me know if I can assist you in the future.   Screening recommendations/referrals: Colonoscopy: up to date Recommended yearly ophthalmology/optometry visit for glaucoma screening and checkup Recommended yearly dental visit for hygiene and checkup  Vaccinations: Influenza vaccine: up to date Pneumococcal vaccine: up to date Tdap vaccine: up to date      Preventive Care 65 Years and Older, Male Preventive care refers to lifestyle choices and visits with your health care provider that can promote health and wellness. What does preventive care include? A yearly physical exam. This is also called an annual well check. Dental exams once or twice a year. Routine eye exams. Ask your health care provider how often you should have your eyes checked. Personal lifestyle choices, including: Daily care of your teeth and gums. Regular physical activity. Eating a healthy diet. Avoiding tobacco and drug use. Limiting alcohol use. Practicing safe sex. Taking low doses of aspirin every day. Taking vitamin and mineral supplements as recommended by your health care provider. What happens during an annual well check? The services and screenings done by your health care provider during your annual well check will depend on your age, overall health, lifestyle risk factors, and family history of disease. Counseling  Your health care provider may ask you questions about your: Alcohol use. Tobacco use. Drug use. Emotional well-being. Home and relationship well-being. Sexual activity. Eating habits. History of falls. Memory and ability to understand (cognition). Work and work Astronomer. Screening  You may have the following tests or measurements: Height, weight, and BMI. Blood pressure. Lipid and  cholesterol levels. These may be checked every 5 years, or more frequently if you are over 42 years old. Skin check. Lung cancer screening. You may have this screening every year starting at age 14 if you have a 30-pack-year history of smoking and currently smoke or have quit within the past 15 years. Fecal occult blood test (FOBT) of the stool. You may have this test every year starting at age 80. Flexible sigmoidoscopy or colonoscopy. You may have a sigmoidoscopy every 5 years or a colonoscopy every 10 years starting at age 45. Prostate cancer screening. Recommendations will vary depending on your family history and other risks. Hepatitis C blood test. Hepatitis B blood test. Sexually transmitted disease (STD) testing. Diabetes screening. This is done by checking your blood sugar (glucose) after you have not eaten for a while (fasting). You may have this done every 1-3 years. Abdominal aortic aneurysm (AAA) screening. You may need this if you are a current or former smoker. Osteoporosis. You may be screened starting at age 51 if you are at high risk. Talk with your health care provider about your test results, treatment options, and if necessary, the need for more tests. Vaccines  Your health care provider may recommend certain vaccines, such as: Influenza vaccine. This is recommended every year. Tetanus, diphtheria, and acellular pertussis (Tdap, Td) vaccine. You may need a Td booster every 10 years. Zoster vaccine. You may need this after age 67. Pneumococcal 13-valent conjugate (PCV13) vaccine. One dose is recommended after age 42. Pneumococcal polysaccharide (PPSV23) vaccine. One dose is recommended after age 72. Talk to your health care provider about which screenings and vaccines you need and how often you need them. This information is not intended to replace advice given to  you by your health care provider. Make sure you discuss any questions you have with your health care  provider. Document Released: 05/30/2015 Document Revised: 01/21/2016 Document Reviewed: 03/04/2015 Elsevier Interactive Patient Education  2017 [employer].  Fall Prevention in the Home Falls can cause injuries. They can happen to people of all ages. There are many things you can do to make your home safe and to help prevent falls. What can I do on the outside of my home? Regularly fix the edges of walkways and driveways and fix any cracks. Remove anything that might make you trip as you walk through a door, such as a raised step or threshold. Trim any bushes or trees on the path to your home. Use bright outdoor lighting. Clear any walking paths of anything that might make someone trip, such as rocks or tools. Regularly check to see if handrails are loose or broken. Make sure that both sides of any steps have handrails. Any raised decks and porches should have guardrails on the edges. Have any leaves, snow, or ice cleared regularly. Use sand or salt on walking paths during winter. Clean up any spills in your garage right away. This includes oil or grease spills. What can I do in the bathroom? Use night lights. Install grab bars by the toilet and in the tub and shower. Do not use towel bars as grab bars. Use non-skid mats or decals in the tub or shower. If you need to sit down in the shower, use a plastic, non-slip stool. Keep the floor dry. Clean up any water that spills on the floor as soon as it happens. Remove soap buildup in the tub or shower regularly. Attach bath mats securely with double-sided non-slip rug tape. Do not have throw rugs and other things on the floor that can make you trip. What can I do in the bedroom? Use night lights. Make sure that you have a light by your bed that is easy to reach. Do not use any sheets or blankets that are too big for your bed. They should not hang down onto the floor. Have a firm chair that has side arms. You can use this for support while  you get dressed. Do not have throw rugs and other things on the floor that can make you trip. What can I do in the kitchen? Clean up any spills right away. Avoid walking on wet floors. Keep items that you use a lot in easy-to-reach places. If you need to reach something above you, use a strong step stool that has a grab bar. Keep electrical cords out of the way. Do not use floor polish or wax that makes floors slippery. If you must use wax, use non-skid floor wax. Do not have throw rugs and other things on the floor that can make you trip. What can I do with my stairs? Do not leave any items on the stairs. Make sure that there are handrails on both sides of the stairs and use them. Fix handrails that are broken or loose. Make sure that handrails are as long as the stairways. Check any carpeting to make sure that it is firmly attached to the stairs. Fix any carpet that is loose or worn. Avoid having throw rugs at the top or bottom of the stairs. If you do have throw rugs, attach them to the floor with carpet tape. Make sure that you have a light switch at the top of the stairs and the bottom of the stairs.  If you do not have them, ask someone to add them for you. What else can I do to help prevent falls? Wear shoes that: Do not have high heels. Have rubber bottoms. Are comfortable and fit you well. Are closed at the toe. Do not wear sandals. If you use a stepladder: Make sure that it is fully opened. Do not climb a closed stepladder. Make sure that both sides of the stepladder are locked into place. Ask someone to hold it for you, if possible. Clearly mark and make sure that you can see: Any grab bars or handrails. First and last steps. Where the edge of each step is. Use tools that help you move around (mobility aids) if they are needed. These include: Canes. Walkers. Scooters. Crutches. Turn on the lights when you go into a dark area. Replace any light bulbs as soon as they burn  out. Set up your furniture so you have a clear path. Avoid moving your furniture around. If any of your floors are uneven, fix them. If there are any pets around you, be aware of where they are. Review your medicines with your doctor. Some medicines can make you feel dizzy. This can increase your chance of falling. Ask your doctor what other things that you can do to help prevent falls. This information is not intended to replace advice given to you by your health care provider. Make sure you discuss any questions you have with your health care provider. Document Released: 02/27/2009 Document Revised: 10/09/2015 Document Reviewed: 06/07/2014 Elsevier Interactive Patient Education  2017 [employer].

## 2023-04-28 NOTE — Progress Notes (Signed)
Subjective:   Kyle Wilcox is a 72 y.o. male who presents for Medicare Annual/Subsequent preventive examination.  Visit Complete: Virtual I connected with  Kyle Wilcox on 04/28/23 by a audio enabled telemedicine application and verified that I am speaking with the correct person using two identifiers.  Patient Location: Home  Provider Location: Home Office  I discussed the limitations of evaluation and management by telemedicine. The patient expressed understanding and agreed to proceed.  Vital Signs: Because this visit was a virtual/telehealth visit, some criteria may be missing or patient reported. Any vitals not documented were not able to be obtained and vitals that have been documented are patient reported.          Objective:    There were no vitals filed for this visit. There is no height or weight on file to calculate BMI.     04/28/2023   12:11 PM 10/18/2021    5:19 PM 10/18/2021    5:00 PM 09/02/2021   10:04 AM 02/19/2021    8:15 AM 02/22/2020   10:56 AM 02/19/2020    9:15 AM  Advanced Directives  Does Patient Have a Medical Advance Directive? Yes  No No Yes Yes Yes  Type of Psychiatrist of [place];Living will Healthcare Power of [place];Living will Healthcare Power of [place];Living will;Out of facility DNR (pink MOST or yellow form)  Does patient want to make changes to medical advance directive?     No - Patient declined    Copy of Healthcare Power of Attorney in Chart? Yes - validated most recent copy scanned in chart (See row information)    No - copy requested Yes - validated most recent copy scanned in chart (See row information)   Would patient like information on creating a medical advance directive?  No - Patient declined;No - Guardian declined         Current Medications (verified) Outpatient Encounter Medications as of 04/28/2023  Medication Sig   aspirin 81 MG EC tablet Take 81 mg by mouth  daily. Swallow whole.   diphenhydrAMINE (BENADRYL) 25 MG tablet Take 25 mg by mouth daily as needed for allergies.   dorzolamide-timolol (COSOPT) 22.3-6.8 MG/ML ophthalmic solution Place 1 drop into both eyes 2 (two) times daily.    FIBER PO Take 1 capsule by mouth 2 (two) times daily.   fluticasone (FLONASE) 50 MCG/ACT nasal spray Place 2 sprays into both nostrils daily.   gabapentin (NEURONTIN) 600 MG tablet Take 1 tablet (600 mg total) by mouth in the morning and at bedtime.   loratadine (CLARITIN) 10 MG tablet Take 1 tablet (10 mg total) by mouth daily.   meloxicam (MOBIC) 15 MG tablet Take 1 tablet (15 mg total) by mouth daily.   metroNIDAZOLE (METROGEL) 0.75 % gel Apply 1 application. topically 2 (two) times daily. (Patient taking differently: Apply 1 application  topically See admin instructions. Apply to affected areas of the face 2 times a day)   Omega-3 Fatty Acids (OMEGA-3 FISH OIL) 300 MG CAPS Take 300 mg by mouth daily.   ondansetron (ZOFRAN) 4 MG tablet Take 1 tablet (4 mg total) by mouth every 8 (eight) hours as needed for nausea or vomiting. (Patient not taking: Reported on 04/28/2023)   ONE DAILY MULTIPLE VITAMIN PO Take 1 tablet by mouth daily.    rosuvastatin (CRESTOR) 20 MG tablet Take 1 tablet (20 mg total) by mouth daily.   traZODone (DESYREL) 50 MG tablet  TAKE 1/2 TO 1 (ONE-HALF TO ONE) TABLET BY MOUTH AT BEDTIME AS NEEDED FOR SLEEP   triamcinolone cream (KENALOG) 0.1 % Apply 1 Application topically 2 (two) times daily.   vitamin C (ASCORBIC ACID) 500 MG tablet Take 500 mg by mouth daily.   No facility-administered encounter medications on file as of 04/28/2023.    Allergies (verified) Patient has no known allergies.   History: Past Medical History:  Diagnosis Date   Allergy 2014   Arthritis    Phreesia 02/16/2020   Cancer (HCC) 2015   prostate cancer   Cataract 2016   COVID-19 05/2019   COVID-19 05/2019   DDD (degenerative disc disease), lumbar 12/24/2014    GERD (gastroesophageal reflux disease)    Glaucoma    Hx of colonic polyps 02/19/2011   Hyperlipidemia 11/07/2014   Peripheral neuropathy 12/24/2014   Past Surgical History:  Procedure Laterality Date   COLONOSCOPY     CRANIOTOMY N/A 10/20/2021   Procedure: Endoscopic transphenoidal resection of pituitary tumor;  Surgeon: Coletta Memos, MD;  Location[facility] OR;  Service: Neurosurgery;  Laterality: N/A;   EYE SURGERY     laser surgery to relieve pressure on eyes- both eyes   JOINT REPLACEMENT  2011   total shoulder replacement (Right)   LAPAROTOMY N/A 01/06/2018   Procedure: EXPLORATORY LAPAROTOMY;  Surgeon: Jimmye Norman, MD;  Location: [facility] OR;  Service: General;  Laterality: N/A;   LYSIS OF ADHESION N/A 01/06/2018   Procedure: LYSIS OF ADHESION;  Surgeon: Jimmye Norman, MD;  Location: [facility] OR;  Service: General;  Laterality: N/A;   PROSTATE SURGERY     2015   SMALL INTESTINE SURGERY N/A    Phreesia 02/16/2020   TONSILLECTOMY     TOTAL SHOULDER ARTHROPLASTY Left 03/04/2020   Procedure: TOTAL SHOULDER ARTHROPLASTY;  Surgeon: Teryl Lucy, MD;  Location: [facility] ORS;  Service: Orthopedics;  Laterality: Left;   TRANSPHENOIDAL APPROACH EXPOSURE N/A 10/20/2021   Procedure: TRANSPHENOIDAL APPROACH EXPOSURE;  Surgeon: Osborn Coho, MD;  Location: [facility] OR;  Service: ENT;  Laterality: N/A;   VASECTOMY  1982   Family History  Problem Relation Age of Onset   Arthritis Mother    Heart disease Mother    Hyperlipidemia Mother    Hypertension Mother    Stroke Mother    Vision loss Mother    Arthritis Father    Depression Father    Heart disease Father    Hyperlipidemia Father    Stroke Father    Arthritis Sister    Cancer Sister    Diabetes Sister    Hyperlipidemia Sister    Arthritis Brother    Diabetes Brother    Heart disease Brother    Hyperlipidemia Brother    Hypertension Brother    Stomach cancer Maternal Grandfather    Colon cancer Neg Hx    Esophageal cancer Neg Hx    Rectal cancer Neg  Hx    Social History   Socioeconomic History   Marital status: Married    Spouse name: Not on file   Number of children: Not on file   Years of education: Not on file   Highest education level: Some college, no degree  Occupational History   Not on file  Tobacco Use   Smoking status: Former    Current packs/day: 0.00    Types: Cigarettes    Quit date: 03/09/1981    Years since quitting: 42.1   Smokeless tobacco: Never  Vaping Use   Vaping status: Never Used  Substance and Sexual Activity   Alcohol use: Yes    Alcohol/week: 2.0 standard drinks of alcohol    Types: 2 Cans of beer per week    Comment: occasionally   Drug use: No   Sexual activity: Not Currently  Other Topics Concern   Not on file  Social History Narrative   Not on file   Social Drivers of Health   Financial Resource Strain: Low Risk  (04/22/2023)   Overall Financial Resource Strain (CARDIA)    Difficulty of Paying Living Expenses: Not hard at all  Food Insecurity: No Food Insecurity (04/22/2023)   Hunger Vital Sign    Worried About Running Out of Food in the Last Year: Never true    Ran Out of Food in the Last Year: Never true  Transportation Needs: No Transportation Needs (04/22/2023)   PRAPARE - Administrator, Civil Service (Medical): No    Lack of Transportation (Non-Medical): No  Physical Activity: Insufficiently Active (04/22/2023)   Exercise Vital Sign    Days of Exercise per Week: 4 days    Minutes of Exercise per Session: 30 min  Stress: No Stress Concern Present (04/22/2023)   [employer] of Occupational Health - Occupational Stress Questionnaire    Feeling of Stress : Not at all  Social Connections: Moderately Integrated (04/22/2023)   Social Connection and Isolation Panel [NHANES]    Frequency of Communication with Friends and Family: Three times a week    Frequency of Social Gatherings with Friends and Family: Twice a week    Attends Religious Services: Never    Automotive engineer or Organizations: Yes    Attends Engineer, structural: More than 4 times per year    Marital Status: Married    Tobacco Counseling Counseling given: Not Answered   Clinical Intake:  Pre-visit preparation completed: Yes  Pain : No/denies pain     Nutritional Risks: None Diabetes: No  How often do you need to have someone help you when you read instructions, pamphlets, or other written materials from your doctor or pharmacy?: 1 - Never  Interpreter Needed?: No  Information entered by :: Remi Haggard LPN   Activities of Daily Living     No data to display          Patient Care Team: Donita Brooks, MD as PCP - General (Family Medicine)  Indicate any recent Medical Services you may have received from other than [facility] providers in the past year (date may be approximate).     Assessment:   This is a routine wellness examination for [place].  Hearing/Vision screen Hearing Screening - Comments:: No trouble hearing Vision Screening - Comments:: Up to date [facility] Clelia Croft   Goals Addressed             This Visit's Progress    Patient Stated       Maintain current health        Depression Screen    09/21/2022    2:51 PM 06/10/2022    9:29 AM 06/04/2022    9:04 AM 03/04/2022    2:58 PM 02/19/2021    8:13 AM 02/19/2020    9:16 AM 02/08/2019    8:27 AM  PHQ 2/9 Scores  PHQ - 2 Score 0 0 0 0 0 0 0    Fall Risk    04/28/2023   12:10 PM 09/21/2022    2:51 PM 06/10/2022    9:29 AM  06/04/2022    9:04 AM 03/04/2022    2:57 PM  Fall Risk   Falls in the past year? 0 0 0 1 1  Number falls in past yr: 0 0 0 0 0  Injury with Fall? 0 0 0 0 0  Risk for fall due to :  No Fall Risks No Fall Risks History of fall(s);Impaired balance/gait History of fall(s);Impaired balance/gait  Follow up Falls evaluation completed;Education provided;Falls prevention discussed Falls prevention discussed Falls prevention discussed Falls  prevention discussed Falls prevention discussed;Education provided    MEDICARE RISK AT HOME:    TIMED UP AND GO:  Was the test performed?  No    Cognitive Function:        02/19/2020    9:16 AM  6CIT Screen  What Year? 0 points  What month? 0 points  What time? 0 points  Count back from 20 0 points  Months in reverse 0 points  Repeat phrase 0 points  Total Score 0 points    Immunizations Immunization History  Administered Date(s) Administered   Fluad Quad(high Dose 65+) 02/17/2022   Influenza, High Dose Seasonal PF 02/23/2016, 01/05/2017, 01/12/2019   Influenza,inj,Quad PF,6+ Mos 03/10/2015, 02/08/2018   Influenza-Unspecified 01/16/2021, 02/17/2022, 01/10/2023   PFIZER Comirnaty(Gray Top)Covid-19 Tri-Sucrose Vaccine 08/16/2020   PFIZER(Purple Top)SARS-COV-2 Vaccination 09/12/2019, 10/02/2019, 04/04/2020, 08/16/2020, 02/11/2021   Pfizer Covid-19 Vaccine Bivalent Booster 74yrs & up 08/16/2020, 02/11/2021, 11/30/2021   Pneumococcal Conjugate-13 11/07/2014, 01/05/2017   Pneumococcal Polysaccharide-23 03/17/2016   Tdap 03/11/2015   Unspecified SARS-COV-2 Vaccination 01/10/2023   Zoster Recombinant(Shingrix) 05/29/2018, 09/26/2018    TDAP status: Up to date  Flu Vaccine status: Up to date  Pneumococcal vaccine status: Up to date  Covid-19 vaccine status: Information provided on how to obtain vaccines.   Qualifies for Shingles Vaccine? No   Zostavax completed Yes   Shingrix Completed?: Yes  Screening Tests Health Maintenance  Topic Date Due   COVID-19 Vaccine (10 - 2024-25 season) 03/07/2023   Medicare Annual Wellness (AWV)  04/27/2024   DTaP/Tdap/Td (2 - Td or Tdap) 03/10/2025   Colonoscopy  06/01/2026   Pneumonia Vaccine 82+ Years old  Completed   INFLUENZA VACCINE  Completed   Hepatitis C Screening  Completed   Zoster Vaccines- Shingrix  Completed   HPV VACCINES  Aged Out    Health Maintenance  Health Maintenance Due  Topic Date Due   COVID-19  Vaccine (10 - 2024-25 season) 03/07/2023    Colorectal cancer screening: Type of screening: Colonoscopy. Completed 2018. Repeat every 10 years  Lung Cancer Screening: (Low Dose CT Chest recommended if Age 73-80 years, 20 pack-year currently smoking OR have quit w/in 15years.) does not qualify.   Lung Cancer Screening Referral:   Additional Screening:  Hepatitis C Screening: does not qualify; Completed 2019  Vision Screening: Recommended annual ophthalmology exams for early detection of glaucoma and other disorders of the eye. Is the patient up to date with their annual eye exam?  Yes  Who is the provider or what is the name of the office in which the patient attends annual eye exams? [facility] If pt is not established with a provider, would they like to be referred to a provider to establish care? No .   Dental Screening: Recommended annual dental exams for proper oral hygiene    Community Resource Referral / Chronic Care Management: CRR required this visit?  No   CCM required this visit?  No     Plan:     I  have personally reviewed and noted the following in the patient's chart:   Medical and social history Use of alcohol, tobacco or illicit drugs  Current medications and supplements including opioid prescriptions. Patient is not currently taking opioid prescriptions. Functional ability and status Nutritional status Physical activity Advanced directives List of other physicians Hospitalizations, surgeries, and ER visits in previous 12 months Vitals Screenings to include cognitive, depression, and falls Referrals and appointments  In addition, I have reviewed and discussed with patient certain preventive protocols, quality metrics, and best practice recommendations. A written personalized care plan for preventive services as well as general preventive health recommendations were provided to patient.     Remi Haggard, LPN   [id number]/[id number]   After Visit Summary:  (MyChart) Due to this being a telephonic visit, the after visit summary with patients personalized plan was offered to patient via MyChart   Nurse Notes:

## 2023-05-15 ENCOUNTER — Encounter: Payer: Self-pay | Admitting: Family Medicine

## 2023-05-16 ENCOUNTER — Other Ambulatory Visit: Payer: Self-pay | Admitting: Family Medicine

## 2023-05-16 MED ORDER — TRAZODONE HCL 50 MG PO TABS: ORAL_TABLET | ORAL | 3 refills | Status: DC

## 2023-05-23 ENCOUNTER — Encounter: Payer: Self-pay | Admitting: Family Medicine

## 2023-05-23 ENCOUNTER — Ambulatory Visit: Payer: Medicare Other | Admitting: Family Medicine

## 2023-05-23 VITALS — Ht 68.0 in | Wt 175.4 lb

## 2023-05-23 DIAGNOSIS — J40 Bronchitis, not specified as acute or chronic: Secondary | ICD-10-CM | POA: Diagnosis not present

## 2023-05-23 MED ORDER — AZITHROMYCIN 250 MG PO TABS: ORAL_TABLET | ORAL | 0 refills | Status: DC

## 2023-05-23 MED ORDER — HYDROCODONE BIT-HOMATROP MBR 5-1.5 MG/5ML PO SOLN: 5.0000 mL | Freq: Three times a day (TID) | ORAL | 0 refills | Status: DC | PRN

## 2023-05-23 MED ORDER — PREDNISONE 20 MG PO TABS: ORAL_TABLET | ORAL | 0 refills | Status: DC

## 2023-05-23 NOTE — Progress Notes (Signed)
 Subjective:    Patient ID: Kyle Wilcox, male    DOB: [birth year], 73 y.o.   MRN: [id number]  Patient states he has been coughing for 2 to 3 weeks.  He states that started with a head cold with congestion and drainage.  It then settled in his chest and he is now having wheezing and a constant cough that he is unable to stop.  He denies any fever.  He denies any shortness of breath.  He denies any pleurisy or chest pain.  The cough is nonproductive.  He denies any sinus pain.  The head congestion has resolved.  On exam today he does have wheezing in the right side with rhonchorous breath sounds on the right side.  There are no crackles. Past Medical History:  Diagnosis Date   Allergy 2014   Arthritis    Phreesia 02/16/2020   Cancer (HCC) 2015   prostate cancer   Cataract 2016   COVID-19 05/2019   COVID-19 05/2019   DDD (degenerative disc disease), lumbar 12/24/2014   GERD (gastroesophageal reflux disease)    Glaucoma    Hx of colonic polyps 02/19/2011   Hyperlipidemia 11/07/2014   Peripheral neuropathy 12/24/2014   Past Surgical History:  Procedure Laterality Date   COLONOSCOPY     CRANIOTOMY N/A 10/20/2021   Procedure: Endoscopic transphenoidal resection of pituitary tumor;  Surgeon: Gillie Duncans, MD;  Location: [facility] OR;  Service: Neurosurgery;  Laterality: N/A;   EYE SURGERY     laser surgery to relieve pressure on eyes- both eyes   JOINT REPLACEMENT  2011   total shoulder replacement (Right)   LAPAROTOMY N/A 01/06/2018   Procedure: EXPLORATORY LAPAROTOMY;  Surgeon: Kimble Agent, MD;  Location: [facility] OR;  Service: General;  Laterality: N/A;   LYSIS OF ADHESION N/A 01/06/2018   Procedure: LYSIS OF ADHESION;  Surgeon: Kimble Agent, MD;  Location: [facility] OR;  Service: General;  Laterality: N/A;   PROSTATE SURGERY     2015   SMALL INTESTINE SURGERY N/A    Phreesia 02/16/2020   TONSILLECTOMY     TOTAL SHOULDER ARTHROPLASTY Left 03/04/2020   Procedure: TOTAL SHOULDER ARTHROPLASTY;  Surgeon:  Josefina Chew, MD;  Location: [facility] ORS;  Service: Orthopedics;  Laterality: Left;   TRANSPHENOIDAL APPROACH EXPOSURE N/A 10/20/2021   Procedure: TRANSPHENOIDAL APPROACH EXPOSURE;  Surgeon: Mable Lenis, MD;  Location: [facility] OR;  Service: ENT;  Laterality: N/A;   VASECTOMY  1982   Current Outpatient Medications on File Prior to Visit  Medication Sig Dispense Refill   aspirin 81 MG EC tablet Take 81 mg by mouth daily. Swallow whole.     diphenhydrAMINE  (BENADRYL ) 25 MG tablet Take 25 mg by mouth daily as needed for allergies.     dorzolamide -timolol  (COSOPT ) 22.3-6.8 MG/ML ophthalmic solution Place 1 drop into both eyes 2 (two) times daily.      FIBER PO Take 1 capsule by mouth 2 (two) times daily.     fluticasone  (FLONASE ) 50 MCG/ACT nasal spray Place 2 sprays into both nostrils daily. 9.9 mL 2   gabapentin  (NEURONTIN ) 600 MG tablet Take 1 tablet (600 mg total) by mouth in the morning and at bedtime. 180 tablet 6   loratadine  (CLARITIN ) 10 MG tablet Take 1 tablet (10 mg total) by mouth daily. 30 tablet 11   meloxicam  (MOBIC ) 15 MG tablet Take 1 tablet (15 mg total) by mouth daily. 30 tablet 2   metroNIDAZOLE  (METROGEL ) 0.75 % gel Apply 1 application. topically 2 (two) times daily. (  Patient taking differently: Apply 1 application  topically See admin instructions. Apply to affected areas of the face 2 times a day) 45 g 11   Omega-3 Fatty Acids (OMEGA-3 FISH OIL ) 300 MG CAPS Take 300 mg by mouth daily.     ondansetron  (ZOFRAN ) 4 MG tablet Take 1 tablet (4 mg total) by mouth every 8 (eight) hours as needed for nausea or vomiting. (Patient not taking: Reported on 04/28/2023) 20 tablet 0   ONE DAILY MULTIPLE VITAMIN PO Take 1 tablet by mouth daily.      rosuvastatin  (CRESTOR ) 20 MG tablet Take 1 tablet (20 mg total) by mouth daily. 90 tablet 3   traZODone  (DESYREL ) 50 MG tablet TAKE 1/2 TO 1 (ONE-HALF TO ONE) TABLET BY MOUTH AT BEDTIME AS NEEDED FOR SLEEP 90 tablet 3   triamcinolone  cream (KENALOG ) 0.1  % Apply 1 Application topically 2 (two) times daily. 30 g 0   vitamin C (ASCORBIC ACID ) 500 MG tablet Take 500 mg by mouth daily.     No current facility-administered medications on file prior to visit.   No Known Allergies Social History   Socioeconomic History   Marital status: Married    Spouse name: Not on file   Number of children: Not on file   Years of education: Not on file   Highest education level: Some college, no degree  Occupational History   Not on file  Tobacco Use   Smoking status: Former    Current packs/day: 0.00    Types: Cigarettes    Quit date: 03/09/1981    Years since quitting: 42.2   Smokeless tobacco: Never  Vaping Use   Vaping status: Never Used  Substance and Sexual Activity   Alcohol use: Yes    Alcohol/week: 2.0 standard drinks of alcohol    Types: 2 Cans of beer per week    Comment: occasionally   Drug use: No   Sexual activity: Not Currently  Other Topics Concern   Not on file  Social History Narrative   Not on file   Social Drivers of Health   Financial Resource Strain: Low Risk  (04/28/2023)   Overall Financial Resource Strain (CARDIA)    Difficulty of Paying Living Expenses: Not hard at all  Food Insecurity: No Food Insecurity (04/28/2023)   Hunger Vital Sign    Worried About Running Out of Food in the Last Year: Never true    Ran Out of Food in the Last Year: Never true  Transportation Needs: No Transportation Needs (04/28/2023)   PRAPARE - Administrator, Civil Service (Medical): No    Lack of Transportation (Non-Medical): No  Physical Activity: Insufficiently Active (04/28/2023)   Exercise Vital Sign    Days of Exercise per Week: 2 days    Minutes of Exercise per Session: 30 min  Stress: No Stress Concern Present (04/28/2023)   [employer] of Occupational Health - Occupational Stress Questionnaire    Feeling of Stress : Not at all  Social Connections: Moderately Integrated (04/28/2023)   Social Connection  and Isolation Panel [NHANES]    Frequency of Communication with Friends and Family: Three times a week    Frequency of Social Gatherings with Friends and Family: Twice a week    Attends Religious Services: Never    Database Administrator or Organizations: Yes    Attends Engineer, Structural: More than 4 times per year    Marital Status: Married  Catering Manager Violence: Not At Risk (  04/28/2023)   Humiliation, Afraid, Rape, and Kick questionnaire    Fear of Current or Ex-Partner: No    Emotionally Abused: No    Physically Abused: No    Sexually Abused: No   Family History  Problem Relation Age of Onset   Arthritis Mother    Heart disease Mother    Hyperlipidemia Mother    Hypertension Mother    Stroke Mother    Vision loss Mother    Arthritis Father    Depression Father    Heart disease Father    Hyperlipidemia Father    Stroke Father    Arthritis Sister    Cancer Sister    Diabetes Sister    Hyperlipidemia Sister    Arthritis Brother    Diabetes Brother    Heart disease Brother    Hyperlipidemia Brother    Hypertension Brother    Stomach cancer Maternal Grandfather    Colon cancer Neg Hx    Esophageal cancer Neg Hx    Rectal cancer Neg Hx      Review of Systems  Respiratory:  Positive for cough.   Skin:  Positive for rash.  All other systems reviewed and are negative.      Objective:   Physical Exam Vitals reviewed.  Constitutional:      General: He is not in acute distress.    Appearance: Normal appearance. He is not ill-appearing, toxic-appearing or diaphoretic.  HENT:     Head: Normocephalic and atraumatic.     Right Ear: Tympanic membrane and ear canal normal.     Left Ear: Tympanic membrane and ear canal normal.     Nose: No congestion or rhinorrhea.     Mouth/Throat:     Pharynx: No oropharyngeal exudate or posterior oropharyngeal erythema.  Neck:     Thyroid : Thyromegaly present.  Cardiovascular:     Rate and Rhythm: Normal rate and  regular rhythm.     Pulses:          Dorsalis pedis pulses are 0 on the left side.     Heart sounds: Normal heart sounds. No murmur heard.    No friction rub. No gallop.  Pulmonary:     Effort: Pulmonary effort is normal. No respiratory distress.     Breath sounds: No stridor. Wheezing and rhonchi present. No rales.  Chest:     Chest wall: No tenderness.  Lymphadenopathy:     Cervical: No cervical adenopathy.  Skin:    General: Skin is warm.     Findings: No rash.  Neurological:     General: No focal deficit present.     Mental Status: He is alert and oriented to person, place, and time. Mental status is at baseline.           Assessment & Plan:  Bronchitis Patient is dealing with viral bronchitis.  He has been using Delsym and Mucinex  without success.  Due to the wheezing and bronchospasm I am going to start him on prednisone  to help alleviate the bronchospasm.  Also gave him Hycodan 1 teaspoon every 6 hours as needed for cough.  I believe that this will gradually improve over the next week.  If he develops high fever greater than 101, purulent sputum, or shortness of breath, I want him to fill the prescription for azithromycin  which I gave him to cover bacterial bronchitis.  However at the present time I believe that this is only viral and needs to run its course

## 2023-06-11 ENCOUNTER — Ambulatory Visit
Admission: RE | Admit: 2023-06-11 | Discharge: 2023-06-11 | Disposition: A | Discharge status: Home / Self Care | Payer: Medicare Other | Source: Ambulatory Visit | Attending: Neurosurgery | Admitting: Neurosurgery

## 2023-06-11 DIAGNOSIS — D497 Neoplasm of unspecified behavior of endocrine glands and other parts of nervous system: Secondary | ICD-10-CM

## 2023-06-11 DIAGNOSIS — Z09 Encounter for follow-up examination after completed treatment for conditions other than malignant neoplasm: Secondary | ICD-10-CM | POA: Diagnosis not present

## 2023-06-11 DIAGNOSIS — D352 Benign neoplasm of pituitary gland: Secondary | ICD-10-CM | POA: Diagnosis not present

## 2023-06-11 MED ORDER — GADOPICLENOL 0.5 MMOL/ML IV SOLN
8.0000 mL | Freq: Once | INTRAVENOUS | Status: AC | PRN
  Administered 2023-06-11: 8 mL via INTRAVENOUS

## 2023-06-20 DIAGNOSIS — D497 Neoplasm of unspecified behavior of endocrine glands and other parts of nervous system: Secondary | ICD-10-CM | POA: Diagnosis not present

## 2023-06-20 DIAGNOSIS — Z6826 Body mass index (BMI) 26.0-26.9, adult: Secondary | ICD-10-CM | POA: Diagnosis not present

## 2023-06-26 ENCOUNTER — Encounter: Payer: Self-pay | Admitting: Family Medicine

## 2023-06-27 ENCOUNTER — Other Ambulatory Visit: Payer: Self-pay

## 2023-06-27 DIAGNOSIS — E7800 Pure hypercholesterolemia, unspecified: Secondary | ICD-10-CM

## 2023-06-28 ENCOUNTER — Other Ambulatory Visit: Payer: Self-pay | Admitting: Family Medicine

## 2023-06-28 DIAGNOSIS — E7800 Pure hypercholesterolemia, unspecified: Secondary | ICD-10-CM

## 2023-06-28 NOTE — Telephone Encounter (Signed)
Copied from CRM [id number]. Topic: Clinical - Medication Refill >> Jun 28, 2023  4:42 PM Gery Pray wrote: Most Recent Primary Care Visit:  Provider: Lynnea Ferrier T  Department: [facility]  Visit Type: ACUTE  Date: 05/23/2023  Medication: rosuvastatin (CRESTOR) 20 MG tablet   Has the patient contacted their pharmacy? Yes (Agent: If no, request that the patient contact the pharmacy for the refill. If patient does not wish to contact the pharmacy document the reason why and proceed with request.) (Agent: If yes, when and what did the pharmacy advise?)  Is this the correct pharmacy for this prescription? Yes If no, delete pharmacy and type the correct one.  This is the patient's preferred pharmacy:  [facility] [address], [place] - [place], [place] [address]   Has the prescription been filled recently? No  Is the patient out of the medication? Yes  Has the patient been seen for an appointment in the last year OR does the patient have an upcoming appointment? Yes  Can we respond through MyChart? No  Agent: Please be advised that Rx refills may take up to 3 business days. We ask that you follow-up with your pharmacy.   Please call [phone] opt 2  ref  # [phone] in regards to refill

## 2023-06-29 ENCOUNTER — Other Ambulatory Visit: Payer: Self-pay

## 2023-06-29 DIAGNOSIS — E7800 Pure hypercholesterolemia, unspecified: Secondary | ICD-10-CM

## 2023-06-29 MED ORDER — ROSUVASTATIN CALCIUM 20 MG PO TABS: 20.0000 mg | ORAL_TABLET | Freq: Every day | ORAL | 3 refills | Status: AC

## 2023-07-20 DIAGNOSIS — L720 Epidermal cyst: Secondary | ICD-10-CM | POA: Diagnosis not present

## 2023-07-20 DIAGNOSIS — D225 Melanocytic nevi of trunk: Secondary | ICD-10-CM | POA: Diagnosis not present

## 2023-07-20 DIAGNOSIS — L814 Other melanin hyperpigmentation: Secondary | ICD-10-CM | POA: Diagnosis not present

## 2023-07-20 DIAGNOSIS — L821 Other seborrheic keratosis: Secondary | ICD-10-CM | POA: Diagnosis not present

## 2023-07-24 ENCOUNTER — Encounter: Payer: Self-pay | Admitting: Family Medicine

## 2023-07-25 ENCOUNTER — Other Ambulatory Visit: Payer: Self-pay

## 2023-07-25 DIAGNOSIS — G6289 Other specified polyneuropathies: Secondary | ICD-10-CM

## 2023-07-25 DIAGNOSIS — F5104 Psychophysiologic insomnia: Secondary | ICD-10-CM

## 2023-07-25 MED ORDER — GABAPENTIN 600 MG PO TABS: 600.0000 mg | ORAL_TABLET | Freq: Two times a day (BID) | ORAL | 1 refills | Status: AC

## 2023-08-09 DIAGNOSIS — Z23 Encounter for immunization: Secondary | ICD-10-CM | POA: Diagnosis not present

## 2023-08-26 ENCOUNTER — Encounter: Payer: Self-pay | Admitting: Family Medicine

## 2023-08-26 ENCOUNTER — Other Ambulatory Visit: Payer: Self-pay

## 2023-08-26 MED ORDER — TRAZODONE HCL 50 MG PO TABS
ORAL_TABLET | ORAL | 3 refills | Status: AC
Start: 2023-08-26 — End: ?

## 2023-08-31 DIAGNOSIS — H401222 Low-tension glaucoma, left eye, moderate stage: Secondary | ICD-10-CM | POA: Diagnosis not present

## 2023-09-01 ENCOUNTER — Encounter: Payer: Self-pay | Admitting: Family Medicine

## 2023-09-01 ENCOUNTER — Ambulatory Visit: Payer: Medicare Other

## 2023-09-01 ENCOUNTER — Ambulatory Visit: Payer: Medicare Other | Admitting: Family Medicine

## 2023-09-01 VITALS — BP 132/76 | HR 40 | Temp 97.7°F | Ht 68.0 in | Wt 169.4 lb

## 2023-09-01 DIAGNOSIS — D352 Benign neoplasm of pituitary gland: Secondary | ICD-10-CM | POA: Diagnosis not present

## 2023-09-01 DIAGNOSIS — Z8546 Personal history of malignant neoplasm of prostate: Secondary | ICD-10-CM | POA: Diagnosis not present

## 2023-09-01 DIAGNOSIS — E7800 Pure hypercholesterolemia, unspecified: Secondary | ICD-10-CM

## 2023-09-01 DIAGNOSIS — Z0001 Encounter for general adult medical examination with abnormal findings: Secondary | ICD-10-CM

## 2023-09-01 DIAGNOSIS — Z0000 Encounter for general adult medical examination without abnormal findings: Secondary | ICD-10-CM

## 2023-09-01 MED ORDER — MELOXICAM 15 MG PO TABS: 15.0000 mg | ORAL_TABLET | Freq: Every day | ORAL | 5 refills | Status: AC

## 2023-09-01 NOTE — Progress Notes (Signed)
 Subjective:    Patient ID: Kyle Wilcox, male    DOB: 05-26-1950, 73 y.o.   MRN: [id number]  HPI Patient is a very pleasant 73 year old Caucasian male here today for his CPE.   He does have a remote history of smoking.  He began smoking in the late 60s and quit in the early 80s.  He was screened for AAA in 2017 and this was normal.  He does not have sufficient smoking history to warrant a CT scan of the lungs for lung cancer.  His last colonoscopy was in 2018 and was normal without any polyps or irregularities.  He does have a history of prostate cancer and had his prostate removed.  Since I last saw the patient, he was diagnosed with a neuroendocrine tumor on his pituitary gland.  He underwent surgery to have this resected.  Patient is requesting a refill on his meloxicam.  He uses this as needed for pain in his neck.  Arthritis.  We discussed the GI toxicity and potential renal toxicity of this medication.  He is aware to take the medication only as needed.  He is also on gabapentin.  He states that has been on gabapentin for years for pain in his feet.  However he absolutely is any nerve pain in his feet today.  He denies any numbness or tingling.  He denies any burning or stinging.  He denies any shooting nervelike pain.  He denies any pins-and-needles.  Therefore I question if he truly needs to continue the gabapentin. Reviewing his vaccines today, he is due for the RSV vaccine.   Immunization History  Administered Date(s) Administered   Fluad Quad(high Dose 65+) 02/17/2022   Influenza, High Dose Seasonal PF 02/23/2016, 01/05/2017, 01/12/2019   Influenza,inj,Quad PF,6+ Mos 03/10/2015, 02/08/2018   Influenza-Unspecified 01/16/2021, 02/17/2022, 01/10/2023   PFIZER Comirnaty(Gray Top)Covid-19 Tri-Sucrose Vaccine 08/16/2020   PFIZER(Purple Top)SARS-COV-2 Vaccination 09/12/2019, 10/02/2019, 04/04/2020, 08/16/2020, 02/11/2021   Pfizer Covid-19 Vaccine Bivalent Booster 34yrs & up 08/16/2020,  02/11/2021, 11/30/2021   Pneumococcal Conjugate-13 11/07/2014, 01/05/2017   Pneumococcal Polysaccharide-23 03/17/2016   Tdap 03/11/2015   Unspecified SARS-COV-2 Vaccination 01/10/2023   Zoster Recombinant(Shingrix) 05/29/2018, 09/26/2018   No visits with results within 1 Month(s) from this visit.  Latest known visit with results is:  Lab on 08/09/2022  Component Date Value Ref Range Status   Glucose, Bld 08/09/2022 92  65 - 99 mg/dL Final   Comment: .            Fasting reference interval .    BUN 08/09/2022 15  7 - 25 mg/dL Final   Creat 78/29/5621 0.96  0.70 - 1.28 mg/dL Final   BUN/Creatinine Ratio 08/09/2022 SEE NOTE:  6 - 22 (calc) Final   Comment:    Not Reported: BUN and Creatinine are within    reference range. .    Sodium 08/09/2022 143  135 - 146 mmol/L Final   Potassium 08/09/2022 4.7  3.5 - 5.3 mmol/L Final   Chloride 08/09/2022 105  98 - 110 mmol/L Final   CO2 08/09/2022 31  20 - 32 mmol/L Final   Calcium 08/09/2022 10.0  8.6 - 10.3 mg/dL Final   Total Protein 30/86/5784 6.8  6.1 - 8.1 g/dL Final   Albumin 69/62/9528 4.3  3.6 - 5.1 g/dL Final   Globulin 41/32/4401 2.5  1.9 - 3.7 g/dL (calc) Final   AG Ratio 08/09/2022 1.7  1.0 - 2.5 (calc) Final   Total Bilirubin 08/09/2022 0.3  0.2 -  1.2 mg/dL Final   Alkaline phosphatase (APISO) 08/09/2022 86  35 - 144 U/L Final   AST 08/09/2022 17  10 - 35 U/L Final   ALT 08/09/2022 18  9 - 46 U/L Final     Past Medical History:  Diagnosis Date   Allergy 2014   Arthritis    Phreesia 02/16/2020   Cancer (HCC) 2015   prostate cancer   Cataract 2016   COVID-19 05/2019   COVID-19 05/2019   DDD (degenerative disc disease), lumbar 12/24/2014   GERD (gastroesophageal reflux disease)    Glaucoma    Hx of colonic polyps 02/19/2011   Hyperlipidemia 11/07/2014   Peripheral neuropathy 12/24/2014   Past Surgical History:  Procedure Laterality Date   COLONOSCOPY     CRANIOTOMY N/A 10/20/2021   Procedure: Endoscopic transphenoidal  resection of pituitary tumor;  Surgeon: Audie Bleacher, MD;  Location: [facility] OR;  Service: Neurosurgery;  Laterality: N/A;   EYE SURGERY     laser surgery to relieve pressure on eyes- both eyes   JOINT REPLACEMENT  2011   total shoulder replacement (Right)   LAPAROTOMY N/A 01/06/2018   Procedure: EXPLORATORY LAPAROTOMY;  Surgeon: Jerryl Morin, MD;  Location: [facility] OR;  Service: General;  Laterality: N/A;   LYSIS OF ADHESION N/A 01/06/2018   Procedure: LYSIS OF ADHESION;  Surgeon: Jerryl Morin, MD;  Location: [facility] OR;  Service: General;  Laterality: N/A;   PROSTATE SURGERY     2015   SMALL INTESTINE SURGERY N/A    Phreesia 02/16/2020   TONSILLECTOMY     TOTAL SHOULDER ARTHROPLASTY Left 03/04/2020   Procedure: TOTAL SHOULDER ARTHROPLASTY;  Surgeon: Osa Blase, MD;  Location: [facility];  Service: Orthopedics;  Laterality: Left;   TRANSPHENOIDAL APPROACH EXPOSURE N/A 10/20/2021   Procedure: TRANSPHENOIDAL APPROACH EXPOSURE;  Surgeon: Ammon Bales, MD;  Location: [facility] OR;  Service: ENT;  Laterality: N/A;   VASECTOMY  1982   Current Outpatient Medications on File Prior to Visit  Medication Sig Dispense Refill   aspirin 81 MG EC tablet Take 81 mg by mouth daily. Swallow whole.     azithromycin (ZITHROMAX) 250 MG tablet 2 tabs poqday1, 1 tab poqday 2-5 6 tablet 0   diphenhydrAMINE (BENADRYL) 25 MG tablet Take 25 mg by mouth daily as needed for allergies.     dorzolamide-timolol (COSOPT) 22.3-6.8 MG/ML ophthalmic solution Place 1 drop into both eyes 2 (two) times daily.      FIBER PO Take 1 capsule by mouth 2 (two) times daily.     fluticasone (FLONASE) 50 MCG/ACT nasal spray Place 2 sprays into both nostrils daily. 9.9 mL 2   gabapentin (NEURONTIN) 600 MG tablet Take 1 tablet (600 mg total) by mouth in the morning and at bedtime. 180 tablet 1   HYDROcodone bit-homatropine (HYCODAN) 5-1.5 MG/5ML syrup Take 5 mLs by mouth every 8 (eight) hours as needed for cough. 120 mL 0   loratadine (CLARITIN) 10 MG  tablet Take 1 tablet (10 mg total) by mouth daily. 30 tablet 11   meloxicam (MOBIC) 15 MG tablet Take 1 tablet (15 mg total) by mouth daily. 30 tablet 2   metroNIDAZOLE (METROGEL) 0.75 % gel Apply 1 application. topically 2 (two) times daily. (Patient taking differently: Apply 1 application  topically See admin instructions. Apply to affected areas of the face 2 times a day) 45 g 11   Omega-3 Fatty Acids (OMEGA-3 FISH OIL) 300 MG CAPS Take 300 mg by mouth daily.     ondansetron (ZOFRAN)  4 MG tablet Take 1 tablet (4 mg total) by mouth every 8 (eight) hours as needed for nausea or vomiting. 20 tablet 0   ONE DAILY MULTIPLE VITAMIN PO Take 1 tablet by mouth daily.      rosuvastatin (CRESTOR) 20 MG tablet Take 1 tablet (20 mg total) by mouth daily. 90 tablet 3   traZODone (DESYREL) 50 MG tablet TAKE 1/2 TO 1 (ONE-HALF TO ONE) TABLET BY MOUTH AT BEDTIME AS NEEDED FOR SLEEP 90 tablet 3   triamcinolone cream (KENALOG) 0.1 % Apply 1 Application topically 2 (two) times daily. 30 g 0   vitamin C (ASCORBIC ACID) 500 MG tablet Take 500 mg by mouth daily.     No current facility-administered medications on file prior to visit.   No Known Allergies Social History   Socioeconomic History   Marital status: Married    Spouse name: Not on file   Number of children: Not on file   Years of education: Not on file   Highest education level: Some college, no degree  Occupational History   Not on file  Tobacco Use   Smoking status: Former    Current packs/day: 0.00    Types: Cigarettes    Quit date: 03/09/1981    Years since quitting: 42.5   Smokeless tobacco: Never  Vaping Use   Vaping status: Never Used  Substance and Sexual Activity   Alcohol use: Yes    Alcohol/week: 2.0 standard drinks of alcohol    Types: 2 Cans of beer per week    Comment: occasionally   Drug use: No   Sexual activity: Not Currently  Other Topics Concern   Not on file  Social History Narrative   Not on file   Social  Drivers of Health   Financial Resource Strain: Low Risk  (04/28/2023)   Overall Financial Resource Strain (CARDIA)    Difficulty of Paying Living Expenses: Not hard at all  Food Insecurity: No Food Insecurity (04/28/2023)   Hunger Vital Sign    Worried About Running Out of Food in the Last Year: Never true    Ran Out of Food in the Last Year: Never true  Transportation Needs: No Transportation Needs (04/28/2023)   PRAPARE - Administrator, Civil Service (Medical): No    Lack of Transportation (Non-Medical): No  Physical Activity: Insufficiently Active (04/28/2023)   Exercise Vital Sign    Days of Exercise per Week: 2 days    Minutes of Exercise per Session: 30 min  Stress: No Stress Concern Present (04/28/2023)   [employer] of Occupational Health - Occupational Stress Questionnaire    Feeling of Stress : Not at all  Social Connections: Moderately Integrated (04/28/2023)   Social Connection and Isolation Panel [NHANES]    Frequency of Communication with Friends and Family: Three times a week    Frequency of Social Gatherings with Friends and Family: Twice a week    Attends Religious Services: Never    Database administrator or Organizations: Yes    Attends Engineer, structural: More than 4 times per year    Marital Status: Married  Catering manager Violence: Not At Risk (04/28/2023)   Humiliation, Afraid, Rape, and Kick questionnaire    Fear of Current or Ex-Partner: No    Emotionally Abused: No    Physically Abused: No    Sexually Abused: No   Family History  Problem Relation Age of Onset   Arthritis Mother    Heart disease  Mother    Hyperlipidemia Mother    Hypertension Mother    Stroke Mother    Vision loss Mother    Arthritis Father    Depression Father    Heart disease Father    Hyperlipidemia Father    Stroke Father    Arthritis Sister    Cancer Sister    Diabetes Sister    Hyperlipidemia Sister    Arthritis Brother    Diabetes  Brother    Heart disease Brother    Hyperlipidemia Brother    Hypertension Brother    Stomach cancer Maternal Grandfather    Colon cancer Neg Hx    Esophageal cancer Neg Hx    Rectal cancer Neg Hx      Review of Systems  All other systems reviewed and are negative.      Objective:   Physical Exam Vitals reviewed.  Constitutional:      General: He is not in acute distress.    Appearance: Normal appearance. He is not ill-appearing, toxic-appearing or diaphoretic.  HENT:     Head: Normocephalic and atraumatic.     Right Ear: Tympanic membrane, ear canal and external ear normal.     Left Ear: Tympanic membrane, ear canal and external ear normal.     Nose: Nose normal. No congestion or rhinorrhea.     Mouth/Throat:     Mouth: Mucous membranes are moist.     Pharynx: Oropharynx is clear. No oropharyngeal exudate or posterior oropharyngeal erythema.  Eyes:     General: No scleral icterus.       Right eye: No discharge.        Left eye: No discharge.     Extraocular Movements: Extraocular movements intact.     Conjunctiva/sclera: Conjunctivae normal.     Pupils: Pupils are equal, round, and reactive to light.  Neck:     Thyroid: No thyromegaly.     Vascular: No carotid bruit.  Cardiovascular:     Rate and Rhythm: Normal rate and regular rhythm.     Pulses:          Dorsalis pedis pulses are 0 on the left side.     Heart sounds: Normal heart sounds. No murmur heard.    No friction rub. No gallop.  Pulmonary:     Effort: Pulmonary effort is normal. No respiratory distress.     Breath sounds: Normal breath sounds. No stridor. No wheezing, rhonchi or rales.  Chest:     Chest wall: No tenderness.  Abdominal:     General: Abdomen is flat. Bowel sounds are normal. There is no distension.     Palpations: Abdomen is soft. There is no mass.     Tenderness: There is no abdominal tenderness. There is no right CVA tenderness, left CVA tenderness, guarding or rebound.     Hernia: No  hernia is present.  Musculoskeletal:     Cervical back: Normal range of motion and neck supple.     Right lower leg: No edema.     Left lower leg: No edema.  Lymphadenopathy:     Cervical: No cervical adenopathy.  Skin:    General: Skin is warm.     Coloration: Skin is not jaundiced or pale.     Findings: No bruising, erythema, lesion or rash.  Neurological:     General: No focal deficit present.     Mental Status: He is alert and oriented to person, place, and time. Mental status is at baseline.  Cranial Nerves: No cranial nerve deficit.     Sensory: No sensory deficit.     Motor: No weakness.     Coordination: Coordination normal.     Gait: Gait normal.     Deep Tendon Reflexes: Reflexes normal.  Psychiatric:        Mood and Affect: Mood normal.        Behavior: Behavior normal.        Thought Content: Thought content normal.        Judgment: Judgment normal.           Assessment & Plan:  General medical exam  History of prostate cancer - Plan: PSA  Pituitary adenoma (HCC)  Pure hypercholesterolemia - Plan: CBC with Differential/Platelet, COMPLETE METABOLIC PANEL WITHOUT GFR, Lipid panel Blood pressure is excellent today.  Immunizations are up-to-date except for the RSV vaccine.  I counted his heart rate to be 56 bpm.  He denies any syncope or near syncope.  I will check a PSA to screen for prostate cancer.  His colonoscopy is not due until 2028.  Neurosurgery is monitoring an MRI annually which has been stable since 2023.  I will check a CBC, CMP, and a fasting lipid panel he will have like his LDL cholesterol to be less than 100.  Recommended the patient use meloxicam sparingly as needed for joint pain.  Recommended decreasing gabapentin to 300 mg twice daily and gradually trying to wean off this medication over the next 4 weeks unless he notices a change once he decreases the dose

## 2023-09-02 LAB — COMPLETE METABOLIC PANEL WITHOUT GFR
AG Ratio: 1.8 (calc) (ref 1.0–2.5)
ALT: 19 U/L (ref 9–46)
AST: 21 U/L (ref 10–35)
Albumin: 4.6 g/dL (ref 3.6–5.1)
Alkaline phosphatase (APISO): 83 U/L (ref 35–144)
BUN: 17 mg/dL (ref 7–25)
CO2: 29 mmol/L (ref 20–32)
Calcium: 10.1 mg/dL (ref 8.6–10.3)
Chloride: 104 mmol/L (ref 98–110)
Creat: 0.89 mg/dL (ref 0.70–1.28)
Globulin: 2.5 g/dL (ref 1.9–3.7)
Glucose, Bld: 94 mg/dL (ref 65–99)
Potassium: 5 mmol/L (ref 3.5–5.3)
Sodium: 142 mmol/L (ref 135–146)
Total Bilirubin: 0.3 mg/dL (ref 0.2–1.2)
Total Protein: 7.1 g/dL (ref 6.1–8.1)

## 2023-09-02 LAB — CBC WITH DIFFERENTIAL/PLATELET
Absolute Lymphocytes: 2002 {cells}/uL (ref 850–3900)
Absolute Monocytes: 552 {cells}/uL (ref 200–950)
Basophils Absolute: 48 {cells}/uL (ref 0–200)
Basophils Relative: 1 %
Eosinophils Absolute: 182 {cells}/uL (ref 15–500)
Eosinophils Relative: 3.8 %
HCT: 44.3 % (ref 38.5–50.0)
Hemoglobin: 14.9 g/dL (ref 13.2–17.1)
MCH: 29.9 pg (ref 27.0–33.0)
MCHC: 33.6 g/dL (ref 32.0–36.0)
MCV: 88.8 fL (ref 80.0–100.0)
MPV: 10.7 fL (ref 7.5–12.5)
Monocytes Relative: 11.5 %
Neutro Abs: 2016 {cells}/uL (ref 1500–7800)
Neutrophils Relative %: 42 %
Platelets: 233 10*3/uL (ref 140–400)
RBC: 4.99 10*6/uL (ref 4.20–5.80)
RDW: 13.6 % (ref 11.0–15.0)
Total Lymphocyte: 41.7 %
WBC: 4.8 10*3/uL (ref 3.8–10.8)

## 2023-09-02 LAB — LIPID PANEL
Cholesterol: 179 mg/dL (ref <200)
HDL: 37 mg/dL — ABNORMAL LOW (ref >=40)
LDL Cholesterol (Calc): 110 mg/dL — ABNORMAL HIGH
Non-HDL Cholesterol (Calc): 142 mg/dL — ABNORMAL HIGH (ref <130)
Total CHOL/HDL Ratio: 4.8 (calc) (ref <5.0)
Triglycerides: 206 mg/dL — ABNORMAL HIGH (ref <150)

## 2023-09-02 LAB — PSA: PSA: <0.04 ng/mL (ref <=4.00)

## 2023-10-12 ENCOUNTER — Ambulatory Visit: Payer: Self-pay

## 2023-10-12 NOTE — Telephone Encounter (Signed)
 Chief Complaint: diarrhea Symptoms: diarrhea Frequency: x 1 week Pertinent Negatives: Patient denies abd Disposition: [] ED /[] Urgent Care (no appt availability in office) / [x] Appointment(In office/virtual)/ []  [facility]/ [] Home Care/ [] Refused Recommended Disposition /[] [facility]/ []  Follow-up with PCP Additional Notes: pt states that for over a week he had some abd pain that went away after about 2 days. States the diarrhea then started on and off and he took some Pepto and it went away but came back on yesterday.  Pt states on yesterday he had 2 episodes and only 1 was liquid and one was small pellets. States that his abd does feel a little "uneasy".  Reason for Disposition  [1] Mild diarrhea (e.g., 1-3 or more stools than normal in past 24 hours) without known cause AND [2] present >  7 days  Protocols used: Diarrhea-A-AH

## 2023-10-13 ENCOUNTER — Encounter: Payer: Self-pay | Admitting: Family Medicine

## 2023-10-13 ENCOUNTER — Ambulatory Visit (INDEPENDENT_AMBULATORY_CARE_PROVIDER_SITE_OTHER): Admitting: Family Medicine

## 2023-10-13 VITALS — BP 122/73 | HR 47 | Temp 98.2°F | Ht 68.0 in

## 2023-10-13 DIAGNOSIS — A084 Viral intestinal infection, unspecified: Secondary | ICD-10-CM | POA: Insufficient documentation

## 2023-10-13 NOTE — Progress Notes (Signed)
 Subjective:  HPI: Kyle Wilcox is a 73 y.o. male presenting on 10/13/2023 for Acute (Diarrhea x 7 days )   HPI Patient is in today for several days of watery diarrhea starting 1 week ago. Started with abdominal cramping followed by several episodes of diarrhea. Tried Tums and Pepto with some relief and subsequently became constipated. His last bowel movement was two days ago and was loose, normal bowel movements are every other day.  Denies abdominal pain, nausea, vomiting, fever, melena, hematochezia. Did not eat anything out of the ordinary, no recent travel, no new medications. Believes he may be lactose intolerant. Did start magnesium  supplements months ago.  His wife has started experiencing the same symptoms yesterday   Review of Systems  All other systems reviewed and are negative.   Relevant past medical history reviewed and updated as indicated.   Past Medical History:  Diagnosis Date   Allergy 2014   Arthritis    Phreesia 02/16/2020   Cancer (HCC) 2015   prostate cancer   Cataract 2016   COVID-19 05/2019   COVID-19 05/2019   DDD (degenerative disc disease), lumbar 12/24/2014   GERD (gastroesophageal reflux disease)    Glaucoma    Hx of colonic polyps 02/19/2011   Hyperlipidemia 11/07/2014   Peripheral neuropathy 12/24/2014     Past Surgical History:  Procedure Laterality Date   COLONOSCOPY     CRANIOTOMY N/A 10/20/2021   Procedure: Endoscopic transphenoidal resection of pituitary tumor;  Surgeon: Audie Bleacher, MD;  Location: [facility] OR;  Service: Neurosurgery;  Laterality: N/A;   EYE SURGERY     laser surgery to relieve pressure on eyes- both eyes   JOINT REPLACEMENT  2011   total shoulder replacement (Right)   LAPAROTOMY N/A 01/06/2018   Procedure: EXPLORATORY LAPAROTOMY;  Surgeon: Jerryl Morin, MD;  Location: [facility] OR;  Service: General;  Laterality: N/A;   LYSIS OF ADHESION N/A 01/06/2018   Procedure: LYSIS OF ADHESION;  Surgeon: Jerryl Morin, MD;  Location: [facility] OR;   Service: General;  Laterality: N/A;   PROSTATE SURGERY     2015   SMALL INTESTINE SURGERY N/A    Phreesia 02/16/2020   TONSILLECTOMY     TOTAL SHOULDER ARTHROPLASTY Left 03/04/2020   Procedure: TOTAL SHOULDER ARTHROPLASTY;  Surgeon: Osa Blase, MD;  Location: [facility] ORS;  Service: Orthopedics;  Laterality: Left;   TRANSPHENOIDAL APPROACH EXPOSURE N/A 10/20/2021   Procedure: TRANSPHENOIDAL APPROACH EXPOSURE;  Surgeon: Ammon Bales, MD;  Location: [facility] OR;  Service: ENT;  Laterality: N/A;   VASECTOMY  1982    Allergies and medications reviewed and updated.   Current Outpatient Medications:    aspirin 81 MG EC tablet, Take 81 mg by mouth daily. Swallow whole., Disp: , Rfl:    diphenhydrAMINE  (BENADRYL ) 25 MG tablet, Take 25 mg by mouth daily as needed for allergies., Disp: , Rfl:    dorzolamide -timolol  (COSOPT ) 22.3-6.8 MG/ML ophthalmic solution, Place 1 drop into both eyes 2 (two) times daily. , Disp: , Rfl:    FIBER PO, Take 1 capsule by mouth 2 (two) times daily., Disp: , Rfl:    fluticasone  (FLONASE ) 50 MCG/ACT nasal spray, Place 2 sprays into both nostrils daily., Disp: 9.9 mL, Rfl: 2   gabapentin  (NEURONTIN ) 600 MG tablet, Take 1 tablet (600 mg total) by mouth in the morning and at bedtime., Disp: 180 tablet, Rfl: 1   loratadine  (CLARITIN ) 10 MG tablet, Take 1 tablet (10 mg total) by mouth daily., Disp: 30 tablet, Rfl: 11  Magnesium  Oxide 420 MG TABS, Take 1 tablet by mouth daily., Disp: , Rfl:    meloxicam  (MOBIC ) 15 MG tablet, Take 1 tablet (15 mg total) by mouth daily., Disp: 30 tablet, Rfl: 5   metroNIDAZOLE  (METROGEL ) 0.75 % gel, Apply 1 application. topically 2 (two) times daily. (Patient taking differently: Apply 1 application  topically See admin instructions. Apply to affected areas of the face 2 times a day), Disp: 45 g, Rfl: 11   ondansetron  (ZOFRAN ) 4 MG tablet, Take 1 tablet (4 mg total) by mouth every 8 (eight) hours as needed for nausea or vomiting., Disp: 20 tablet,  Rfl: 0   ONE DAILY MULTIPLE VITAMIN PO, Take 1 tablet by mouth daily. , Disp: , Rfl:    rosuvastatin  (CRESTOR ) 20 MG tablet, Take 1 tablet (20 mg total) by mouth daily., Disp: 90 tablet, Rfl: 3   traZODone  (DESYREL ) 50 MG tablet, TAKE 1/2 TO 1 (ONE-HALF TO ONE) TABLET BY MOUTH AT BEDTIME AS NEEDED FOR SLEEP, Disp: 90 tablet, Rfl: 3   vitamin C (ASCORBIC ACID ) 500 MG tablet, Take 500 mg by mouth daily., Disp: , Rfl:   No Known Allergies  Objective:   BP 122/73   Pulse (!) 47   Temp 98.2 F (36.8 C)   Ht 5\' 8"  (1.727 m)   SpO2 97%   BMI 25.76 kg/m      10/13/2023    8:31 AM 10/13/2023    8:22 AM 09/01/2023   11:25 AM  Vitals with BMI  Height  5\' 8"  5\' 8"   Weight   169 lbs 6 oz  BMI   25.76  Systolic  122 132  Diastolic  73 76  Pulse 47 45 40     Physical Exam Vitals and nursing note reviewed.  Constitutional:      Appearance: Normal appearance. He is normal weight.  HENT:     Head: Normocephalic and atraumatic.  Abdominal:     General: Bowel sounds are normal. There is no distension.     Palpations: Abdomen is soft.     Tenderness: There is no abdominal tenderness.     Hernia: A hernia is present.  Skin:    General: Skin is warm and dry.     Capillary Refill: Capillary refill takes less than 2 seconds.  Neurological:     General: No focal deficit present.     Mental Status: He is alert and oriented to person, place, and time. Mental status is at baseline.  Psychiatric:        Mood and Affect: Mood normal.        Behavior: Behavior normal.        Thought Content: Thought content normal.        Judgment: Judgment normal.     Assessment & Plan:  Viral gastroenteritis Assessment & Plan: Resolving episode of likely viral gastroenteritis given his wife is now experiencing similar symptoms. Mr Kyle Wilcox symptoms are resolved. Discussed importance of electrolyte fluid repletion during episodes of vomiting and diarrhea. May try elimination diet to assess lactose tolerance.  OK to resume OTC miralax  and fiber as needed. Follow up PRN.       Follow up plan: Return if symptoms worsen or fail to improve.  Jenelle Mis, FNP

## 2023-10-13 NOTE — Assessment & Plan Note (Signed)
 Resolving episode of likely viral gastroenteritis given his wife is now experiencing similar symptoms. Kyle Wilcox symptoms are resolved. Discussed importance of electrolyte fluid repletion during episodes of vomiting and diarrhea. May try elimination diet to assess lactose tolerance. OK to resume OTC miralax  and fiber as needed. Follow up PRN.

## 2024-01-04 DIAGNOSIS — H401222 Low-tension glaucoma, left eye, moderate stage: Secondary | ICD-10-CM | POA: Diagnosis not present

## 2024-01-05 ENCOUNTER — Encounter: Payer: Self-pay | Admitting: Family Medicine

## 2024-01-09 DIAGNOSIS — Z23 Encounter for immunization: Secondary | ICD-10-CM | POA: Diagnosis not present

## 2024-01-20 ENCOUNTER — Ambulatory Visit (INDEPENDENT_AMBULATORY_CARE_PROVIDER_SITE_OTHER): Admitting: Family Medicine

## 2024-01-20 ENCOUNTER — Encounter: Payer: Self-pay | Admitting: Family Medicine

## 2024-01-20 VITALS — BP 118/62 | HR 50 | Temp 97.8°F | Ht 68.0 in | Wt 173.0 lb

## 2024-01-20 DIAGNOSIS — Z91014 Allergy to mammalian meats: Secondary | ICD-10-CM | POA: Diagnosis not present

## 2024-01-20 NOTE — Progress Notes (Signed)
 Subjective:    Patient ID: Kyle Wilcox, male    DOB: 1950-10-16, 73 y.o.   MRN: [id number]  Patient has a history of alpha gal allergy.  He recently ate 4 small bites of pork and did not have a reaction.  He would like to recheck his blood levels to see if he still has elevated antibody levels to mammalian meat.  He has not had a vigorous allergic reaction in quite some time.  He denies any hives.  Patient is bradycardic on exam however he is completely asymptomatic.  He denies any lightheadedness or dizziness.  He is not on any medication that would cause bradycardia Past Medical History:  Diagnosis Date   Allergy 2014   Arthritis    Phreesia 02/16/2020   Cancer (HCC) 2015   prostate cancer   Cataract 2016   COVID-19 05/2019   COVID-19 05/2019   DDD (degenerative disc disease), lumbar 12/24/2014   GERD (gastroesophageal reflux disease)    Glaucoma    Hx of colonic polyps 02/19/2011   Hyperlipidemia 11/07/2014   Peripheral neuropathy 12/24/2014   Past Surgical History:  Procedure Laterality Date   COLONOSCOPY     CRANIOTOMY N/A 10/20/2021   Procedure: Endoscopic transphenoidal resection of pituitary tumor;  Surgeon: Gillie Duncans, MD;  Location: [facility] OR;  Service: Neurosurgery;  Laterality: N/A;   EYE SURGERY     laser surgery to relieve pressure on eyes- both eyes   JOINT REPLACEMENT  2011   total shoulder replacement (Right)   LAPAROTOMY N/A 01/06/2018   Procedure: EXPLORATORY LAPAROTOMY;  Surgeon: Kimble Agent, MD;  Location: [facility] OR;  Service: General;  Laterality: N/A;   LYSIS OF ADHESION N/A 01/06/2018   Procedure: LYSIS OF ADHESION;  Surgeon: Kimble Agent, MD;  Location: [facility] OR;  Service: General;  Laterality: N/A;   PROSTATE SURGERY     2015   SMALL INTESTINE SURGERY N/A    Phreesia 02/16/2020   TONSILLECTOMY     TOTAL SHOULDER ARTHROPLASTY Left 03/04/2020   Procedure: TOTAL SHOULDER ARTHROPLASTY;  Surgeon: Josefina Chew, MD;  Location: [facility] ORS;  Service: Orthopedics;   Laterality: Left;   TRANSPHENOIDAL APPROACH EXPOSURE N/A 10/20/2021   Procedure: TRANSPHENOIDAL APPROACH EXPOSURE;  Surgeon: Mable Lenis, MD;  Location: [facility] OR;  Service: ENT;  Laterality: N/A;   VASECTOMY  1982   Current Outpatient Medications on File Prior to Visit  Medication Sig Dispense Refill   amoxicillin (AMOXIL) 500 MG capsule Take 500 mg by mouth 3 (three) times daily.     aspirin 81 MG EC tablet Take 81 mg by mouth daily. Swallow whole.     chlorhexidine  (PERIDEX ) 0.12 % solution Use as directed 5 mLs in the mouth or throat 2 (two) times daily.     diphenhydrAMINE  (BENADRYL ) 25 MG tablet Take 25 mg by mouth daily as needed for allergies.     dorzolamide -timolol  (COSOPT ) 22.3-6.8 MG/ML ophthalmic solution Place 1 drop into both eyes 2 (two) times daily.      FIBER PO Take 1 capsule by mouth 2 (two) times daily.     fluticasone  (FLONASE ) 50 MCG/ACT nasal spray Place 2 sprays into both nostrils daily. 9.9 mL 2   gabapentin  (NEURONTIN ) 600 MG tablet Take 1 tablet (600 mg total) by mouth in the morning and at bedtime. 180 tablet 1   loratadine  (CLARITIN ) 10 MG tablet Take 1 tablet (10 mg total) by mouth daily. 30 tablet 11   Magnesium  Oxide 420 MG TABS Take 1 tablet by  mouth daily.     meloxicam  (MOBIC ) 15 MG tablet Take 1 tablet (15 mg total) by mouth daily. 30 tablet 5   metroNIDAZOLE  (METROGEL ) 0.75 % gel Apply 1 application. topically 2 (two) times daily. (Patient taking differently: Apply 1 application  topically See admin instructions. Apply to affected areas of the face 2 times a day) 45 g 11   ondansetron  (ZOFRAN ) 4 MG tablet Take 1 tablet (4 mg total) by mouth every 8 (eight) hours as needed for nausea or vomiting. 20 tablet 0   ONE DAILY MULTIPLE VITAMIN PO Take 1 tablet by mouth daily.      rosuvastatin  (CRESTOR ) 20 MG tablet Take 1 tablet (20 mg total) by mouth daily. 90 tablet 3   traZODone  (DESYREL ) 50 MG tablet TAKE 1/2 TO 1 (ONE-HALF TO ONE) TABLET BY MOUTH AT BEDTIME AS  NEEDED FOR SLEEP 90 tablet 3   vitamin C (ASCORBIC ACID ) 500 MG tablet Take 500 mg by mouth daily.     No current facility-administered medications on file prior to visit.   No Known Allergies Social History   Socioeconomic History   Marital status: Married    Spouse name: Not on file   Number of children: Not on file   Years of education: Not on file   Highest education level: Some college, no degree  Occupational History   Not on file  Tobacco Use   Smoking status: Former    Current packs/day: 0.00    Types: Cigarettes    Quit date: 03/09/1981    Years since quitting: 42.8   Smokeless tobacco: Never  Vaping Use   Vaping status: Never Used  Substance and Sexual Activity   Alcohol use: Yes    Alcohol/week: 2.0 standard drinks of alcohol    Types: 2 Cans of beer per week    Comment: occasionally   Drug use: No   Sexual activity: Not Currently  Other Topics Concern   Not on file  Social History Narrative   Not on file   Social Drivers of Health   Financial Resource Strain: Low Risk  (01/18/2024)   Overall Financial Resource Strain (CARDIA)    Difficulty of Paying Living Expenses: Not hard at all  Food Insecurity: No Food Insecurity (01/18/2024)   Hunger Vital Sign    Worried About Running Out of Food in the Last Year: Never true    Ran Out of Food in the Last Year: Never true  Transportation Needs: No Transportation Needs (01/18/2024)   PRAPARE - Administrator, Civil Service (Medical): No    Lack of Transportation (Non-Medical): No  Physical Activity: Insufficiently Active (01/18/2024)   Exercise Vital Sign    Days of Exercise per Week: 3 days    Minutes of Exercise per Session: 40 min  Stress: No Stress Concern Present (04/28/2023)   [employer] of Occupational Health - Occupational Stress Questionnaire    Feeling of Stress : Not at all  Social Connections: Unknown (01/18/2024)   Social Connection and Isolation Panel    Frequency of Communication with  Friends and Family: Once a week    Frequency of Social Gatherings with Friends and Family: Once a week    Attends Religious Services: Not on Insurance claims handler of Clubs or Organizations: Yes    Attends Banker Meetings: More than 4 times per year    Marital Status: Not on file  Intimate Partner Violence: Not At Risk (04/28/2023)   Humiliation,  Afraid, Rape, and Kick questionnaire    Fear of Current or Ex-Partner: No    Emotionally Abused: No    Physically Abused: No    Sexually Abused: No   Family History  Problem Relation Age of Onset   Arthritis Mother    Heart disease Mother    Hyperlipidemia Mother    Hypertension Mother    Stroke Mother    Vision loss Mother    Arthritis Father    Depression Father    Heart disease Father    Hyperlipidemia Father    Stroke Father    Arthritis Sister    Cancer Sister    Diabetes Sister    Hyperlipidemia Sister    Arthritis Brother    Diabetes Brother    Heart disease Brother    Hyperlipidemia Brother    Hypertension Brother    Stomach cancer Maternal Grandfather    Colon cancer Neg Hx    Esophageal cancer Neg Hx    Rectal cancer Neg Hx      Review of Systems  Skin:  Positive for rash.  All other systems reviewed and are negative.      Objective:   Physical Exam Vitals reviewed.  Constitutional:      General: He is not in acute distress.    Appearance: Normal appearance. He is not ill-appearing, toxic-appearing or diaphoretic.  HENT:     Head: Normocephalic and atraumatic.     Right Ear: Tympanic membrane and ear canal normal.     Left Ear: Tympanic membrane and ear canal normal.     Nose: No congestion or rhinorrhea.     Mouth/Throat:     Pharynx: No oropharyngeal exudate or posterior oropharyngeal erythema.  Cardiovascular:     Rate and Rhythm: Regular rhythm. Bradycardia present.     Pulses:          Dorsalis pedis pulses are 0 on the left side.     Heart sounds: Normal heart sounds. No murmur  heard.    No friction rub. No gallop.  Pulmonary:     Effort: Pulmonary effort is normal. No respiratory distress.     Breath sounds: No stridor. No wheezing, rhonchi or rales.  Chest:     Chest wall: No tenderness.  Lymphadenopathy:     Cervical: No cervical adenopathy.  Skin:    General: Skin is warm.     Findings: No rash.  Neurological:     General: No focal deficit present.     Mental Status: He is alert and oriented to person, place, and time. Mental status is at baseline.           Assessment & Plan:  Alpha-gal syndrome - Plan: Alpha-Gal Panel I will repeat the patient's alpha gal panel to determine if his antibody levels are declining.  Hopefully the patient can start resuming small quantities of certain foods if his antibody levels are diminishing.  The patient is bradycardic today on exam but he is asymptomatic.  I do not feel further workup is necessary unless he starts becoming extremely lightheaded and dizzy and weak.

## 2024-01-24 DIAGNOSIS — L814 Other melanin hyperpigmentation: Secondary | ICD-10-CM | POA: Diagnosis not present

## 2024-01-24 DIAGNOSIS — L578 Other skin changes due to chronic exposure to nonionizing radiation: Secondary | ICD-10-CM | POA: Diagnosis not present

## 2024-01-24 DIAGNOSIS — L570 Actinic keratosis: Secondary | ICD-10-CM | POA: Diagnosis not present

## 2024-01-24 DIAGNOSIS — D485 Neoplasm of uncertain behavior of skin: Secondary | ICD-10-CM | POA: Diagnosis not present

## 2024-01-24 DIAGNOSIS — L821 Other seborrheic keratosis: Secondary | ICD-10-CM | POA: Diagnosis not present

## 2024-01-24 DIAGNOSIS — D229 Melanocytic nevi, unspecified: Secondary | ICD-10-CM | POA: Diagnosis not present

## 2024-01-25 LAB — ALPHA-GAL PANEL
Allergen, Mutton, f88: 0.17 kU/L — ABNORMAL HIGH
Allergen, Pork, f26: 0.14 kU/L — ABNORMAL HIGH
Beef: 0.32 kU/L — ABNORMAL HIGH
GALACTOSE-ALPHA-1,3-GALACTOSE IGE*: 0.65 kU/L — ABNORMAL HIGH (ref <0.10)

## 2024-01-25 LAB — INTERPRETATION:

## 2024-01-26 ENCOUNTER — Ambulatory Visit: Payer: Self-pay | Admitting: Family Medicine

## 2024-02-28 DIAGNOSIS — Z23 Encounter for immunization: Secondary | ICD-10-CM | POA: Diagnosis not present

## 2024-03-07 ENCOUNTER — Encounter: Payer: Self-pay | Admitting: Family Medicine

## 2024-03-13 ENCOUNTER — Encounter: Payer: Self-pay | Admitting: Family Medicine

## 2024-04-06 ENCOUNTER — Encounter: Payer: Self-pay | Admitting: Family Medicine

## 2024-04-06 ENCOUNTER — Ambulatory Visit (INDEPENDENT_AMBULATORY_CARE_PROVIDER_SITE_OTHER): Admitting: Family Medicine

## 2024-04-06 VITALS — BP 126/82 | HR 54 | Temp 98.1°F | Ht 68.0 in | Wt 167.2 lb

## 2024-04-06 DIAGNOSIS — L304 Erythema intertrigo: Secondary | ICD-10-CM | POA: Diagnosis not present

## 2024-04-06 MED ORDER — CLOTRIMAZOLE-BETAMETHASONE 1-0.05 % EX CREA: 1.0000 | TOPICAL_CREAM | Freq: Every day | CUTANEOUS | 1 refills | Status: AC

## 2024-04-06 NOTE — Progress Notes (Signed)
 Subjective:    Patient ID: Kyle Wilcox, male    DOB: 1951-04-17, 73 y.o.   MRN: [id number]  Patient reports a red rash in the crease of his thigh between his perineum and his medial thigh.  The rash is elliptical shaped with half on the thigh half on the scrotum.  It is erythematous.  There are satellite macules of erythema surrounding it.  He reports itching.  He has tried some cortisone cream without improvement. Past Medical History:  Diagnosis Date   Allergy 2014   Arthritis    Phreesia 02/16/2020   Cancer (HCC) 2015   prostate cancer   Cataract 2016   COVID-19 05/2019   COVID-19 05/2019   DDD (degenerative disc disease), lumbar 12/24/2014   GERD (gastroesophageal reflux disease)    Glaucoma    Hx of colonic polyps 02/19/2011   Hyperlipidemia 11/07/2014   Peripheral neuropathy 12/24/2014   Past Surgical History:  Procedure Laterality Date   COLONOSCOPY     CRANIOTOMY N/A 10/20/2021   Procedure: Endoscopic transphenoidal resection of pituitary tumor;  Surgeon: Gillie Duncans, MD;  Location: [facility] OR;  Service: Neurosurgery;  Laterality: N/A;   EYE SURGERY     laser surgery to relieve pressure on eyes- both eyes   JOINT REPLACEMENT  2011   total shoulder replacement (Right)   LAPAROTOMY N/A 01/06/2018   Procedure: EXPLORATORY LAPAROTOMY;  Surgeon: Kimble Agent, MD;  Location: [facility] OR;  Service: General;  Laterality: N/A;   LYSIS OF ADHESION N/A 01/06/2018   Procedure: LYSIS OF ADHESION;  Surgeon: Kimble Agent, MD;  Location: [facility] OR;  Service: General;  Laterality: N/A;   PROSTATE SURGERY     2015   SMALL INTESTINE SURGERY N/A    Phreesia 02/16/2020   TONSILLECTOMY     TOTAL SHOULDER ARTHROPLASTY Left 03/04/2020   Procedure: TOTAL SHOULDER ARTHROPLASTY;  Surgeon: Josefina Chew, MD;  Location: [facility] ORS;  Service: Orthopedics;  Laterality: Left;   TRANSPHENOIDAL APPROACH EXPOSURE N/A 10/20/2021   Procedure: TRANSPHENOIDAL APPROACH EXPOSURE;  Surgeon: Mable Lenis, MD;  Location: [facility] OR;   Service: ENT;  Laterality: N/A;   VASECTOMY  1982   Current Outpatient Medications on File Prior to Visit  Medication Sig Dispense Refill   amoxicillin (AMOXIL) 500 MG capsule Take 500 mg by mouth 3 (three) times daily.     aspirin 81 MG EC tablet Take 81 mg by mouth daily. Swallow whole.     chlorhexidine  (PERIDEX ) 0.12 % solution Use as directed 5 mLs in the mouth or throat 2 (two) times daily.     diphenhydrAMINE  (BENADRYL ) 25 MG tablet Take 25 mg by mouth daily as needed for allergies.     dorzolamide -timolol  (COSOPT ) 22.3-6.8 MG/ML ophthalmic solution Place 1 drop into both eyes 2 (two) times daily.      FIBER PO Take 1 capsule by mouth 2 (two) times daily.     fluticasone  (FLONASE ) 50 MCG/ACT nasal spray Place 2 sprays into both nostrils daily. 9.9 mL 2   gabapentin  (NEURONTIN ) 600 MG tablet Take 1 tablet (600 mg total) by mouth in the morning and at bedtime. 180 tablet 1   loratadine  (CLARITIN ) 10 MG tablet Take 1 tablet (10 mg total) by mouth daily. 30 tablet 11   Magnesium  Oxide 420 MG TABS Take 1 tablet by mouth daily.     meloxicam  (MOBIC ) 15 MG tablet Take 1 tablet (15 mg total) by mouth daily. 30 tablet 5   metroNIDAZOLE  (METROGEL ) 0.75 % gel Apply 1  application. topically 2 (two) times daily. (Patient taking differently: Apply 1 application  topically See admin instructions. Apply to affected areas of the face 2 times a day) 45 g 11   ondansetron  (ZOFRAN ) 4 MG tablet Take 1 tablet (4 mg total) by mouth every 8 (eight) hours as needed for nausea or vomiting. 20 tablet 0   ONE DAILY MULTIPLE VITAMIN PO Take 1 tablet by mouth daily.      rosuvastatin  (CRESTOR ) 20 MG tablet Take 1 tablet (20 mg total) by mouth daily. 90 tablet 3   traZODone  (DESYREL ) 50 MG tablet TAKE 1/2 TO 1 (ONE-HALF TO ONE) TABLET BY MOUTH AT BEDTIME AS NEEDED FOR SLEEP 90 tablet 3   vitamin C (ASCORBIC ACID ) 500 MG tablet Take 500 mg by mouth daily.     No current facility-administered medications on file prior  to visit.   No Known Allergies Social History   Socioeconomic History   Marital status: Married    Spouse name: Not on file   Number of children: Not on file   Years of education: Not on file   Highest education level: Some college, no degree  Occupational History   Not on file  Tobacco Use   Smoking status: Former    Current packs/day: 0.00    Types: Cigarettes    Quit date: 03/09/1981    Years since quitting: 43.1   Smokeless tobacco: Never  Vaping Use   Vaping status: Never Used  Substance and Sexual Activity   Alcohol use: Yes    Alcohol/week: 2.0 standard drinks of alcohol    Types: 2 Cans of beer per week    Comment: occasionally   Drug use: No   Sexual activity: Not Currently  Other Topics Concern   Not on file  Social History Narrative   Not on file   Social Drivers of Health   Financial Resource Strain: Low Risk  (01/18/2024)   Overall Financial Resource Strain (CARDIA)    Difficulty of Paying Living Expenses: Not hard at all  Food Insecurity: No Food Insecurity (04/05/2024)   Hunger Vital Sign    Worried About Running Out of Food in the Last Year: Never true    Ran Out of Food in the Last Year: Never true  Transportation Needs: No Transportation Needs (04/05/2024)   PRAPARE - Administrator, Civil Service (Medical): No    Lack of Transportation (Non-Medical): No  Physical Activity: Insufficiently Active (04/05/2024)   Exercise Vital Sign    Days of Exercise per Week: 2 days    Minutes of Exercise per Session: 40 min  Stress: No Stress Concern Present (04/28/2023)   [employer] of Occupational Health - Occupational Stress Questionnaire    Feeling of Stress : Not at all  Social Connections: Unknown (04/05/2024)   Social Connection and Isolation Panel    Frequency of Communication with Friends and Family: Twice a week    Frequency of Social Gatherings with Friends and Family: Once a week    Attends Religious Services: 1 to 4 times per  year    Active Member of [employer] or Organizations: Yes    Attends Banker Meetings: More than 4 times per year    Marital Status: Not on file  Intimate Partner Violence: Not At Risk (04/28/2023)   Humiliation, Afraid, Rape, and Kick questionnaire    Fear of Current or Ex-Partner: No    Emotionally Abused: No    Physically Abused: No  Sexually Abused: No   Family History  Problem Relation Age of Onset   Arthritis Mother    Heart disease Mother    Hyperlipidemia Mother    Hypertension Mother    Stroke Mother    Vision loss Mother    Arthritis Father    Depression Father    Heart disease Father    Hyperlipidemia Father    Stroke Father    Arthritis Sister    Cancer Sister    Diabetes Sister    Hyperlipidemia Sister    Arthritis Brother    Diabetes Brother    Heart disease Brother    Hyperlipidemia Brother    Hypertension Brother    Stomach cancer Maternal Grandfather    Colon cancer Neg Hx    Esophageal cancer Neg Hx    Rectal cancer Neg Hx      Review of Systems  Skin:  Positive for rash.  All other systems reviewed and are negative.      Objective:   Physical Exam Vitals reviewed.  Constitutional:      General: He is not in acute distress.    Appearance: Normal appearance. He is not ill-appearing, toxic-appearing or diaphoretic.  HENT:     Head: Normocephalic and atraumatic.  Cardiovascular:     Rate and Rhythm: Normal rate and regular rhythm.     Pulses:          Dorsalis pedis pulses are 0 on the left side.     Heart sounds: Normal heart sounds. No murmur heard.    No friction rub. No gallop.  Pulmonary:     Effort: Pulmonary effort is normal. No respiratory distress.     Breath sounds: No stridor. No wheezing, rhonchi or rales.  Chest:     Chest wall: No tenderness.  Genitourinary:   Musculoskeletal:       Legs:  Lymphadenopathy:     Cervical: No cervical adenopathy.  Skin:    General: Skin is warm.     Findings: Rash present.   Neurological:     General: No focal deficit present.     Mental Status: He is alert and oriented to person, place, and time. Mental status is at baseline.           Assessment & Plan:  Intertrigo Patient has intertrigo.  Begin Lotrisone  cream twice daily for 10 to 14 days.  We discussed the things that cause this and how to avoid it by keeping the area clean and dry in the future

## 2024-05-01 ENCOUNTER — Encounter: Payer: Self-pay | Admitting: Family Medicine

## 2024-05-03 ENCOUNTER — Ambulatory Visit

## 2024-05-03 DIAGNOSIS — Z0000 Encounter for general adult medical examination without abnormal findings: Secondary | ICD-10-CM | POA: Diagnosis not present

## 2024-05-03 NOTE — Progress Notes (Signed)
 Chief Complaint  Patient presents with   Medicare Wellness     Subjective:   Kyle Wilcox is a 73 y.o. male who presents for a Medicare Annual Wellness Visit.  Visit info / Clinical Intake: Persons participating in visit and providing information:: patient Medicare Wellness Visit Mode:: Telephone If telephone:: video declined Since this visit was completed virtually, some vitals may be partially provided or unavailable. Missing vitals are due to the limitations of the virtual format.: Unable to obtain vitals - no equipment If Telephone or Video please confirm:: I connected with patient using audio/video enable telemedicine. I verified patient identity with two identifiers, discussed telehealth limitations, and patient agreed to proceed. Patient Location:: home Provider Location:: home Interpreter Needed?: No Pre-visit prep was completed: no AWV questionnaire completed by patient prior to visit?: yes Date:: 04/06/24 Living arrangements:: lives with spouse/significant other Patient's Overall Health Status Rating: good Typical amount of pain: none Does pain affect daily life?: no Are you currently prescribed opioids?: no  Dietary Habits and Nutritional Risks How many meals a day?: 2 Eats fruit and vegetables daily?: yes Most meals are obtained by: preparing own meals In the last 2 weeks, have you had any of the following?: none Diabetic:: no  Functional Status Activities of Daily Living (to include ambulation/medication): Independent Ambulation: Independent Medication Administration: Independent Home Management (perform basic housework or laundry): Independent Manage your own finances?: (!) no Primary transportation is: driving Concerns about vision?: no *vision screening is required for WTM* Concerns about hearing?: no  Fall Screening Falls in the past year?: 0 Number of falls in past year: 0 Was there an injury with Fall?: 0 Fall Risk Category Calculator:  0 Patient Fall Risk Level: Low Fall Risk  Fall Risk Patient at Risk for Falls Due to: No Fall Risks Fall risk Follow up: Falls evaluation completed; Education provided; Falls prevention discussed  Home and Transportation Safety: All rugs have non-skid backing?: yes All stairs or steps have railings?: N/A, no stairs Grab bars in the bathtub or shower?: yes Have non-skid surface in bathtub or shower?: yes Good home lighting?: yes Regular seat belt use?: yes Hospital stays in the last year:: no  Cognitive Assessment Difficulty concentrating, remembering, or making decisions? : no Will 6CIT or Mini Cog be Completed: yes What year is it?: 0 points What month is it?: 0 points Give patient an address phrase to remember (5 components): Its very sunny outside today in December About what time is it?: 0 points Count backwards from 20 to 1: 0 points Say the months of the year in reverse: 0 points Repeat the address phrase from earlier: 0 points 6 CIT Score: 0 points  Advance Directives (For Healthcare) Does Patient Have a Medical Advance Directive?: Yes Type of Advance Directive: Healthcare Power of Attorney Copy of Healthcare Power of Attorney in Chart?: No - copy requested  Reviewed/Updated  Reviewed/Updated: Reviewed All (Medical, Surgical, Family, Medications, Allergies, Care Teams, Patient Goals); Surgical History; Family History; Medications; Allergies; Care Teams; Patient Goals; Medical History    Allergies (verified) Patient has no known allergies.   Current Medications (verified) Outpatient Encounter Medications as of 05/03/2024  Medication Sig   aspirin 81 MG EC tablet Take 81 mg by mouth daily. Swallow whole.   chlorhexidine  (PERIDEX ) 0.12 % solution Use as directed 5 mLs in the mouth or throat 2 (two) times daily.   clotrimazole -betamethasone  (LOTRISONE ) cream Apply 1 Application topically daily.   dorzolamide -timolol  (COSOPT ) 22.3-6.8 MG/ML ophthalmic solution Place 1  drop into both eyes 2 (two) times daily.    FIBER PO Take 1 capsule by mouth 2 (two) times daily.   fluticasone  (FLONASE ) 50 MCG/ACT nasal spray Place 2 sprays into both nostrils daily.   gabapentin  (NEURONTIN ) 600 MG tablet Take 1 tablet (600 mg total) by mouth in the morning and at bedtime.   loratadine  (CLARITIN ) 10 MG tablet Take 1 tablet (10 mg total) by mouth daily.   Magnesium  Oxide 420 MG TABS Take 1 tablet by mouth daily.   meloxicam  (MOBIC ) 15 MG tablet Take 1 tablet (15 mg total) by mouth daily.   metroNIDAZOLE  (METROGEL ) 0.75 % gel Apply 1 application. topically 2 (two) times daily. (Patient taking differently: Apply 1 application  topically See admin instructions. Apply to affected areas of the face 2 times a day)   ondansetron  (ZOFRAN ) 4 MG tablet Take 1 tablet (4 mg total) by mouth every 8 (eight) hours as needed for nausea or vomiting.   ONE DAILY MULTIPLE VITAMIN PO Take 1 tablet by mouth daily.    rosuvastatin  (CRESTOR ) 20 MG tablet Take 1 tablet (20 mg total) by mouth daily.   traZODone  (DESYREL ) 50 MG tablet TAKE 1/2 TO 1 (ONE-HALF TO ONE) TABLET BY MOUTH AT BEDTIME AS NEEDED FOR SLEEP   vitamin C (ASCORBIC ACID ) 500 MG tablet Take 500 mg by mouth daily.   amoxicillin (AMOXIL) 500 MG capsule Take 500 mg by mouth 3 (three) times daily. (Patient not taking: Reported on 05/03/2024)   diphenhydrAMINE  (BENADRYL ) 25 MG tablet Take 25 mg by mouth daily as needed for allergies.   No facility-administered encounter medications on file as of 05/03/2024.    History: Past Medical History:  Diagnosis Date   Allergy 2014   Arthritis    Phreesia 02/16/2020   Cancer (HCC) 2015   prostate cancer   Cataract 2016   COVID-19 05/2019   COVID-19 05/2019   DDD (degenerative disc disease), lumbar 12/24/2014   GERD (gastroesophageal reflux disease)    Glaucoma    Hx of colonic polyps 02/19/2011   Hyperlipidemia 11/07/2014   Peripheral neuropathy 12/24/2014   Past Surgical History:   Procedure Laterality Date   COLONOSCOPY     CRANIOTOMY N/A 10/20/2021   Procedure: Endoscopic transphenoidal resection of pituitary tumor;  Surgeon: Gillie Duncans, MD;  Location: [facility] OR;  Service: Neurosurgery;  Laterality: N/A;   EYE SURGERY     laser surgery to relieve pressure on eyes- both eyes   JOINT REPLACEMENT  2011   total shoulder replacement (Right)   LAPAROTOMY N/A 01/06/2018   Procedure: EXPLORATORY LAPAROTOMY;  Surgeon: Kimble Agent, MD;  Location: [facility] OR;  Service: General;  Laterality: N/A;   LYSIS OF ADHESION N/A 01/06/2018   Procedure: LYSIS OF ADHESION;  Surgeon: Kimble Agent, MD;  Location: [facility] OR;  Service: General;  Laterality: N/A;   PROSTATE SURGERY     2015   SMALL INTESTINE SURGERY N/A    Phreesia 02/16/2020   TONSILLECTOMY     TOTAL SHOULDER ARTHROPLASTY Left 03/04/2020   Procedure: TOTAL SHOULDER ARTHROPLASTY;  Surgeon: Josefina Chew, MD;  Location: [facility] ORS;  Service: Orthopedics;  Laterality: Left;   TRANSPHENOIDAL APPROACH EXPOSURE N/A 10/20/2021   Procedure: TRANSPHENOIDAL APPROACH EXPOSURE;  Surgeon: Mable Lenis, MD;  Location: [facility] OR;  Service: ENT;  Laterality: N/A;   VASECTOMY  1982   Family History  Problem Relation Age of Onset   Arthritis Mother    Heart disease Mother    Hyperlipidemia Mother  Hypertension Mother    Stroke Mother    Vision loss Mother    Arthritis Father    Depression Father    Heart disease Father    Hyperlipidemia Father    Stroke Father    Arthritis Sister    Cancer Sister    Diabetes Sister    Hyperlipidemia Sister    Arthritis Brother    Diabetes Brother    Heart disease Brother    Hyperlipidemia Brother    Hypertension Brother    Stomach cancer Maternal Grandfather    Colon cancer Neg Hx    Esophageal cancer Neg Hx    Rectal cancer Neg Hx    Social History   Occupational History   Not on file  Tobacco Use   Smoking status: Former    Current packs/day: 0.00    Types: Cigarettes    Quit date:  03/09/1981    Years since quitting: 43.1   Smokeless tobacco: Never  Vaping Use   Vaping status: Never Used  Substance and Sexual Activity   Alcohol use: Yes    Alcohol/week: 2.0 standard drinks of alcohol    Types: 2 Cans of beer per week    Comment: occasionally   Drug use: No   Sexual activity: Not Currently   Tobacco Counseling Counseling given: Not Answered  SDOH Screenings   Food Insecurity: No Food Insecurity (05/03/2024)  Housing: Low Risk (05/03/2024)  Transportation Needs: No Transportation Needs (05/03/2024)  Utilities: Not At Risk (05/03/2024)  Alcohol Screen: Low Risk (04/05/2024)  Depression (PHQ2-9): Low Risk (05/03/2024)  Financial Resource Strain: Low Risk (01/18/2024)  Physical Activity: Insufficiently Active (05/03/2024)  Social Connections: Unknown (05/03/2024)  Stress: No Stress Concern Present (05/03/2024)  Tobacco Use: Medium Risk (04/06/2024)  Health Literacy: Adequate Health Literacy (05/03/2024)   See flowsheets for full screening details  Depression Screen PHQ 2 & 9 Depression Scale- Over the past 2 weeks, how often have you been bothered by any of the following problems? Little interest or pleasure in doing things: 0 Feeling down, depressed, or hopeless (PHQ Adolescent also includes...irritable): 0 PHQ-2 Total Score: 0 Trouble falling or staying asleep, or sleeping too much: 2 Feeling tired or having little energy: 0 Poor appetite or overeating (PHQ Adolescent also includes...weight loss): 0 Feeling bad about yourself - or that you are a failure or have let yourself or your family down: 0 Trouble concentrating on things, such as reading the newspaper or watching television (PHQ Adolescent also includes...like school work): 0 Moving or speaking so slowly that other people could have noticed. Or the opposite - being so fidgety or restless that you have been moving around a lot more than usual: 0 Thoughts that you would be better off dead, or of  hurting yourself in some way: 0 PHQ-9 Total Score: 2 If you checked off any problems, how difficult have these problems made it for you to do your work, take care of things at home, or get along with other people?: Not difficult at all  Depression Treatment Depression Interventions/Treatment : EYV7-0 Score <4 Follow-up Not Indicated     Goals Addressed             This Visit's Progress    Patient Stated   On track    Maintain current health      Patient Stated       Maintain current health             Objective:    There were no vitals  filed for this visit. There is no height or weight on file to calculate BMI.  Hearing/Vision screen Hearing Screening - Comments:: No trouble hearing Immunizations and Health Maintenance Health Maintenance  Topic Date Due   COVID-19 Vaccine (12 - Pfizer risk 2025-26 season) 08/28/2024   DTaP/Tdap/Td (2 - Td or Tdap) 03/10/2025   Medicare Annual Wellness (AWV)  05/03/2025   Colonoscopy  06/01/2026   Pneumococcal Vaccine: 50+ Years  Completed   Influenza Vaccine  Completed   Hepatitis C Screening  Completed   Zoster Vaccines- Shingrix  Completed   Meningococcal B Vaccine  Aged Out        Assessment/Plan:  This is a routine wellness examination for Kyle Wilcox.  Patient Care Team: Duanne Butler DASEN, MD as PCP - General (Family Medicine)  I have personally reviewed and noted the following in the patients chart:   Medical and social history Use of alcohol, tobacco or illicit drugs  Current medications and supplements including opioid prescriptions. Functional ability and status Nutritional status Physical activity Advanced directives List of other physicians Hospitalizations, surgeries, and ER visits in previous 12 months Vitals Screenings to include cognitive, depression, and falls Referrals and appointments  No orders of the defined types were placed in this encounter.  In addition, I have reviewed and discussed with  patient certain preventive protocols, quality metrics, and best practice recommendations. A written personalized care plan for preventive services as well as general preventive health recommendations were provided to patient.   Mliss Graff, LPN   [id number]   No follow-ups on file.  After Visit Summary: (Mail) Due to this being a telephonic visit, the after visit summary with patients personalized plan was offered to patient via mail   Nurse Notes:

## 2024-05-03 NOTE — Patient Instructions (Signed)
 [name],  Thank you for taking the time for your Medicare Wellness Visit. I appreciate your continued commitment to your health goals. Please review the care plan we discussed, and feel free to reach out if I can assist you further.  Please note that Annual Wellness Visits do not include a physical exam. Some assessments may be limited, especially if the visit was conducted virtually. If needed, we may recommend an in-person follow-up with your provider.  Ongoing Care Seeing your primary care provider every 3 to 6 months helps us  monitor your health and provide consistent, personalized care.   Referrals If a referral was made during today's visit and you haven't received any updates within two weeks, please contact the referred provider directly to check on the status.  Recommended Screenings:  Health Maintenance  Topic Date Due   COVID-19 Vaccine (12 - Pfizer risk 2025-26 season) 08/28/2024   DTaP/Tdap/Td vaccine (2 - Td or Tdap) 03/10/2025   Medicare Annual Wellness Visit  05/03/2025   Colon Cancer Screening  06/01/2026   Pneumococcal Vaccine for age over 30  Completed   Flu Shot  Completed   Hepatitis C Screening  Completed   Zoster (Shingles) Vaccine  Completed   Meningitis B Vaccine  Aged Out       05/03/2024   12:14 PM  Advanced Directives  Does Patient Have a Medical Advance Directive? Yes  Type of Advance Directive Healthcare Power of Attorney  Copy of Healthcare Power of Attorney in Chart? No - copy requested    Vision: Annual vision screenings are recommended for early detection of glaucoma, cataracts, and diabetic retinopathy. These exams can also reveal signs of chronic conditions such as diabetes and high blood pressure.  Dental: Annual dental screenings help detect early signs of oral cancer, gum disease, and other conditions linked to overall health, including heart disease and diabetes.  Please see the attached documents for additional preventive care  recommendations.     [name] , Thank you for taking time to come for your Medicare Wellness Visit. I appreciate your ongoing commitment to your health goals. Please review the following plan we discussed and let me know if I can assist you in the future.   Screening recommendations/referrals: Colonoscopy:  Recommended yearly ophthalmology/optometry visit for glaucoma screening and checkup Recommended yearly dental visit for hygiene and checkup  Vaccinations: Influenza vaccine:  Pneumococcal vaccine: Tdap vaccine:  Shingles vaccine:       Preventive Care 65 Years and Older, Male Preventive care refers to lifestyle choices and visits with your health care provider that can promote health and wellness. What does preventive care include? A yearly physical exam. This is also called an annual well check. Dental exams once or twice a year. Routine eye exams. Ask your health care provider how often you should have your eyes checked. Personal lifestyle choices, including: Daily care of your teeth and gums. Regular physical activity. Eating a healthy diet. Avoiding tobacco and drug use. Limiting alcohol use. Practicing safe sex. Taking low doses of aspirin every day. Taking vitamin and mineral supplements as recommended by your health care provider. What happens during an annual well check? The services and screenings done by your health care provider during your annual well check will depend on your age, overall health, lifestyle risk factors, and family history of disease. Counseling  Your health care provider may ask you questions about your: Alcohol use. Tobacco use. Drug use. Emotional well-being. Home and relationship well-being. Sexual activity. Eating habits.  History of falls. Memory and ability to understand (cognition). Work and work astronomer. Screening  You may have the following tests or measurements: Height, weight, and BMI. Blood pressure. Lipid and  cholesterol levels. These may be checked every 5 years, or more frequently if you are over 20 years old. Skin check. Lung cancer screening. You may have this screening every year starting at age 90 if you have a 30-pack-year history of smoking and currently smoke or have quit within the past 15 years. Fecal occult blood test (FOBT) of the stool. You may have this test every year starting at age 7. Flexible sigmoidoscopy or colonoscopy. You may have a sigmoidoscopy every 5 years or a colonoscopy every 10 years starting at age 50. Prostate cancer screening. Recommendations will vary depending on your family history and other risks. Hepatitis C blood test. Hepatitis B blood test. Sexually transmitted disease (STD) testing. Diabetes screening. This is done by checking your blood sugar (glucose) after you have not eaten for a while (fasting). You may have this done every 1-3 years. Abdominal aortic aneurysm (AAA) screening. You may need this if you are a current or former smoker. Osteoporosis. You may be screened starting at age 74 if you are at high risk. Talk with your health care provider about your test results, treatment options, and if necessary, the need for more tests. Vaccines  Your health care provider may recommend certain vaccines, such as: Influenza vaccine. This is recommended every year. Tetanus, diphtheria, and acellular pertussis (Tdap, Td) vaccine. You may need a Td booster every 10 years. Zoster vaccine. You may need this after age 67. Pneumococcal 13-valent conjugate (PCV13) vaccine. One dose is recommended after age 57. Pneumococcal polysaccharide (PPSV23) vaccine. One dose is recommended after age 67. Talk to your health care provider about which screenings and vaccines you need and how often you need them. This information is not intended to replace advice given to you by your health care provider. Make sure you discuss any questions you have with your health care  provider. Document Released: 05/30/2015 Document Revised: 01/21/2016 Document Reviewed: 03/04/2015 Elsevier Interactive Patient Education  2017 [employer].  Fall Prevention in the Home Falls can cause injuries. They can happen to people of all ages. There are many things you can do to make your home safe and to help prevent falls. What can I do on the outside of my home? Regularly fix the edges of walkways and driveways and fix any cracks. Remove anything that might make you trip as you walk through a door, such as a raised step or threshold. Trim any bushes or trees on the path to your home. Use bright outdoor lighting. Clear any walking paths of anything that might make someone trip, such as rocks or tools. Regularly check to see if handrails are loose or broken. Make sure that both sides of any steps have handrails. Any raised decks and porches should have guardrails on the edges. Have any leaves, snow, or ice cleared regularly. Use sand or salt on walking paths during winter. Clean up any spills in your garage right away. This includes oil or grease spills. What can I do in the bathroom? Use night lights. Install grab bars by the toilet and in the tub and shower. Do not use towel bars as grab bars. Use non-skid mats or decals in the tub or shower. If you need to sit down in the shower, use a plastic, non-slip stool. Keep the floor dry. Clean up any  water  that spills on the floor as soon as it happens. Remove soap buildup in the tub or shower regularly. Attach bath mats securely with double-sided non-slip rug tape. Do not have throw rugs and other things on the floor that can make you trip. What can I do in the bedroom? Use night lights. Make sure that you have a light by your bed that is easy to reach. Do not use any sheets or blankets that are too big for your bed. They should not hang down onto the floor. Have a firm chair that has side arms. You can use this for support while  you get dressed. Do not have throw rugs and other things on the floor that can make you trip. What can I do in the kitchen? Clean up any spills right away. Avoid walking on wet floors. Keep items that you use a lot in easy-to-reach places. If you need to reach something above you, use a strong step stool that has a grab bar. Keep electrical cords out of the way. Do not use floor polish or wax that makes floors slippery. If you must use wax, use non-skid floor wax. Do not have throw rugs and other things on the floor that can make you trip. What can I do with my stairs? Do not leave any items on the stairs. Make sure that there are handrails on both sides of the stairs and use them. Fix handrails that are broken or loose. Make sure that handrails are as long as the stairways. Check any carpeting to make sure that it is firmly attached to the stairs. Fix any carpet that is loose or worn. Avoid having throw rugs at the top or bottom of the stairs. If you do have throw rugs, attach them to the floor with carpet tape. Make sure that you have a light switch at the top of the stairs and the bottom of the stairs. If you do not have them, ask someone to add them for you. What else can I do to help prevent falls? Wear shoes that: Do not have high heels. Have rubber bottoms. Are comfortable and fit you well. Are closed at the toe. Do not wear sandals. If you use a stepladder: Make sure that it is fully opened. Do not climb a closed stepladder. Make sure that both sides of the stepladder are locked into place. Ask someone to hold it for you, if possible. Clearly mark and make sure that you can see: Any grab bars or handrails. First and last steps. Where the edge of each step is. Use tools that help you move around (mobility aids) if they are needed. These include: Canes. Walkers. Scooters. Crutches. Turn on the lights when you go into a dark area. Replace any light bulbs as soon as they burn  out. Set up your furniture so you have a clear path. Avoid moving your furniture around. If any of your floors are uneven, fix them. If there are any pets around you, be aware of where they are. Review your medicines with your doctor. Some medicines can make you feel dizzy. This can increase your chance of falling. Ask your doctor what other things that you can do to help prevent falls. This information is not intended to replace advice given to you by your health care provider. Make sure you discuss any questions you have with your health care provider. Document Released: 02/27/2009 Document Revised: 10/09/2015 Document Reviewed: 06/07/2014 Elsevier Interactive Patient Education  2017 Elsevier  Inc[employer]

## 2024-05-25 ENCOUNTER — Ambulatory Visit: Admitting: Podiatry

## 2024-05-25 DIAGNOSIS — L600 Ingrowing nail: Secondary | ICD-10-CM

## 2024-05-25 DIAGNOSIS — Z79899 Other long term (current) drug therapy: Secondary | ICD-10-CM | POA: Diagnosis not present

## 2024-05-25 DIAGNOSIS — B351 Tinea unguium: Secondary | ICD-10-CM | POA: Diagnosis not present

## 2024-05-25 MED ORDER — CEPHALEXIN 500 MG PO CAPS: 500.0000 mg | ORAL_CAPSULE | Freq: Three times a day (TID) | ORAL | 0 refills | Status: DC

## 2024-05-25 NOTE — Patient Instructions (Addendum)
 Soak Instructions    THE DAY AFTER THE PROCEDURE  Place 1/4 cup of epsom salts in a quart of warm tap water.  Submerge your foot or feet with outer bandage intact for the initial soak; this will allow the bandage to become moist and wet for easy lift off.  Once you remove your bandage, continue to soak in the solution for 20 minutes.  This soak should be done twice a day.  Next, remove your foot or feet from solution, blot dry the affected area and cover.  You may use a band aid large enough to cover the area or use gauze and tape.  Apply other medications to the area as directed by the doctor such as polysporin neosporin.  IF YOUR SKIN BECOMES IRRITATED WHILE USING THESE INSTRUCTIONS, IT IS OKAY TO SWITCH TO  WHITE VINEGAR AND WATER. Or you may use antibacterial soap and water to keep the toe clean  Monitor for any signs/symptoms of infection. Call the office immediately if any occur or go directly to the emergency room. Call with any questions/concerns.  --  Terbinafine Tablets What is this medication? TERBINAFINE (TER bin a feen) treats fungal infections of the nails. It belongs to a group of medications called antifungals. It will not treat infections caused by bacteria or viruses. This medicine may be used for other purposes; ask your health care provider or pharmacist if you have questions. COMMON BRAND NAME(S): Lamisil, Terbinex What should I tell my care team before I take this medication? They need to know if you have any of these conditions: Liver disease An unusual or allergic reaction to terbinafine, other medications, foods, dyes, or preservatives Pregnant or trying to get pregnant Breast-feeding How should I use this medication? Take this medication by mouth with water. Take it as directed on the prescription label at the same time every day. You can take it with or without food. If it upsets your stomach, take it with food. Keep taking it unless your care team tells you to  stop. A special MedGuide will be given to you by the pharmacist with each prescription and refill. Be sure to read this information carefully each time. Talk to your care team about the use of this medication in children. Special care may be needed. Overdosage: If you think you have taken too much of this medicine contact a poison control center or emergency room at once. NOTE: This medicine is only for you. Do not share this medicine with others. What if I miss a dose? If you miss a dose, take it as soon as you can unless it is more than 4 hours late. If it is more than 4 hours late, skip the missed dose. Take the next dose at the normal time. What may interact with this medication? Do not take this medication with any of the following: Pimozide Thioridazine This medication may also interact with the following: Beta blockers Caffeine Certain medications for mental health conditions Cimetidine Cyclosporine Medications for fungal infections, such as fluconazole or ketoconazole Medications for irregular heartbeat, such as amiodarone, flecainide, propafenone Rifampin Warfarin This list may not describe all possible interactions. Give your health care provider a list of all the medicines, herbs, non-prescription drugs, or dietary supplements you use. Also tell them if you smoke, drink alcohol, or use illegal drugs. Some items may interact with your medicine. What should I watch for while using this medication? Visit your care team for regular checks on your progress. Tell your care team  if your symptoms do not start to get better or if they get worse. This medication may cause serious skin reactions. They can happen weeks to months after starting the medication. Contact your care team right away if you notice fevers or flu-like symptoms with a rash. The rash may be red or purple and then turn into blisters or peeling of the skin. You may also notice a red rash with swelling of the face, lips, or  lymph nodes in your neck or under your arms. This medication can make you more sensitive to the sun. Keep out of the sun. If you cannot avoid being in the sun, wear protective clothing and sunscreen. Do not use sun lamps, tanning beds, or tanning booths. What side effects may I notice from receiving this medication? Side effects that you should report to your care team as soon as possible: Allergic reactions--skin rash, itching, hives, swelling of the face, lips, tongue, or throat Change in sense of smell Change in taste Infection--fever, chills, cough, or sore throat Liver injury--right upper belly pain, loss of appetite, nausea, light-colored stool, dark yellow or brown urine, yellowing skin or eyes, unusual weakness or fatigue Low red blood cell level--unusual weakness or fatigue, dizziness, headache, trouble breathing Lupus-like syndrome--joint pain, swelling, or stiffness, butterfly-shaped rash on the face, rashes that get worse in the sun, fever, unusual weakness or fatigue Rash, fever, and swollen lymph nodes Redness, blistering, peeling, or loosening of the skin, including inside the mouth Unusual bruising or bleeding Worsening mood, feelings of depression Side effects that usually do not require medical attention (report to your care team if they continue or are bothersome): Diarrhea Gas Headache Nausea Stomach pain Upset stomach This list may not describe all possible side effects. Call your doctor for medical advice about side effects. You may report side effects to FDA at 1-800-FDA-1088. Where should I keep my medication? Keep out of the reach of children and pets. Store between 20 and 25 degrees C (68 and 77 degrees F). Protect from light. Get rid of any unused medication after the expiration date. To get rid of medications that are no longer needed or have expired: Take the medication to a medication take-back program. Check with your pharmacy or law enforcement to find a  location. If you cannot return the medication, check the label or package insert to see if the medication should be thrown out in the garbage or flushed down the toilet. If you are not sure, ask your care team. If it is safe to put it in the trash, take the medication out of the container. Mix the medication with cat litter, dirt, coffee grounds, or other unwanted substance. Seal the mixture in a bag or container. Put it in the trash. NOTE: This sheet is a summary. It may not cover all possible information. If you have questions about this medicine, talk to your doctor, pharmacist, or health care provider.  2024 Elsevier/Gold Standard (2022-11-19 00:00:00)

## 2024-05-25 NOTE — Progress Notes (Signed)
 "  Subjective:  Patient ID: Kyle Wilcox, male    DOB: [birth year],  MRN: [id number]  Chief Complaint  Patient presents with   Ingrown Toenail    Patient presents today for possible ingrown toenail of the left 3rd toe patient has mild redness and reports area is tender to touch . Patient reports it has been a few months , patient relates he tried peroxide for relief with no change.    Discussed the use of AI scribe software for clinical note transcription with the patient, who gave verbal consent to proceed.  History of Present Illness ANDERSON MIDDLEBROOKS is a 74 year old male who presents for evaluation and management of a chronic, recurrent left ingrown toenail with localized tenderness.  He has intermittent localized tenderness at the left great toenail, worsened by palpation and when putting on socks, without pain during walking. He cannot adequately see or reach the area for self-care.  There is no drainage or exudate. He has tried topical peroxide, cortisone, Neosporin, and mechanical relief with a needle without improvement. He has not had prior professional procedures for this toenail.  He reports he is previously on oral Lamisil  and tolerated well.  He is asking for refill of this medication as his nails are still thick and discolored again.  No pain to the other toenails.  He takes aspirin only for anticoagulation, is not diabetic, and has no medication allergies.      Objective:  There were no vitals filed for this visit.  Physical Exam General: AAO x3, NAD  Dermatological: Incurvation of the left lateral 3rd toenail with localized edema and erythema without any ascending cellulitis.  In general the nails are mildly hypertrophic, dystrophic with yellow discoloration and subungual debris is present.  There is no edema, erythema to the other toenails.  No open lesions.  Vascular: Dorsalis Pedis artery and Posterior Tibial artery pedal pulses are 2/4 bilateral with immedate  capillary fill time. There is no pain with calf compression, swelling, warmth, erythema.   Neruologic: Grossly intact via light touch bilateral.   Musculoskeletal: No other areas of discomfort.  Hammertoe contractures present.     No images are attached to the encounter.    Results    Assessment:   1. Ingrown toenail      Plan:  Patient was evaluated and treated and all questions answered.  Assessment and Plan Assessment & Plan Ingrown toenail Chronic, recurrent ingrown toenail of the left hallux with persistent inflammation and pain refractory to conservative management. - At this time, the patient is requesting partial nail removal with chemical matricectomy to the symptomatic portion of the nail. Risks and complications were discussed with the patient for which they understand and written consent was obtained. Under sterile conditions a total of 3 mL of a mixture of 2% lidocaine  plain and 0.5% Marcaine  plain was infiltrated in a digital block fashion. Once anesthetized, the skin was prepped in sterile fashion. A tourniquet was then applied. Next the lateral aspect of the 3rd toenail nail border was then sharply excised making sure to remove the entire offending nail border. Once the nails were ensured to be removed area was debrided and the underlying skin was intact. There is no purulence identified in the procedure. Next phenol was then applied under standard conditions and copiously irrigated. Silvadene was applied. A dry sterile dressing was applied. After application of the dressing the tourniquet was removed and there is found to be an immediate capillary refill time to  the digit. The patient tolerated the procedure well any complications. Post procedure instructions were discussed the patient for which he verbally understood. Discussed signs/symptoms of infection and directed to call the office immediately should any occur or go directly to the emergency room. In the meantime,  encouraged to call the office with any questions, concerns, changes symptoms. -Keflex   Onychomycosis - Discussed treatment options including oral medication including side effects, success rates and small risk of hepatotoxicity.  He is well aware and he wishes to proceed with oral medication.  Will check CBC, LFT prior to starting oral medication.  Return in about 2 weeks (around 06/08/2024), or if symptoms worsen or fail to improve, for nail check .   Donnice JONELLE Fees DPM  "

## 2024-05-26 LAB — CBC WITH DIFFERENTIAL/PLATELET
Basophils Absolute: 0 x10E3/uL (ref 0.0–0.2)
Basos: 1 %
EOS (ABSOLUTE): 0.2 x10E3/uL (ref 0.0–0.4)
Eos: 4 %
Hematocrit: 42.8 % (ref 37.5–51.0)
Hemoglobin: 14.2 g/dL (ref 13.0–17.7)
Immature Grans (Abs): 0 x10E3/uL (ref 0.0–0.1)
Immature Granulocytes: 0 %
Lymphocytes Absolute: 1.7 x10E3/uL (ref 0.7–3.1)
Lymphs: 30 %
MCH: 30.1 pg (ref 26.6–33.0)
MCHC: 33.2 g/dL (ref 31.5–35.7)
MCV: 91 fL (ref 79–97)
Monocytes Absolute: 0.9 x10E3/uL (ref 0.1–0.9)
Monocytes: 16 %
Neutrophils Absolute: 2.7 x10E3/uL (ref 1.4–7.0)
Neutrophils: 49 %
Platelets: 239 x10E3/uL (ref 150–450)
RBC: 4.72 x10E6/uL (ref 4.14–5.80)
RDW: 13.2 % (ref 11.6–15.4)
WBC: 5.6 x10E3/uL (ref 3.4–10.8)

## 2024-05-26 LAB — HEPATIC FUNCTION PANEL
ALT: 19 IU/L (ref 0–44)
AST: 19 IU/L (ref 0–40)
Albumin: 4.4 g/dL (ref 3.8–4.8)
Alkaline Phosphatase: 88 IU/L (ref 47–123)
Bilirubin Total: 0.2 mg/dL (ref 0.0–1.2)
Bilirubin, Direct: 0.09 mg/dL (ref 0.00–0.40)
Total Protein: 6.9 g/dL (ref 6.0–8.5)

## 2024-05-28 ENCOUNTER — Ambulatory Visit: Payer: Self-pay | Admitting: Podiatry

## 2024-05-28 DIAGNOSIS — Z79899 Other long term (current) drug therapy: Secondary | ICD-10-CM

## 2024-05-28 MED ORDER — TERBINAFINE HCL 250 MG PO TABS: 250.0000 mg | ORAL_TABLET | Freq: Every day | ORAL | 0 refills | Status: AC

## 2024-06-05 ENCOUNTER — Other Ambulatory Visit: Payer: Self-pay | Admitting: Neurosurgery

## 2024-06-05 DIAGNOSIS — D497 Neoplasm of unspecified behavior of endocrine glands and other parts of nervous system: Secondary | ICD-10-CM

## 2024-06-08 ENCOUNTER — Ambulatory Visit: Admitting: Podiatry

## 2024-06-08 DIAGNOSIS — L03032 Cellulitis of left toe: Secondary | ICD-10-CM | POA: Diagnosis not present

## 2024-06-08 DIAGNOSIS — L600 Ingrowing nail: Secondary | ICD-10-CM

## 2024-06-08 MED ORDER — CEPHALEXIN 500 MG PO CAPS: 500.0000 mg | ORAL_CAPSULE | Freq: Three times a day (TID) | ORAL | 0 refills | Status: AC

## 2024-06-08 NOTE — Progress Notes (Signed)
"  °  Subjective:  Patient ID: Kyle Wilcox, male    DOB: [birth year],  MRN: [id number]  Chief Complaint  Patient presents with   Ingrown Toenail    Patient presents today for a follow up on his L third toe, patient reports some soreness.      History of Present Illness Kyle Wilcox is a 74 year old male presents today for Evaluation status post left third ingrown toenail removal.  He states that he was doing well and has been soaking his Epsom salts, antibiotic ointment and also using Aquaphor.  He states he was doing well until his sister ran over his toe with a wheelchair last week and since then has become a little red but is not sore.  No drainage or pus that he reports.      Objective:  There were no vitals filed for this visit.  Physical Exam General: AAO x3, NAD  Dermatological: Status post partial nail avulsion of the left third toenail laterally which partially healed.  Minimal scab is present.  There is some localized edema and erythema in the nail fold mostly proximally but there is no drainage or pus.  No ascending cellulitis.  No fluctuation or crepitation but there is no malodor.  Vascular: Dorsalis Pedis artery and Posterior Tibial artery pedal pulses are 2/4 bilateral with immedate capillary fill time. There is no pain with calf compression, swelling, warmth, erythema.   Neruologic: Grossly intact via light touch bilateral.   Musculoskeletal: No other areas of discomfort.  Hammertoe contractures present.     No images are attached to the encounter.    Results    Assessment:   1. Ingrown toenail      Plan:  Patient was evaluated and treated and all questions answered.  Assessment and Plan Assessment & Plan Ingrown toenail -The procedure site appears to be healing well with excellent localized redness.  There is more from inflammation but given the left start antibiotics for another round.  Refilled cephalexin .  There is no tenderness so hold off on  x-rays.  There is no purulence to culture.  If the erythema is not resolved after the course of antibiotics, or if there is any worsening prior to let us  know otherwise I will see him back on a as needed basis.  He agrees with this plan. Monitor for any clinical signs or symptoms of infection and directed to call the office immediately should any occur or go to the ER.  Return if symptoms worsen or fail to improve.  Donnice JONELLE Fees DPM  "

## 2024-06-08 NOTE — Patient Instructions (Signed)
 Continue soaking in epsom salts twice a day followed by antibiotic ointment and a band-aid. Can leave uncovered at night. Continue this until completely healed.  If the area has not healed in 2 weeks, call the office for follow-up appointment, or sooner if any problems arise.  Monitor for any signs/symptoms of infection. Call the office immediately if any occur or go directly to the emergency room. Call with any questions/concerns.

## 2024-06-14 ENCOUNTER — Ambulatory Visit
Admission: RE | Admit: 2024-06-14 | Discharge: 2024-06-14 | Disposition: A | Discharge status: Home / Self Care | Source: Ambulatory Visit | Attending: Neurosurgery | Admitting: Neurosurgery

## 2024-06-14 DIAGNOSIS — D497 Neoplasm of unspecified behavior of endocrine glands and other parts of nervous system: Secondary | ICD-10-CM

## 2024-06-14 MED ORDER — GADOPICLENOL 0.5 MMOL/ML IV SOLN
8.0000 mL | Freq: Once | INTRAVENOUS | Status: AC | PRN
  Administered 2024-06-14: 8 mL via INTRAVENOUS

## 2024-08-27 ENCOUNTER — Ambulatory Visit: Admitting: Family Medicine
